# Patient Record
Sex: Male | Born: 1965 | Race: White | Hispanic: No | Marital: Married | Smoking: Current every day smoker
Health system: Southern US, Community
[De-identification: ages and names within clinical notes are randomized; demographics above are authoritative.]

## PROBLEM LIST (undated history)

## (undated) DIAGNOSIS — G4730 Sleep apnea, unspecified: Secondary | ICD-10-CM

## (undated) DIAGNOSIS — Z720 Tobacco use: Secondary | ICD-10-CM

## (undated) DIAGNOSIS — R0789 Other chest pain: Secondary | ICD-10-CM

## (undated) DIAGNOSIS — T7840XA Allergy, unspecified, initial encounter: Secondary | ICD-10-CM

## (undated) DIAGNOSIS — I10 Essential (primary) hypertension: Secondary | ICD-10-CM

## (undated) DIAGNOSIS — K219 Gastro-esophageal reflux disease without esophagitis: Secondary | ICD-10-CM

## (undated) DIAGNOSIS — M1990 Unspecified osteoarthritis, unspecified site: Secondary | ICD-10-CM

## (undated) DIAGNOSIS — E669 Obesity, unspecified: Secondary | ICD-10-CM

## (undated) DIAGNOSIS — A4902 Methicillin resistant Staphylococcus aureus infection, unspecified site: Secondary | ICD-10-CM

## (undated) DIAGNOSIS — F32A Depression, unspecified: Secondary | ICD-10-CM

## (undated) DIAGNOSIS — F329 Major depressive disorder, single episode, unspecified: Secondary | ICD-10-CM

## (undated) DIAGNOSIS — Z86010 Personal history of colonic polyps: Secondary | ICD-10-CM

## (undated) HISTORY — DX: Depression, unspecified: F32.A

## (undated) HISTORY — DX: Allergy, unspecified, initial encounter: T78.40XA

## (undated) HISTORY — DX: Major depressive disorder, single episode, unspecified: F32.9

## (undated) HISTORY — DX: Obesity, unspecified: E66.9

## (undated) HISTORY — DX: Other chest pain: R07.89

## (undated) HISTORY — DX: Essential (primary) hypertension: I10

## (undated) HISTORY — PX: TRIGGER FINGER RELEASE: SHX641

## (undated) HISTORY — DX: Tobacco use: Z72.0

## (undated) HISTORY — DX: Personal history of colonic polyps: Z86.010

---

## 2011-05-13 ENCOUNTER — Encounter (HOSPITAL_BASED_OUTPATIENT_CLINIC_OR_DEPARTMENT_OTHER)
Admission: RE | Admit: 2011-05-13 | Discharge: 2011-05-13 | Disposition: A | Discharge status: Home / Self Care | Payer: BC Managed Care – PPO | Source: Ambulatory Visit | Attending: Orthopedic Surgery | Admitting: Orthopedic Surgery

## 2011-05-13 LAB — BASIC METABOLIC PANEL
Calcium: 9.3 mg/dL (ref 8.4–10.5)
Creatinine, Ser: 0.93 mg/dL (ref 0.4–1.5)
GFR calc Af Amer: >60 mL/min (ref >60)
GFR calc non Af Amer: >60 mL/min (ref >60)
Sodium: 136 mEq/L (ref 135–145)

## 2011-05-14 ENCOUNTER — Ambulatory Visit (HOSPITAL_BASED_OUTPATIENT_CLINIC_OR_DEPARTMENT_OTHER)
Admission: RE | Admit: 2011-05-14 | Discharge: 2011-05-14 | Disposition: A | Discharge status: Home / Self Care | Payer: BC Managed Care – PPO | Source: Ambulatory Visit | Attending: Orthopedic Surgery | Admitting: Orthopedic Surgery

## 2011-05-14 DIAGNOSIS — Z01812 Encounter for preprocedural laboratory examination: Secondary | ICD-10-CM | POA: Insufficient documentation

## 2011-05-14 DIAGNOSIS — G4733 Obstructive sleep apnea (adult) (pediatric): Secondary | ICD-10-CM | POA: Insufficient documentation

## 2011-05-14 DIAGNOSIS — M6530 Trigger finger, unspecified finger: Secondary | ICD-10-CM | POA: Insufficient documentation

## 2011-05-14 DIAGNOSIS — M65839 Other synovitis and tenosynovitis, unspecified forearm: Secondary | ICD-10-CM | POA: Insufficient documentation

## 2011-05-14 DIAGNOSIS — K219 Gastro-esophageal reflux disease without esophagitis: Secondary | ICD-10-CM | POA: Insufficient documentation

## 2011-05-14 DIAGNOSIS — I10 Essential (primary) hypertension: Secondary | ICD-10-CM | POA: Insufficient documentation

## 2011-05-14 DIAGNOSIS — F17200 Nicotine dependence, unspecified, uncomplicated: Secondary | ICD-10-CM | POA: Insufficient documentation

## 2011-07-16 NOTE — Op Note (Signed)
  NAMEHIEN, PERREIRA             ACCOUNT NO.:  [id number]  MEDICAL RECORD NO.:  [id number]          PATIENT TYPE:  LOCATION:                                 FACILITY:  PHYSICIAN:  Cindee Salt, M.D.            DATE OF BIRTH:  DATE OF PROCEDURE:  05/14/2011 DATE OF DISCHARGE:                              OPERATIVE REPORT   PREOPERATIVE DIAGNOSIS:  Stenosing tenosynovitis, right middle finger.  POSTOPERATIVE DIAGNOSIS:  Stenosing tenosynovitis, right middle finger.  OPERATION:  Release A1 pulley, right middle finger.  SURGEON:  Cindee Salt, MD  ANESTHESIA:  Forearm based IV regional with local infiltration.  ANESTHESIOLOGIST:  Zenon Mayo, MD  HISTORY:  The patient is a 45 year old male with a history of triggering of his right middle finger.  This did not respond to conservative treatment.  Pre, peri, and postoperative courses have been discussed along with risks and complications.  He is aware there is no guarantee with the surgery, possibility of infection, recurrence, injury to arteries, nerves, tendons, incomplete relief of symptoms, and dystrophy. In the preoperative area, the patient is seen.  The extremity marked by both the patient and surgeon.  Antibiotics given.  PROCEDURE IN DETAIL:  The patient was brought to the operating room where a forearm based IV regional anesthetic was carried out without difficulty.  He was prepped using ChloraPrep, supine position, right arm free.  A 3-minute dry time was allowed.  Time-out taken confirming the patient and procedure.  An oblique incision was made over the A1 pulley of the right middle finger, carried down through the subcutaneous tissue.  Bleeders were electrocauterized.  Dissection was carried down protecting neurovascular bundles radially and ulnarly.  Retractors were placed.  The area of pressure in the tendon was immediately identified. The A1 pulley released.  Small incision made centrally in the A2  pulley. A very significant adherent tenosynovitis was present between superficialis profundus tendon.  This was partially excised.  The wound was copiously irrigated with saline.  The finger was placed through a full range of motion.  No further triggering was noted.  The wound was irrigated.  Skin was closed with interrupted 5-0 Vicryl Rapide sutures. A local infiltration was then given with 0.25% Marcaine without epinephrine, approximately 5 mL was used.  On deflation of the tourniquet, all fingers were immediately pinked.  After placement of a sterile compressive dressing with all the fingers free having been applied.  The patient tolerated the procedure well and was taken to the recovery room for observation in satisfactory condition.  He will be discharged home to return to the [facility] in 1 week on Vicodin.          ______________________________ Cindee Salt, M.D.     GK/MEDQ  D:  05/14/2011  T:  05/15/2011  Job:  [id number]  Electronically Signed by Cindee Salt M.D. on 07/16/2011 09:13:43 AM

## 2012-12-27 HISTORY — PX: MOUTH SURGERY: SHX715

## 2013-11-17 ENCOUNTER — Emergency Department (HOSPITAL_COMMUNITY): Payer: BC Managed Care – PPO

## 2013-11-17 ENCOUNTER — Encounter (HOSPITAL_COMMUNITY): Payer: Self-pay | Admitting: Emergency Medicine

## 2013-11-17 ENCOUNTER — Encounter (HOSPITAL_COMMUNITY): Payer: BC Managed Care – PPO | Admitting: Anesthesiology

## 2013-11-17 ENCOUNTER — Emergency Department (HOSPITAL_COMMUNITY): Payer: BC Managed Care – PPO | Admitting: Anesthesiology

## 2013-11-17 ENCOUNTER — Inpatient Hospital Stay (HOSPITAL_COMMUNITY)
Admission: EM | Admit: 2013-11-17 | Discharge: 2013-11-25 | DRG: 494 | Disposition: A | Discharge status: Home Health | Payer: BC Managed Care – PPO | Attending: Emergency Medicine | Admitting: Emergency Medicine

## 2013-11-17 ENCOUNTER — Encounter (HOSPITAL_COMMUNITY): Admission: EM | Disposition: A | Discharge status: Home Health | Payer: Self-pay | Source: Home / Self Care

## 2013-11-17 DIAGNOSIS — Z4789 Encounter for other orthopedic aftercare: Secondary | ICD-10-CM

## 2013-11-17 DIAGNOSIS — Z79899 Other long term (current) drug therapy: Secondary | ICD-10-CM

## 2013-11-17 DIAGNOSIS — F17200 Nicotine dependence, unspecified, uncomplicated: Secondary | ICD-10-CM | POA: Diagnosis present

## 2013-11-17 DIAGNOSIS — W138XXA Fall from, out of or through other building or structure, initial encounter: Secondary | ICD-10-CM | POA: Diagnosis present

## 2013-11-17 DIAGNOSIS — S82109A Unspecified fracture of upper end of unspecified tibia, initial encounter for closed fracture: Principal | ICD-10-CM | POA: Diagnosis present

## 2013-11-17 DIAGNOSIS — S82143A Displaced bicondylar fracture of unspecified tibia, initial encounter for closed fracture: Secondary | ICD-10-CM

## 2013-11-17 DIAGNOSIS — S82141A Displaced bicondylar fracture of right tibia, initial encounter for closed fracture: Secondary | ICD-10-CM

## 2013-11-17 DIAGNOSIS — I10 Essential (primary) hypertension: Secondary | ICD-10-CM | POA: Diagnosis present

## 2013-11-17 HISTORY — PX: EXTERNAL FIXATION LEG: SHX1549

## 2013-11-17 HISTORY — DX: Essential (primary) hypertension: I10

## 2013-11-17 LAB — CBC WITH DIFFERENTIAL/PLATELET
Basophils Absolute: 0 10*3/uL (ref 0.0–0.1)
Eosinophils Absolute: 0.1 10*3/uL (ref 0.0–0.7)
Lymphocytes Relative: 13 % (ref 12–46)
Lymphs Abs: 1.8 10*3/uL (ref 0.7–4.0)
Neutrophils Relative %: 81 % — ABNORMAL HIGH (ref 43–77)
Platelets: 216 10*3/uL (ref 150–400)
RBC: 4.8 MIL/uL (ref 4.22–5.81)
RDW: 12.6 % (ref 11.5–15.5)
WBC: 14 10*3/uL — ABNORMAL HIGH (ref 4.0–10.5)

## 2013-11-17 LAB — BASIC METABOLIC PANEL
CO2: 22 mEq/L (ref 19–32)
Calcium: 8.7 mg/dL (ref 8.4–10.5)
GFR calc non Af Amer: 78 mL/min — ABNORMAL LOW (ref >90)
Glucose, Bld: 97 mg/dL (ref 70–99)
Potassium: 3.9 mEq/L (ref 3.5–5.1)
Sodium: 135 mEq/L (ref 135–145)

## 2013-11-17 SURGERY — EXTERNAL FIXATION, LOWER EXTREMITY
Anesthesia: General | Site: Leg Upper | Laterality: Right | Wound class: Clean

## 2013-11-17 MED ORDER — ONDANSETRON HCL 4 MG/2ML IJ SOLN
INTRAMUSCULAR | Status: DC | PRN
  Administered 2013-11-17: 4 mg via INTRAVENOUS

## 2013-11-17 MED ORDER — ROCURONIUM BROMIDE 100 MG/10ML IV SOLN
INTRAVENOUS | Status: AC
  Filled 2013-11-17: qty 1

## 2013-11-17 MED ORDER — 0.9 % SODIUM CHLORIDE (POUR BTL) OPTIME
TOPICAL | Status: DC | PRN
  Administered 2013-11-17: 1000 mL

## 2013-11-17 MED ORDER — EPHEDRINE SULFATE 50 MG/ML IJ SOLN
INTRAMUSCULAR | Status: DC | PRN
  Administered 2013-11-17: 10 mg via INTRAVENOUS

## 2013-11-17 MED ORDER — LISINOPRIL 5 MG PO TABS
5.0000 mg | ORAL_TABLET | Freq: Every morning | ORAL | Status: DC
  Administered 2013-11-18 – 2013-11-25 (×7): 5 mg via ORAL
  Filled 2013-11-17 (×8): qty 1

## 2013-11-17 MED ORDER — MIDAZOLAM HCL 2 MG/2ML IJ SOLN
INTRAMUSCULAR | Status: AC
  Filled 2013-11-17: qty 2

## 2013-11-17 MED ORDER — DIPHENHYDRAMINE HCL 12.5 MG/5ML PO ELIX: 25.0000 mg | ORAL_SOLUTION | ORAL | Status: DC | PRN

## 2013-11-17 MED ORDER — CEFAZOLIN SODIUM-DEXTROSE 2-3 GM-% IV SOLR
2.0000 g | Freq: Three times a day (TID) | INTRAVENOUS | Status: DC
  Administered 2013-11-17: 2 g via INTRAVENOUS
  Filled 2013-11-17 (×3): qty 50

## 2013-11-17 MED ORDER — METOCLOPRAMIDE HCL 10 MG PO TABS: 5.0000 mg | ORAL_TABLET | Freq: Three times a day (TID) | ORAL | Status: DC | PRN

## 2013-11-17 MED ORDER — MIDAZOLAM HCL 5 MG/5ML IJ SOLN
INTRAMUSCULAR | Status: DC | PRN
  Administered 2013-11-17: 2 mg via INTRAVENOUS

## 2013-11-17 MED ORDER — HYDROMORPHONE HCL PF 1 MG/ML IJ SOLN
1.0000 mg | Freq: Once | INTRAMUSCULAR | Status: AC
  Administered 2013-11-17: 1 mg via INTRAVENOUS
  Filled 2013-11-17: qty 1

## 2013-11-17 MED ORDER — MORPHINE SULFATE 2 MG/ML IJ SOLN
1.0000 mg | INTRAMUSCULAR | Status: DC | PRN
  Administered 2013-11-17 – 2013-11-20 (×4): 1 mg via INTRAVENOUS
  Filled 2013-11-17 (×4): qty 1

## 2013-11-17 MED ORDER — LABETALOL HCL 5 MG/ML IV SOLN
10.0000 mg | INTRAVENOUS | Status: AC | PRN
  Administered 2013-11-17 (×2): 2.5 mg via INTRAVENOUS

## 2013-11-17 MED ORDER — FENTANYL CITRATE 0.05 MG/ML IJ SOLN
INTRAMUSCULAR | Status: DC | PRN
  Administered 2013-11-17: 150 ug via INTRAVENOUS
  Administered 2013-11-17: 100 ug via INTRAVENOUS

## 2013-11-17 MED ORDER — PROMETHAZINE HCL 25 MG/ML IJ SOLN: 6.2500 mg | INTRAMUSCULAR | Status: DC | PRN

## 2013-11-17 MED ORDER — SODIUM CHLORIDE 0.9 % IV SOLN
INTRAVENOUS | Status: DC
  Administered 2013-11-17 – 2013-11-19 (×4): via INTRAVENOUS

## 2013-11-17 MED ORDER — LIDOCAINE HCL (CARDIAC) 20 MG/ML IV SOLN
INTRAVENOUS | Status: AC
  Filled 2013-11-17: qty 5

## 2013-11-17 MED ORDER — SUCCINYLCHOLINE CHLORIDE 20 MG/ML IJ SOLN
INTRAMUSCULAR | Status: AC
  Filled 2013-11-17: qty 2

## 2013-11-17 MED ORDER — ASPIRIN EC 325 MG PO TBEC
325.0000 mg | DELAYED_RELEASE_TABLET | Freq: Two times a day (BID) | ORAL | Status: DC
  Administered 2013-11-18 – 2013-11-19 (×4): 325 mg via ORAL
  Filled 2013-11-17 (×7): qty 1

## 2013-11-17 MED ORDER — NALOXONE NEWBORN-WH INJECTION 0.4 MG/ML
INTRAMUSCULAR | Status: DC | PRN
  Administered 2013-11-17: .08 mg via INTRAMUSCULAR

## 2013-11-17 MED ORDER — CEFAZOLIN SODIUM-DEXTROSE 2-3 GM-% IV SOLR
INTRAVENOUS | Status: AC
  Filled 2013-11-17: qty 50

## 2013-11-17 MED ORDER — ONDANSETRON HCL 4 MG/2ML IJ SOLN
INTRAMUSCULAR | Status: AC
  Filled 2013-11-17: qty 2

## 2013-11-17 MED ORDER — PROPOFOL 10 MG/ML IV BOLUS
INTRAVENOUS | Status: DC | PRN
  Administered 2013-11-17: 200 mg via INTRAVENOUS

## 2013-11-17 MED ORDER — MAGNESIUM CITRATE PO SOLN: 1.0000 | Freq: Once | ORAL | Status: AC | PRN

## 2013-11-17 MED ORDER — OXYCODONE HCL 5 MG PO TABS
5.0000 mg | ORAL_TABLET | ORAL | Status: DC | PRN
  Administered 2013-11-18: 15:00:00 10 mg via ORAL
  Filled 2013-11-17: qty 2

## 2013-11-17 MED ORDER — SODIUM CHLORIDE 0.9 % IV BOLUS (SEPSIS)
500.0000 mL | Freq: Once | INTRAVENOUS | Status: AC
  Administered 2013-11-17: 500 mL via INTRAVENOUS

## 2013-11-17 MED ORDER — HYDROMORPHONE HCL PF 1 MG/ML IJ SOLN
0.2500 mg | INTRAMUSCULAR | Status: DC | PRN
  Administered 2013-11-17 (×4): 0.5 mg via INTRAVENOUS

## 2013-11-17 MED ORDER — CEFAZOLIN SODIUM-DEXTROSE 2-3 GM-% IV SOLR
2.0000 g | Freq: Four times a day (QID) | INTRAVENOUS | Status: AC
Start: 2013-11-17 — End: 2013-11-18
  Administered 2013-11-18 (×3): 2 g via INTRAVENOUS
  Filled 2013-11-17 (×3): qty 50

## 2013-11-17 MED ORDER — NALOXONE HCL 0.4 MG/ML IJ SOLN
INTRAMUSCULAR | Status: AC
  Filled 2013-11-17: qty 1

## 2013-11-17 MED ORDER — METHOCARBAMOL 100 MG/ML IJ SOLN
500.0000 mg | Freq: Four times a day (QID) | INTRAVENOUS | Status: DC | PRN
  Administered 2013-11-17 – 2013-11-18 (×2): 500 mg via INTRAVENOUS
  Filled 2013-11-17 (×2): qty 5

## 2013-11-17 MED ORDER — POLYETHYLENE GLYCOL 3350 17 G PO PACK
17.0000 g | PACK | Freq: Every day | ORAL | Status: DC | PRN
  Administered 2013-11-18 – 2013-11-20 (×2): 17 g via ORAL
  Filled 2013-11-17 (×2): qty 1

## 2013-11-17 MED ORDER — LACTATED RINGERS IV SOLN
INTRAVENOUS | Status: DC | PRN
  Administered 2013-11-17: 17:00:00 via INTRAVENOUS

## 2013-11-17 MED ORDER — LABETALOL HCL 5 MG/ML IV SOLN
INTRAVENOUS | Status: AC
  Administered 2013-11-17: 5 mg
  Filled 2013-11-17: qty 4

## 2013-11-17 MED ORDER — SENNA 8.6 MG PO TABS
1.0000 | ORAL_TABLET | Freq: Two times a day (BID) | ORAL | Status: DC
  Administered 2013-11-17 – 2013-11-25 (×12): 8.6 mg via ORAL
  Filled 2013-11-17 (×14): qty 1

## 2013-11-17 MED ORDER — ROCURONIUM BROMIDE 100 MG/10ML IV SOLN
INTRAVENOUS | Status: DC | PRN
  Administered 2013-11-17: 2 mg via INTRAVENOUS

## 2013-11-17 MED ORDER — SUCCINYLCHOLINE CHLORIDE 20 MG/ML IJ SOLN
INTRAMUSCULAR | Status: AC
  Filled 2013-11-17: qty 1

## 2013-11-17 MED ORDER — HYDROCODONE-ACETAMINOPHEN 5-325 MG PO TABS
1.0000 | ORAL_TABLET | ORAL | Status: DC | PRN
  Administered 2013-11-17 – 2013-11-19 (×8): 2 via ORAL
  Filled 2013-11-17 (×8): qty 2

## 2013-11-17 MED ORDER — LIDOCAINE HCL (PF) 2 % IJ SOLN
INTRAMUSCULAR | Status: DC | PRN
  Administered 2013-11-17: 75 mg via INTRADERMAL

## 2013-11-17 MED ORDER — METHOCARBAMOL 500 MG PO TABS
500.0000 mg | ORAL_TABLET | Freq: Four times a day (QID) | ORAL | Status: DC | PRN
  Administered 2013-11-18 – 2013-11-22 (×7): 500 mg via ORAL
  Filled 2013-11-17 (×7): qty 1

## 2013-11-17 MED ORDER — SUCCINYLCHOLINE CHLORIDE 20 MG/ML IJ SOLN
INTRAMUSCULAR | Status: DC | PRN
  Administered 2013-11-17: 160 mg via INTRAVENOUS

## 2013-11-17 MED ORDER — HYDROMORPHONE HCL PF 1 MG/ML IJ SOLN
INTRAMUSCULAR | Status: AC
  Filled 2013-11-17: qty 1

## 2013-11-17 MED ORDER — PROPOFOL 10 MG/ML IV BOLUS
INTRAVENOUS | Status: AC
  Filled 2013-11-17: qty 20

## 2013-11-17 MED ORDER — ONDANSETRON HCL 4 MG/2ML IJ SOLN: 4.0000 mg | Freq: Four times a day (QID) | INTRAMUSCULAR | Status: DC | PRN

## 2013-11-17 MED ORDER — SORBITOL 70 % SOLN
30.0000 mL | Freq: Every day | Status: DC | PRN
  Filled 2013-11-17: qty 30

## 2013-11-17 MED ORDER — FENTANYL CITRATE 0.05 MG/ML IJ SOLN
INTRAMUSCULAR | Status: AC
  Filled 2013-11-17: qty 5

## 2013-11-17 MED ORDER — FENTANYL CITRATE 0.05 MG/ML IJ SOLN: 25.0000 ug | INTRAMUSCULAR | Status: DC | PRN

## 2013-11-17 MED ORDER — PANTOPRAZOLE SODIUM 40 MG PO TBEC
40.0000 mg | DELAYED_RELEASE_TABLET | Freq: Every day | ORAL | Status: DC
  Administered 2013-11-18 – 2013-11-23 (×6): 40 mg via ORAL
  Filled 2013-11-17 (×7): qty 1

## 2013-11-17 MED ORDER — ONDANSETRON HCL 4 MG PO TABS: 4.0000 mg | ORAL_TABLET | Freq: Four times a day (QID) | ORAL | Status: DC | PRN

## 2013-11-17 MED ORDER — ONDANSETRON HCL 4 MG/2ML IJ SOLN
4.0000 mg | Freq: Once | INTRAMUSCULAR | Status: DC
  Filled 2013-11-17: qty 2

## 2013-11-17 MED ORDER — METOCLOPRAMIDE HCL 5 MG/ML IJ SOLN: 5.0000 mg | Freq: Three times a day (TID) | INTRAMUSCULAR | Status: DC | PRN

## 2013-11-17 SURGICAL SUPPLY — 24 items
3.5 DRILL ×2 IMPLANT
5x401/2 pin long IMPLANT
5x401/2 pin short IMPLANT
BANDAGE GAUZE ELAST BULKY 4 IN (GAUZE/BANDAGES/DRESSINGS) ×2 IMPLANT
BAR EXFIX SM BONE 10.5X400 (Trauma) ×4 IMPLANT
BIT DRILL 3.5 SHOFT HALF PIN (BIT) ×2 IMPLANT
CLAMP BAR TO BAR (Clamp) ×4 IMPLANT
CLAMP PIN TO BAR (Clamp) ×8 IMPLANT
DRAPE C-ARMOR (DRAPES) ×2 IMPLANT
DRAPE ORTHO SPLIT 77X108 STRL (DRAPES) ×2
DRAPE SURG ORHT 6 SPLT 77X108 (DRAPES) ×2 IMPLANT
DRAPE U-SHAPE 47X51 STRL (DRAPES) ×2 IMPLANT
GAUZE XEROFORM 5X9 LF (GAUZE/BANDAGES/DRESSINGS) ×2 IMPLANT
GOWN PREVENTION PLUS XXLARGE (GOWN DISPOSABLE) ×2 IMPLANT
GOWN SRG XL XLNG 56XLVL 4 (GOWN DISPOSABLE) ×1 IMPLANT
GOWN STRL NON-REIN LRG LVL3 (GOWN DISPOSABLE) ×2 IMPLANT
GOWN STRL NON-REIN XL XLG LVL4 (GOWN DISPOSABLE) ×1
NDL SAFETY ECLIPSE 18X1.5 (NEEDLE) ×1 IMPLANT
NEEDLE HYPO 18GX1.5 SHARP (NEEDLE) ×1
PACK TOTAL JOINT (CUSTOM PROCEDURE TRAY) ×2 IMPLANT
PIN HALF 5X40 (EXFIX) ×8 IMPLANT
SYR 50ML LL SCALE MARK (SYRINGE) ×2 IMPLANT
TOWEL NATURAL 10PK STERILE (DISPOSABLE) ×4 IMPLANT
TOWEL OR NON WOVEN STRL DISP B (DISPOSABLE) ×2 IMPLANT

## 2013-11-17 NOTE — ED Notes (Addendum)
Pt was on stepping from ladder onto roof and ladder moved. Pt fell 10 feet to ground. Bumped air conditioner on during fall. Pt has deformed R knee. Denies hitting head or LOC. States he landed on feet. EMS gave pt fentanyl and 4mg  zofran. Pt

## 2013-11-17 NOTE — Op Note (Signed)
   Date of Surgery: 11/17/2013  INDICATIONS: Roger Ayers is a 47 y.o.-year-old male who sustained a right tibial plateau fracture; he was indicated for external fixation due to the displaced and unstable nature of the fracture and came to the operating room today for this procedure. The patient did consent to the procedure after discussion of the risks and benefits.   PREOPERATIVE DIAGNOSIS: right bicondylar tibial plateau fracture   POSTOPERATIVE DIAGNOSIS: Same.  PROCEDURE: External fixation right tibial plateau fracture multiplane;  SURGEON: Roger Ayers, M.D.  ASSIST: none,   ANESTHESIA: general  IV FLUIDS AND URINE: See anesthesia.  ESTIMATED BLOOD LOSS: minimal mL.  IMPLANTS: S&N 5.0 mm Schanz pins and external fixation bars and clamps  DRAINS: None.  COMPLICATIONS: None.  DESCRIPTION OF PROCEDURE: The patient was brought to the operating room and placed supine on the operating table.  The patient had been signed prior to the procedure.  The patient had the anesthesia placed by the anesthesiologist.  The prep verification and incision time-outs were performed to confirm that this was the correct patient, site, side and location. The patient had SCDs in place on the opposite lower extremity. The patient did receive antibiotics prior to the incision and was re-dosed during the procedure as needed at indicated intervals.  The lower extremity was prepped and draped in the standard fashion.  The bony landmarks were palpated and the pin sites were marked on the skin. Each Schanz pin was placed in the same fashion -- first drilling with the 3.5 mm drill while copiously irrigating, then hand placing the pin.  This was confirmed on x-ray on both views.  The ex-fix clamps were placed onto pins and the fracture was pulled into the proper alignment.  The clamps were completely tightened.   Final x-rays were taken in AP and lateral views to confirm the reduction and pin lengths. The wounds  were cleaned and dried a final time and a sterile dressing consisting of Xeroform and ABDs was placed. The patient was then transferred back to the bed and left the operating room in stable condition.  All sponge and instrument counts were correct.  POSTOPERATIVE PLAN: Roger Ayers will remain non weight bearing with the leg elevated.  he will return to the operating room for definitive fixation when the swelling has gone down.  Roger Ayers will receive DVT prophylaxis based on other medications, activity level, and risk ratio of bleeding to thrombosis.  Pin site care will be initiated on postoperative day one.

## 2013-11-17 NOTE — Anesthesia Postprocedure Evaluation (Signed)
  Anesthesia Post-op Note  Patient: Roger Ayers  Procedure(s) Performed: Procedure(s) (LRB): EXTERNAL FIXATION LEG (Right)  Patient Location: PACU  Anesthesia Type: General  Level of Consciousness: awake and alert   Airway and Oxygen Therapy: Patient Spontanous Breathing  Post-op Pain: mild  Post-op Assessment: Post-op Vital signs reviewed, Patient's Cardiovascular Status Stable, Respiratory Function Stable, Patent Airway and No signs of Nausea or vomiting  Last Vitals:  Filed Vitals:   11/17/13 1945  BP: 158/91  Pulse: 95  Temp:   Resp: 10    Post-op Vital Signs: stable   Complications: No apparent anesthesia complications. To floor with sleep apnea precautions and CPAP.

## 2013-11-17 NOTE — Transfer of Care (Signed)
Immediate Anesthesia Transfer of Care Note  Patient: Roger Ayers  Procedure(s) Performed: Procedure(s): EXTERNAL FIXATION LEG (Right)  Patient Location: PACU  Anesthesia Type:General  Level of Consciousness: sedated  Airway & Oxygen Therapy: Patient Spontanous Breathing and Patient connected to face mask oxygen  Post-op Assessment: Report given to PACU RN and Post -op Vital signs reviewed and stable  Post vital signs: Reviewed and stable  Complications: No apparent anesthesia complications

## 2013-11-17 NOTE — Anesthesia Preprocedure Evaluation (Addendum)
Anesthesia Evaluation  Patient identified by MRN, date of birth, ID band Patient awake    Reviewed: Allergy & Precautions, H&P , NPO status , Patient's Chart, lab work & pertinent test results  Airway Mallampati: II TM Distance: >3 FB Neck ROM: Full    Dental no notable dental hx.    Pulmonary sleep apnea and Continuous Positive Airway Pressure Ventilation , Current Smoker,  CXR: No active disease. breath sounds clear to auscultation  Pulmonary exam normal       Cardiovascular hypertension, Pt. on medications negative cardio ROS  Rhythm:Regular Rate:Normal     Neuro/Psych negative neurological ROS  negative psych ROS   GI/Hepatic negative GI ROS, Neg liver ROS,   Endo/Other  negative endocrine ROS  Renal/GU negative Renal ROS  negative genitourinary   Musculoskeletal negative musculoskeletal ROS (+)   Abdominal   Peds negative pediatric ROS (+)  Hematology negative hematology ROS (+)   Anesthesia Other Findings   Reproductive/Obstetrics negative OB ROS                          Anesthesia Physical Anesthesia Plan  ASA: II and emergent  Anesthesia Plan: General   Post-op Pain Management:    Induction: Intravenous  Airway Management Planned: Oral ETT  Additional Equipment:   Intra-op Plan:   Post-operative Plan: Extubation in OR  Informed Consent: I have reviewed the patients History and Physical, chart, labs and discussed the procedure including the risks, benefits and alternatives for the proposed anesthesia with the patient or authorized representative who has indicated his/her understanding and acceptance.   Dental advisory given  Plan Discussed with: CRNA  Anesthesia Plan Comments:         Anesthesia Quick Evaluation

## 2013-11-17 NOTE — ED Notes (Signed)
Bed: WA20 Expected date:  Expected time:  Means of arrival:  Comments: EMS-fall from ladder-knee deformity

## 2013-11-17 NOTE — Progress Notes (Signed)
Spoke with pt regarding cpap.  Pt brought in his cpap unit from home with tubing and nasal pillows.   Machine turned on and appears to be in working order.  No frays on cord or obvious defects noted at this time.  Pt stated he feels comfortable placing it on himself later when ready.  Pt was advised that RT is available all night should he need further assistance to let his nurse know.  RN aware.  Call will be placed through service response requesting [employer] inspect home cpap machine.

## 2013-11-17 NOTE — ED Notes (Signed)
Patient transported to X-ray 

## 2013-11-17 NOTE — Consult Note (Signed)
ORTHOPAEDIC CONSULTATION  REQUESTING PHYSICIAN: Roney Marion, MD  Chief Complaint: Right knee injury  HPI: Roger Ayers is a 47 y.o. male who complains of right knee pain s/p fall from 10 ft from ladder and landing on his feet.  Had immediate onset of pain and inability to bear weight.  Denies any other complaints.  Denies LOC, neck pain, abd pain.  Past Medical History  Diagnosis Date  . Hypertension    Past Surgical History  Procedure Laterality Date  . Mouth surgery    . Trigger finger release     History   Social History  . Marital Status: Unknown    Spouse Name: N/A    Number of Children: N/A  . Years of Education: N/A   Social History Main Topics  . Smoking status: Current Every Day Smoker  . Smokeless tobacco: None  . Alcohol Use: Yes  . Drug Use: No  . Sexual Activity: None   Other Topics Concern  . None   Social History Narrative  . None   History reviewed. No pertinent family history. No Known Allergies Prior to Admission medications   Medication Sig Start Date End Date Taking? Authorizing Provider  lisinopril (PRINIVIL,ZESTRIL) 5 MG tablet Take 5 mg by mouth every morning.   Yes Historical Provider, MD  omeprazole (PRILOSEC) 20 MG capsule Take 20 mg by mouth every morning.   Yes Historical Provider, MD   Ct Knee Right Wo Contrast  11/17/2013   CLINICAL DATA:  Right knee tibial plateau fracture  EXAM: CT OF THE RIGHT KNEE WITHOUT CONTRAST  TECHNIQUE: Multidetector CT imaging was performed according to the standard protocol. Multiplanar CT image reconstructions were also generated.  COMPARISON:  Radiographic series dated 11/17/2013  FINDINGS: A comminuted fracture is identified involving the lateral tibial plateau. Areas of distraction appreciated maximal diameter of 3.5 cm. There are areas of depression in the posterior aspect of fracture remaining tooth of approximately 1 cm. The fracture extends into the tibial spine region as well as into the medial  tibial plateau region. The medial tibial plateau fracture is also comminuted and demonstrates minimal depression of 2-3 mm. There is a vertical component extending into the proximal tibial shaft. An impacted comminuted fibular head fracture is appreciated. There does not appear to be significant displacement.  IMPRESSION: 1. Lateral and medial tibial plateau fractures with distraction and areas of depression. 2. Comminuted fibular head fracture.   Electronically Signed   By: Salome Holmes M.D.   On: 11/17/2013 16:16   Dg Chest Port 1 View  11/17/2013   CLINICAL DATA:  Hypertension.  EXAM: PORTABLE CHEST - 1 VIEW  COMPARISON:  None.  FINDINGS: The heart size and mediastinal contours are within normal limits. Both lungs are clear. The visualized skeletal structures are unremarkable.  IMPRESSION: No active disease.   Electronically Signed   By: Roque Lias M.D.   On: 11/17/2013 15:35   Dg Knee Complete 4 Views Right  11/17/2013   CLINICAL DATA:  History of trauma.  EXAM: RIGHT KNEE - COMPLETE 4+ VIEW  COMPARISON:  None.  FINDINGS: A comminuted lateral tibial plateau fracture identified demonstrating a next anterior/ is lateral displacement, 3 cm of distraction, mild depression, intra-articular extension is. The fracture line appears extending from the lateral tibial spine to the peripheral cortical base. An impacted comminuted fracture identified involving the proximal fibula.  IMPRESSION: 1. Lateral tibial plateau fracture 2. Comminuted impacted proximal fibular head fracture.   Electronically Signed  By: Salome Holmes M.D.   On: 11/17/2013 14:39   Dg Hand Complete Left  11/17/2013   CLINICAL DATA:  Fall.  EXAM: LEFT HAND - COMPLETE 3+ VIEW  COMPARISON:  None.  FINDINGS: There is no evidence of fracture or dislocation. There is no evidence of arthropathy or other focal bone abnormality. Soft tissues are unremarkable.  IMPRESSION: Negative.   Electronically Signed   By: Charlett Nose M.D.   On:  11/17/2013 16:31    Positive ROS: All other systems have been reviewed and were otherwise negative with the exception of those mentioned in the HPI and as above.  Physical Exam: General: Alert, no acute distress Cardiovascular: No pedal edema Respiratory: No cyanosis, no use of accessory musculature GI: No organomegaly, abdomen is soft and non-tender Skin: No lesions in the area of chief complaint Neurologic: Sensation intact distally Psychiatric: Patient is competent for consent with normal mood and affect Lymphatic: No axillary or cervical lymphadenopathy  MUSCULOSKELETAL:   RLE: Compartment soft Deformity of knee NVI distally  Assessment: Right bicondylar tibial plateau fracture  Plan: - NPO - plan for ex fix today - will admit after surgery - will plan for definitive fixation next week - discussed r/b/a to surgery and patient elects to proceed with surgery  Thank you for the consult and the opportunity to see Roger Ayers. Glee Arvin, MD [facility] [phone] 4:47 PM

## 2013-11-17 NOTE — ED Notes (Signed)
MD at bedside. 

## 2013-11-17 NOTE — ED Provider Notes (Signed)
CSN: [id number]     Arrival date & time 11/17/13  1404 History   First MD Initiated Contact with Patient 11/17/13 1417     Chief Complaint  Patient presents with  . Knee Injury  . Fall   (Consider location/radiation/quality/duration/timing/severity/associated sxs/prior Treatment) HPI   Naseer Hearn is a 47 y.o. male brought in by EMS with severe pain to the right knee after patient fell 10 feet off a roof when his ladder slipped. Initially the patient impacted a wooden fence, he then came into contact contact with landscape ground which consisted of rocks and dirt. Patient rates his pain as 7/10, EMS gave 200 mcg of fentanyl in route. Said that this helped significantly. Patient denies any head trauma, LOC, nausea vomiting, change in vision, cervicalgia, chest pain, shortness of breath, abdominal pain.   Past Medical History  Diagnosis Date  . Hypertension    Past Surgical History  Procedure Laterality Date  . Mouth surgery    . Trigger finger release     History reviewed. No pertinent family history. History  Substance Use Topics  . Smoking status: Current Every Day Smoker  . Smokeless tobacco: Not on file  . Alcohol Use: Yes    Review of Systems  10 systems reviewed and found to be negative, except as noted in the HPI    Allergies  Review of patient's allergies indicates no known allergies.  Home Medications   Current Outpatient Rx  Name  Route  Sig  Dispense  Refill  . lisinopril (PRINIVIL,ZESTRIL) 5 MG tablet   Oral   Take 5 mg by mouth every morning.         Marland Kitchen omeprazole (PRILOSEC) 20 MG capsule   Oral   Take 20 mg by mouth every morning.          BP 138/76  Pulse 80  Temp(Src) 98.3 F (36.8 C) (Oral)  Resp 16  SpO2 95% Physical Exam  Nursing note and vitals reviewed. Constitutional: He is oriented to person, place, and time. He appears well-developed and well-nourished. No distress.  HENT:  Head: Normocephalic and atraumatic.  Mouth/Throat:  Oropharynx is clear and moist.  Eyes: Conjunctivae and EOM are normal. Pupils are equal, round, and reactive to light.  Neck: Normal range of motion.  No midline tenderness to palpation or step-offs appreciated. Patient has full range of motion without pain.   Cardiovascular: Normal rate, regular rhythm and normal heart sounds.   Pulmonary/Chest: Effort normal and breath sounds normal. No stridor. No respiratory distress. He has no wheezes. He has no rales. He exhibits no tenderness.  No ecchymoses or abrasions, no tenderness to palpation or crepitance.  Abdominal: Soft. Bowel sounds are normal. He exhibits no distension and no mass. There is no tenderness. There is no rebound and no guarding.  No ecchymoses or abrasion, nontender to palpation. No pain with pelvic rocking  Musculoskeletal: Normal range of motion.  Swelling and tenderness to palpation at the area just inferior to the right knee on the lateral side. Patient has excellent range of motion to toes, cap refill is less than 2 seconds and dorsalis pedis is 2+ bilaterally.  Compartment is nontender.  Neurological: He is alert and oriented to person, place, and time.  Psychiatric: He has a normal mood and affect.    ED Course  Procedures (including critical care time) Labs Review Labs Reviewed  CBC WITH DIFFERENTIAL - Abnormal; Notable for the following:    WBC 14.0 (*)    Neutrophils  Relative % 81 (*)    Neutro Abs 11.4 (*)    All other components within normal limits  BASIC METABOLIC PANEL - Abnormal; Notable for the following:    GFR calc non Af Amer 78 (*)    All other components within normal limits  BASIC METABOLIC PANEL - Abnormal; Notable for the following:    Glucose, Bld 156 (*)    GFR calc non Af Amer 83 (*)    All other components within normal limits   Imaging Review Dg Knee Complete 4 Views Right  11/17/2013   CLINICAL DATA:  History of trauma.  EXAM: RIGHT KNEE - COMPLETE 4+ VIEW  COMPARISON:  None.   FINDINGS: A comminuted lateral tibial plateau fracture identified demonstrating a next anterior/ is lateral displacement, 3 cm of distraction, mild depression, intra-articular extension is. The fracture line appears extending from the lateral tibial spine to the peripheral cortical base. An impacted comminuted fracture identified involving the proximal fibula.  IMPRESSION: 1. Lateral tibial plateau fracture 2. Comminuted impacted proximal fibular head fracture.   Electronically Signed   By: Salome Holmes M.D.   On: 11/17/2013 14:39    EKG Interpretation   None       MDM   1. Tibial plateau fracture, right, closed, initial encounter      Filed Vitals:   11/17/13 1410  BP: 138/76  Pulse: 80  Temp: 98.3 F (36.8 C)  TempSrc: Oral  Resp: 16  SpO2: 95%     Stan Cantave is a 47 y.o. male  with right-sided tibial plateau fracture status post fall off a roof. And orthopedic consult from Dr. Roda Shutters appreciated: He recommends obtaining CT scan he will come in to evaluate the patient. Case signed out to PA Parker at shift change.   Medications  ondansetron (ZOFRAN) injection 4 mg (not administered)  HYDROmorphone (DILAUDID) injection 1 mg (1 mg Intravenous Given 11/17/13 1503)  sodium chloride 0.9 % bolus 500 mL (500 mLs Intravenous New Bag/Given 11/17/13 1503)    Note: Portions of this report may have been transcribed using voice recognition software. Every effort was made to ensure accuracy; however, inadvertent computerized transcription errors may be present     Wynetta Emery, PA-C 11/18/13 [phone]

## 2013-11-18 LAB — BASIC METABOLIC PANEL
BUN: 10 mg/dL (ref 6–23)
Creatinine, Ser: 1.05 mg/dL (ref 0.50–1.35)
GFR calc Af Amer: >90 mL/min (ref >90)
GFR calc non Af Amer: 83 mL/min — ABNORMAL LOW (ref >90)
Glucose, Bld: 156 mg/dL — ABNORMAL HIGH (ref 70–99)
Potassium: 3.8 mEq/L (ref 3.5–5.1)
Sodium: 136 mEq/L (ref 135–145)

## 2013-11-18 MED ORDER — HYDROMORPHONE HCL 2 MG PO TABS
2.0000 mg | ORAL_TABLET | ORAL | Status: DC | PRN
  Administered 2013-11-18 – 2013-11-21 (×18): 4 mg via ORAL
  Administered 2013-11-21: 16:00:00 2 mg via ORAL
  Administered 2013-11-22 – 2013-11-24 (×15): 4 mg via ORAL
  Filled 2013-11-18 (×35): qty 2

## 2013-11-18 MED ORDER — HYDRALAZINE HCL 20 MG/ML IJ SOLN
10.0000 mg | INTRAMUSCULAR | Status: DC | PRN
  Administered 2013-11-18 – 2013-11-20 (×3): 10 mg via INTRAVENOUS
  Filled 2013-11-18 (×3): qty 1

## 2013-11-18 MED ORDER — KETOROLAC TROMETHAMINE 15 MG/ML IJ SOLN
15.0000 mg | Freq: Four times a day (QID) | INTRAMUSCULAR | Status: AC
  Administered 2013-11-18 (×4): 15 mg via INTRAVENOUS
  Filled 2013-11-18 (×4): qty 1

## 2013-11-18 NOTE — Progress Notes (Signed)
Pt seen, offered him assistance with his home cpap machine.  Pt stated he was fine and can place it on himself later when ready.  RN notified.

## 2013-11-18 NOTE — Progress Notes (Signed)
   Subjective:  Patient reports pain as moderate.  Had hypertension overnight.  Objective:   VITALS:   Filed Vitals:   11/18/13 0037 11/18/13 0228 11/18/13 0500 11/18/13 1007  BP: 185/111 178/106 153/88 163/69  Pulse: 72  64   Temp: 98.7 F (37.1 C)  98.2 F (36.8 C)   TempSrc: Oral  Oral   Resp: 16  16   Height:      Weight:      SpO2:   96%     Neurologically intact Neurovascular intact Sensation intact distally Intact pulses distally Dorsiflexion/Plantar flexion intact Incision: dressing C/D/I and no drainage No cellulitis present Compartment soft   Lab Results  Component Value Date   WBC 14.0* 11/17/2013   HGB 15.6 11/17/2013   HCT 43.6 11/17/2013   MCV 90.8 11/17/2013   PLT 216 11/17/2013     Assessment/Plan: 1 Day Post-Op   Problem List Items Addressed This Visit     Musculoskeletal and Integument   *Tibial plateau fracture - Primary      Advance diet Up with therapy DVT ppx - SCDs, ambulation, asa Pin site care BID NWB RLE Pain control Plan for ORIF Friday with Dr. Alvester Morin, Etter Sjogren 11/18/2013, 10:29 AM [phone]

## 2013-11-18 NOTE — Progress Notes (Signed)
Order received for foot plate received.  Do not have these materials at [facility] long.  OT will obtain materials from [facility] and make this in the morning. [place], [place] [phone]

## 2013-11-18 NOTE — Progress Notes (Signed)
11/18/2013 1500 Provided pt with [facility] list and offered choice. Pt will look list over. NCM will continue to follow for final PT/OT recommendations for home for [facility] and DME. Will need HH orders at dc.  Isidoro Donning RN CCM Case Mgmt phone [phone]

## 2013-11-18 NOTE — Evaluation (Signed)
Physical Therapy Evaluation Patient Details Name: Roger Ayers MRN: [id number] DOB: 02-07-1966 Today's Date: 11/18/2013 Time: 4098-1191 PT Time Calculation (min): 25 min  PT Assessment / Plan / Recommendation History of Present Illness  R bicondylar tibial plateau fx 2* fall from 10' ladder, s/p external fixation  Clinical Impression  *Pt admitted with R tibial plateau fx 2* fall*. Pt currently with functional limitations due to the deficits listed below (see PT Problem List).  Pt will benefit from skilled PT to increase their independence and safety with mobility to allow discharge to the venue listed below.   **    PT Assessment  Patient needs continued PT services    Follow Up Recommendations  Home health PT    Does the patient have the potential to tolerate intense rehabilitation      Barriers to Discharge        Equipment Recommendations  Rolling walker with 5" wheels    Recommendations for Other Services     Frequency Min 6X/week    Precautions / Restrictions Precautions Precautions: Fall Restrictions Weight Bearing Restrictions: Yes RUE Weight Bearing: Non weight bearing   Pertinent Vitals/Pain *7/10 RLE premedicated**      Mobility  Bed Mobility Bed Mobility: Supine to Sit Supine to Sit: 3: Mod assist;HOB elevated Details for Bed Mobility Assistance: assist to support RLE; VCs for technique Transfers Transfers: Sit to Stand;Stand to Sit Sit to Stand: 1: +2 Total assist;From bed;With upper extremity assist Sit to Stand: Patient Percentage: 70% Stand to Sit: 1: +2 Total assist;To chair/3-in-1;With upper extremity assist;With armrests Stand to Sit: Patient Percentage: 90% Details for Transfer Assistance: +2 for safety 2* NWB status, VCs hand placement Ambulation/Gait Ambulation/Gait Assistance: 1: +2 Total assist Ambulation/Gait: Patient Percentage: 90% Ambulation Distance (Feet): 35 Feet Assistive device: Rolling walker Gait Pattern: Step-to  pattern Gait velocity: NWB RLE, +2 for safety due to NWB status, min A for balance, VCs for technique    Exercises General Exercises - Lower Extremity Ankle Circles/Pumps: AROM;Right;5 reps   PT Diagnosis: Difficulty walking;Acute pain  PT Problem List: Decreased activity tolerance;Decreased strength;Decreased mobility;Pain PT Treatment Interventions: DME instruction;Gait training;Stair training;Functional mobility training;Therapeutic exercise;Therapeutic activities     PT Goals(Current goals can be found in the care plan section) Acute Rehab PT Goals Patient Stated Goal: return to work PT Goal Formulation: With patient Time For Goal Achievement: 12/02/13  Visit Information  Last PT Received On: 11/18/13 Assistance Needed: +2 History of Present Illness: R bicondylar tibial plateau fx 2* fall from 10' ladder, s/p external fixation       Prior Functioning  Home Living Family/patient expects to be discharged to:: Private residence Living Arrangements: Spouse/significant other Available Help at Discharge: Family Type of Home: House Home Access: Stairs to enter Secretary/administrator of Steps: 3 Entrance Stairs-Rails: None Home Layout: One level Home Equipment: Crutches Prior Function Level of Independence: Independent Communication Communication: No difficulties    Cognition  Cognition Arousal/Alertness: Awake/alert Behavior During Therapy: WFL for tasks assessed/performed Overall Cognitive Status: Within Functional Limits for tasks assessed    Extremity/Trunk Assessment Upper Extremity Assessment Upper Extremity Assessment: Overall WFL for tasks assessed Lower Extremity Assessment Lower Extremity Assessment: RLE deficits/detail RLE Deficits / Details: able to actively PF/DF R ankle, knee NT 2* ext fixation, noted active recruitment of hip muscles with bed mobility RLE: Unable to fully assess due to pain Cervical / Trunk Assessment Cervical / Trunk Assessment: Normal    Balance Balance Balance Assessed: Yes Static Sitting Balance Static Sitting -  Balance Support: Feet supported;No upper extremity supported Static Sitting - Level of Assistance: 7: Independent Static Sitting - Comment/# of Minutes: 2  End of Session PT - End of Session Equipment Utilized During Treatment: Gait belt Activity Tolerance: Patient limited by fatigue;Patient limited by pain Patient left: in chair;with family/visitor present;with call bell/phone within reach Nurse Communication: Mobility status  GP     Ralene Bathe Kistler 11/18/2013, 11:48 AM  [phone]

## 2013-11-19 ENCOUNTER — Encounter (HOSPITAL_COMMUNITY): Payer: Self-pay | Admitting: Orthopaedic Surgery

## 2013-11-19 MED ORDER — ZOLPIDEM TARTRATE 10 MG PO TABS
10.0000 mg | ORAL_TABLET | Freq: Every evening | ORAL | Status: DC | PRN
  Administered 2013-11-20 – 2013-11-22 (×3): 10 mg via ORAL
  Filled 2013-11-19 (×3): qty 1

## 2013-11-19 NOTE — Progress Notes (Signed)
Clinical Social Work  CSW received inappropriate referral for SNF placement. Per chart review, PT recommending HH. CSW is signing off but available if further needs arise.  Unk Lightning, LCSW (Coverage for [employer])

## 2013-11-19 NOTE — Evaluation (Signed)
Occupational Therapy Evaluation Patient Details Name: Roger Ayers MRN: [id number] DOB: 08-08-1966 Today's Date: 11/19/2013 Time: 4098-1191 OT Time Calculation (min): 17 min  OT Assessment / Plan / Recommendation History of present illness R bicondylar tibial plateau fx 2* fall from 10' ladder, s/p external fixation   Clinical Impression   Pt presents to OT with decreased I with ADL activity due to problems listed below. Pt will benefit from OT to increase I with ADL activity in order to return to PLOF    OT Assessment  Patient needs continued OT Services    Follow Up Recommendations  Home health OT       Equipment Recommendations  3 in 1 bedside comode       Frequency  Min 2X/week    Precautions / Restrictions Precautions Precautions: Fall Restrictions Weight Bearing Restrictions: Yes RUE Weight Bearing: Non weight bearing (RLE)       ADL  Transfers/Ambulation Related to ADLs: OT fabricated a soft foot plate for pt to keep R foot at 90 degree dorsiflexion.  Pt educated on adjusting strap and where to tie strap on fixator to prevent foot drop ADL Comments: Pt will need TFI cut out bedside commode for home.  Pt reported BSC in his room is not comfortable as he did get on it earlier in the day.    OT Diagnosis: Generalized weakness;Acute pain  OT Problem List: Decreased strength;Decreased activity tolerance;Pain;Increased edema;Decreased knowledge of use of DME or AE OT Treatment Interventions: Self-care/ADL training;Patient/family education;DME and/or AE instruction;Splinting   OT Goals(Current goals can be found in the care plan section) Acute Rehab OT Goals Patient Stated Goal: return to work OT Goal Formulation: With patient Time For Goal Achievement: 11/26/13 Potential to Achieve Goals: Good ADL Goals Pt Will Perform Lower Body Dressing: with min assist;sit to/from stand Pt Will Transfer to Toilet: with min assist Pt Will Perform Toileting - Clothing  Manipulation and hygiene: with min assist;sit to/from stand  Visit Information  Assistance Needed: +2 History of Present Illness: R bicondylar tibial plateau fx 2* fall from 10' ladder, s/p external fixation       Prior Functioning     Home Living Family/patient expects to be discharged to:: Private residence Living Arrangements: Spouse/significant other Available Help at Discharge: Family Type of Home: House Home Access: Stairs to enter Secretary/administrator of Steps: 3 Entrance Stairs-Rails: None Home Layout: One level Home Equipment: Crutches Prior Function Level of Independence: Independent Communication Communication: No difficulties         Vision/Perception Vision - History Patient Visual Report: No change from baseline   Cognition  Cognition Arousal/Alertness: Awake/alert Behavior During Therapy: WFL for tasks assessed/performed Overall Cognitive Status: Within Functional Limits for tasks assessed    Extremity/Trunk Assessment Upper Extremity Assessment Upper Extremity Assessment: Overall WFL for tasks assessed Lower Extremity Assessment RLE Deficits / Details: able to actively PF/DF R ankle, knee NT 2* ext fixation, noted active recruitment of hip muscles with bed mobility RLE: Unable to fully assess due to pain Cervical / Trunk Assessment Cervical / Trunk Assessment: Normal     Mobility Bed Mobility Bed Mobility: Supine to Sit Supine to Sit: HOB elevated;4: Min assist Details for Bed Mobility Assistance: assist to support RLE; VCs for technique Transfers Sit to Stand: 1: +2 Total assist;From bed;With upper extremity assist Sit to Stand: Patient Percentage: 80% Stand to Sit: 1: +2 Total assist;To chair/3-in-1;With upper extremity assist;With armrests Stand to Sit: Patient Percentage: 90% Details for Transfer Assistance: +2 for safety  2* NWB status, VCs hand placement           End of Session OT - End of Session Activity Tolerance: Patient  limited by pain Patient left: in chair;with call bell/phone within reach  GO     Roger Ayers, [place] Kung 11/19/2013, 1:24 PM

## 2013-11-19 NOTE — Progress Notes (Signed)
Patient ID: Roger Ayers, male   DOB: 1966-04-07, 47 y.o.   MRN: [id number] I spoke with [name] in length about his situation.  Will plan on surgery this coming Friday.  May let him go home tomorrow on Lovenox.

## 2013-11-19 NOTE — Progress Notes (Signed)
Pt. States he is ok with his home cpap. RT informed pt. To notify if he needs any assistance.

## 2013-11-19 NOTE — Progress Notes (Signed)
Physical Therapy Treatment Patient Details Name: Roger Ayers MRN: [id number] DOB: 08/07/1966 Today's Date: 11/19/2013 Time: 4098-1191 PT Time Calculation (min): 24 min  PT Assessment / Plan / Recommendation  History of Present Illness R bicondylar tibial plateau fx 2* fall from 10' ladder, s/p external fixation   PT Comments   *Progressing well with mobility. Pain limites gait distance and tolerance to RLE exercises. **  Follow Up Recommendations  Home health PT     Does the patient have the potential to tolerate intense rehabilitation     Barriers to Discharge        Equipment Recommendations  Rolling walker with 5" wheels    Recommendations for Other Services    Frequency Min 6X/week   Progress towards PT Goals Progress towards PT goals: Progressing toward goals  Plan Current plan remains appropriate    Precautions / Restrictions Precautions Precautions: Fall Restrictions Weight Bearing Restrictions: Yes RUE Weight Bearing: Non weight bearing (RLE)   Pertinent Vitals/Pain *6/10 RLE Premedicated, ice applied**    Mobility  Bed Mobility Bed Mobility: Supine to Sit Supine to Sit: HOB elevated;4: Min assist Details for Bed Mobility Assistance: assist to support RLE; VCs for technique Transfers Transfers: Sit to Stand;Stand to Sit Sit to Stand: 1: +2 Total assist;From bed;With upper extremity assist Sit to Stand: Patient Percentage: 80% Stand to Sit: 1: +2 Total assist;To chair/3-in-1;With upper extremity assist;With armrests Stand to Sit: Patient Percentage: 90% Details for Transfer Assistance: +2 for safety 2* NWB status, VCs hand placement Ambulation/Gait Ambulation/Gait Assistance: 1: +2 Total assist Ambulation/Gait: Patient Percentage: 90% Ambulation Distance (Feet): 45 Feet Assistive device: Rolling walker Gait Pattern: Step-to pattern Gait velocity: NWB RLE, +2 for safety due to NWB status, min guard A for balance, VCs for technique    Exercises  General Exercises - Lower Extremity Ankle Circles/Pumps: AROM;Right;5 reps Quad Sets:  (Attempted Quad Set RLE but too painful) Hip ABduction/ADduction: AAROM;Right;10 reps   PT Diagnosis:    PT Problem List:   PT Treatment Interventions:     PT Goals (current goals can now be found in the care plan section) Acute Rehab PT Goals Patient Stated Goal: return to work PT Goal Formulation: With patient Time For Goal Achievement: 12/02/13  Visit Information  Last PT Received On: 11/19/13 Assistance Needed: +2 History of Present Illness: R bicondylar tibial plateau fx 2* fall from 10' ladder, s/p external fixation    Subjective Data  Patient Stated Goal: return to work   Cognition  Cognition Arousal/Alertness: Awake/alert Behavior During Therapy: WFL for tasks assessed/performed Overall Cognitive Status: Within Functional Limits for tasks assessed    Balance     End of Session PT - End of Session Equipment Utilized During Treatment: Gait belt Activity Tolerance: Patient limited by fatigue;Patient limited by pain Patient left: in chair;with call bell/phone within reach Nurse Communication: Mobility status   GP     Ralene Bathe Kistler 11/19/2013, 11:20 AM [phone]

## 2013-11-20 MED ORDER — ENOXAPARIN SODIUM 30 MG/0.3ML ~~LOC~~ SOLN
30.0000 mg | Freq: Two times a day (BID) | SUBCUTANEOUS | Status: DC
  Administered 2013-11-20 – 2013-11-22 (×5): 30 mg via SUBCUTANEOUS
  Filled 2013-11-20 (×7): qty 0.3

## 2013-11-20 NOTE — Progress Notes (Signed)
Subjective: 3 Days Post-Op Procedure(s) (LRB): EXTERNAL FIXATION LEG (Right) Patient reports pain as moderate.  Very diffiicult mobility with ex-fix.  Objective: Vital signs in last 24 hours: Temp:  [98.2 F (36.8 C)-99.2 F (37.3 C)] 98.9 F (37.2 C) (11/25 0400) Pulse Rate:  [80-87] 82 (11/25 0400) Resp:  [16] 16 (11/25 0400) BP: (165-180)/(88-107) 165/88 mmHg (11/25 0400) SpO2:  [97 %-98 %] 97 % (11/25 0400)  Intake/Output from previous day: 11/24 0701 - 11/25 0700 In: 1180 [P.O.:720; I.V.:460] Out: 550 [Urine:550] Intake/Output this shift:     Recent Labs  11/17/13 1555  HGB 15.6    Recent Labs  11/17/13 1555  WBC 14.0*  RBC 4.80  HCT 43.6  PLT 216    Recent Labs  11/17/13 1555 11/18/13 0437  NA 135 136  K 3.9 3.8  CL 104 103  CO2 22 25  BUN 13 10  CREATININE 1.10 1.05  GLUCOSE 97 156*  CALCIUM 8.7 8.5   No results found for this basename: LABPT, INR,  in the last 72 hours  Sensation intact distally Intact pulses distally Dorsiflexion/Plantar flexion intact Incision: moderate drainage No cellulitis present Compartment soft  Assessment/Plan: 3 Days Post-Op Procedure(s) (LRB): EXTERNAL FIXATION LEG (Right) Up with therapy Will need to keep here until surgery this Friday Start Lovenox  Kathryne Hitch 11/20/2013, 7:17 AM

## 2013-11-20 NOTE — ED Provider Notes (Signed)
Medical screening examination/treatment/procedure(s) were performed by non-physician practitioner and as supervising physician I was immediately available for consultation/collaboration.  Gilda Crease, MD 11/20/13 [phone]

## 2013-11-20 NOTE — Progress Notes (Signed)
Occupational Therapy Treatment Patient Details Name: Roger Ayers MRN: [id number] DOB: 08-01-1966 Today's Date: 11/20/2013 Time: 1015-1040 OT Time Calculation (min): 25 min  OT Assessment / Plan / Recommendation  History of present illness R bicondylar tibial plateau fx 2* fall from 10' ladder, s/p external fixation   OT comments  Began education on AE options for LB self care. Reviewed how to don/doff foot plate and practiced this with pt. Reviewed wear schedule with pt also.   Follow Up Recommendations  Home health OT;Supervision/Assistance - 24 hour    Barriers to Discharge       Equipment Recommendations  3 in 1 bedside comode    Recommendations for Other Services    Frequency Min 2X/week   Progress towards OT Goals Progress towards OT goals: Progressing toward goals  Plan Discharge plan remains appropriate    Precautions / Restrictions Precautions Precautions: Fall Restrictions Weight Bearing Restrictions: Yes RLE Weight Bearing: Non weight bearing   Pertinent Vitals/Pain 6/10 R Leg. Informed nursing, reposition.    ADL  Equipment Used: Long-handled shoe horn;Long-handled sponge;Reacher;Rolling walker ADL Comments: Introduced all AE and demonstrated for pt. Explained coverage and benefits of AE for LB self care. Helped doff footplate and encouraged pt to allow for a few rest breaks with foot plate off during the day for 20-30 minutes at a time. Pt states R foot felt stiff and explained having footplate off with tension released on straps may help. Showed pt how to reposition straps for proper tension/placement and he verbalizes understanding and to call for assist as needed. Pt verbalizes understanding of foot plate wear schedule also. Pt states he got up to 3in1 with nursing earlier and declined need to practice right now. Returned after nursing changed dressing and helped pt place footplate back on.     OT Diagnosis:    OT Problem List:   OT Treatment  Interventions:     OT Goals(current goals can now be found in the care plan section)    Visit Information  Last OT Received On: 11/20/13 History of Present Illness: R bicondylar tibial plateau fx 2* fall from 10' ladder, s/p external fixation    Subjective Data      Prior Functioning       Cognition  Cognition Arousal/Alertness: Awake/alert Behavior During Therapy: WFL for tasks assessed/performed Overall Cognitive Status: Within Functional Limits for tasks assessed    Mobility       Exercises      Balance     End of Session OT - End of Session Equipment Utilized During Treatment: Other (comment) (AE) Patient left: in chair;with call bell/phone within reach  GO     Lennox Laity [phone] 11/20/2013, 11:55 AM

## 2013-11-21 MED ORDER — BISACODYL 10 MG RE SUPP
10.0000 mg | Freq: Every day | RECTAL | Status: DC | PRN
  Administered 2013-11-21: 09:00:00 10 mg via RECTAL
  Filled 2013-11-21: qty 1

## 2013-11-21 NOTE — Progress Notes (Signed)
Pt. States he is ok with his cpap. RT informed pt. To notify if he needs any assistance.

## 2013-11-21 NOTE — Progress Notes (Signed)
Patient ID: Roger Ayers, male   DOB: 22-Jan-1966, 47 y.o.   MRN: [id number] No acute changes.  I showed him his CT scan of the right knee.  He understands that definitive surgery will be this coming Friday.

## 2013-11-21 NOTE — Progress Notes (Signed)
Physical Therapy Treatment Patient Details Name: Roger Ayers MRN: [id number] DOB: May 30, 1966 Today's Date: 11/21/2013 Time: 7829-5621 PT Time Calculation (min): 26 min  PT Assessment / Plan / Recommendation  History of Present Illness R bicondylar tibial plateau fx 2* fall from 10' ladder, s/p external fixation   PT Comments   Assisted pt OOB to amb limited distance in hallway.  Positioned in recliner with pillows and ICE packs.   Follow Up Recommendations  Home health PT     Does the patient have the potential to tolerate intense rehabilitation     Barriers to Discharge        Equipment Recommendations  Rolling walker with 5" wheels    Recommendations for Other Services    Frequency Min 6X/week   Progress towards PT Goals Progress towards PT goals: Progressing toward goals  Plan      Precautions / Restrictions Precautions Precautions: Fall Restrictions Weight Bearing Restrictions: Yes RLE Weight Bearing: Non weight bearing    Pertinent Vitals/Pain C/o 8/10 after amb Pre med ICE applied    Mobility  Bed Mobility Bed Mobility: Supine to Sit Supine to Sit: 5: Supervision Details for Bed Mobility Assistance: Pt able to support own R LE off bed Transfers Transfers: Sit to Stand;Stand to Sit Sit to Stand: 5: Supervision;4: Min guard;From bed Stand to Sit: 5: Supervision;4: Min guard;To chair/3-in-1 Details for Transfer Assistance: increased time Ambulation/Gait Ambulation/Gait Assistance: 5: Supervision;4: Min guard Ambulation Distance (Feet): 50 Feet Assistive device: Rolling walker Ambulation/Gait Assistance Details: <25% VC's on safety with turns and increased time Gait Pattern: Step-to pattern Gait velocity: decreased     PT Goals (current goals can now be found in the care plan section)    Visit Information  Last PT Received On: 11/21/13 History of Present Illness: R bicondylar tibial plateau fx 2* fall from 10' ladder, s/p external fixation     Subjective Data      Cognition       Balance     End of Session PT - End of Session Equipment Utilized During Treatment: Gait belt Activity Tolerance: Patient limited by fatigue;Patient limited by pain Patient left: in chair;with call bell/phone within reach   Felecia Shelling  PTA [facility]  Acute  Rehab Pager      [phone]

## 2013-11-21 NOTE — Care Management Note (Signed)
  Page 1 of 1   11/21/2013     4:59:13 PM   CARE MANAGEMENT NOTE 11/21/2013  Patient:  Roger Ayers, Roger Ayers   Account Number:  [id number]  Date Initiated:  11/18/2013  Documentation initiated by:  [facility]  Subjective/Objective Assessment:   External fixation right tibial plateau fracture multiplane;     Action/Plan:   HH waiting PT/OT final recommendations   Anticipated DC Date:     Anticipated DC Plan:  HOME W HOME HEALTH SERVICES      DC Planning Services  CM consult      Choice offered to / List presented to:             Status of service:  In process, will continue to follow Medicare Important Message given?   (If response is "NO", the following Medicare IM given date fields will be blank) Date Medicare IM given:   Date Additional Medicare IM given:    Discharge Disposition:    Per UR Regulation:    If discussed at Long Length of Stay Meetings, dates discussed:    Comments:  11/26//2014 Colleen Can BSN RN CCM [phone] CM spoke with patient. Current plans are for return to his home where spouse and mother will be caregivers. External fixatoe in place. [name] will need rw and 3n1. Will follow for other orders.  11/18/2013 1500 Provided pt with [facility] list and offered choice. Pt will look list over. NCM will continue to follow for final PT/OT recommendations for home for [facility] and DME. Will need HH orders at dc.  Isidoro Donning RN CCM Case Mgmt phone [phone].

## 2013-11-22 NOTE — Progress Notes (Signed)
Patient ID: Roger Ayers, male   DOB: 1966-04-26, 47 y.o.   MRN: [id number] Doing well.  Stable vitals.  Right leg spanning ex-fix intact.  Foot well perfused.  Swelling down and compartments soft.  He understands fully that we will proceed to the OR tomorrow am for removal of his ex-fix and ORIF of his right tibial plateau fracture.

## 2013-11-22 NOTE — Progress Notes (Signed)
Spoke with patient regarding cpap.  Pt states he doesn't need help with his home cpap machine.  Pt was advised that RT is available all night should he need further assistance.  RN aware.

## 2013-11-23 ENCOUNTER — Encounter (HOSPITAL_COMMUNITY): Admission: EM | Disposition: A | Discharge status: Home Health | Payer: Self-pay | Source: Home / Self Care

## 2013-11-23 ENCOUNTER — Encounter (HOSPITAL_COMMUNITY): Payer: Self-pay

## 2013-11-23 ENCOUNTER — Inpatient Hospital Stay (HOSPITAL_COMMUNITY): Payer: BC Managed Care – PPO

## 2013-11-23 ENCOUNTER — Encounter (HOSPITAL_COMMUNITY): Payer: BC Managed Care – PPO | Admitting: Anesthesiology

## 2013-11-23 ENCOUNTER — Inpatient Hospital Stay (HOSPITAL_COMMUNITY): Payer: BC Managed Care – PPO | Admitting: Anesthesiology

## 2013-11-23 HISTORY — PX: ORIF TIBIA PLATEAU: SHX2132

## 2013-11-23 HISTORY — PX: EXTERNAL FIXATION REMOVAL: SHX5040

## 2013-11-23 SURGERY — OPEN REDUCTION INTERNAL FIXATION (ORIF) TIBIAL PLATEAU
Anesthesia: General | Site: Leg Lower | Laterality: Right | Wound class: Clean

## 2013-11-23 MED ORDER — DIPHENHYDRAMINE HCL 12.5 MG/5ML PO ELIX: 12.5000 mg | ORAL_SOLUTION | ORAL | Status: DC | PRN

## 2013-11-23 MED ORDER — PROPOFOL 10 MG/ML IV BOLUS
INTRAVENOUS | Status: DC | PRN
  Administered 2013-11-23: 200 mg via INTRAVENOUS

## 2013-11-23 MED ORDER — ONDANSETRON HCL 4 MG PO TABS: 4.0000 mg | ORAL_TABLET | Freq: Four times a day (QID) | ORAL | Status: DC | PRN

## 2013-11-23 MED ORDER — BISACODYL 10 MG RE SUPP: 10.0000 mg | Freq: Every day | RECTAL | Status: DC | PRN

## 2013-11-23 MED ORDER — KETAMINE HCL 10 MG/ML IJ SOLN
INTRAMUSCULAR | Status: DC | PRN
  Administered 2013-11-23: 50 mg via INTRAVENOUS
  Administered 2013-11-23 (×2): 25 mg via INTRAVENOUS

## 2013-11-23 MED ORDER — 0.9 % SODIUM CHLORIDE (POUR BTL) OPTIME
TOPICAL | Status: DC | PRN
  Administered 2013-11-23: 500 mL

## 2013-11-23 MED ORDER — MUPIROCIN 2 % EX OINT
1.0000 "application " | TOPICAL_OINTMENT | Freq: Two times a day (BID) | CUTANEOUS | Status: DC
  Administered 2013-11-23 – 2013-11-25 (×5): 1 via NASAL
  Filled 2013-11-23 (×2): qty 22

## 2013-11-23 MED ORDER — MORPHINE SULFATE 2 MG/ML IJ SOLN
1.0000 mg | INTRAMUSCULAR | Status: DC | PRN
  Administered 2013-11-23 – 2013-11-24 (×4): 1 mg via INTRAVENOUS
  Filled 2013-11-23 (×4): qty 1

## 2013-11-23 MED ORDER — DOCUSATE SODIUM 100 MG PO CAPS
100.0000 mg | ORAL_CAPSULE | Freq: Two times a day (BID) | ORAL | Status: DC
  Administered 2013-11-23 – 2013-11-25 (×4): 100 mg via ORAL
  Filled 2013-11-23 (×6): qty 1

## 2013-11-23 MED ORDER — ONDANSETRON HCL 4 MG/2ML IJ SOLN: 4.0000 mg | Freq: Four times a day (QID) | INTRAMUSCULAR | Status: DC | PRN

## 2013-11-23 MED ORDER — NEOSTIGMINE METHYLSULFATE 1 MG/ML IJ SOLN
INTRAMUSCULAR | Status: DC | PRN
  Administered 2013-11-23: 3 mg via INTRAVENOUS

## 2013-11-23 MED ORDER — SODIUM CHLORIDE 0.9 % IV SOLN
INTRAVENOUS | Status: DC
  Administered 2013-11-23 – 2013-11-24 (×2): via INTRAVENOUS

## 2013-11-23 MED ORDER — HYDROCODONE-ACETAMINOPHEN 5-325 MG PO TABS: 1.0000 | ORAL_TABLET | ORAL | Status: DC | PRN

## 2013-11-23 MED ORDER — METHOCARBAMOL 500 MG PO TABS
500.0000 mg | ORAL_TABLET | Freq: Four times a day (QID) | ORAL | Status: DC | PRN
  Administered 2013-11-24 – 2013-11-25 (×4): 500 mg via ORAL
  Filled 2013-11-23 (×5): qty 1

## 2013-11-23 MED ORDER — ASPIRIN EC 325 MG PO TBEC
325.0000 mg | DELAYED_RELEASE_TABLET | Freq: Two times a day (BID) | ORAL | Status: DC
  Administered 2013-11-23 – 2013-11-25 (×4): 325 mg via ORAL
  Filled 2013-11-23 (×6): qty 1

## 2013-11-23 MED ORDER — FENTANYL CITRATE 0.05 MG/ML IJ SOLN
INTRAMUSCULAR | Status: DC | PRN
  Administered 2013-11-23: 75 ug via INTRAVENOUS
  Administered 2013-11-23: 50 ug via INTRAVENOUS
  Administered 2013-11-23: 25 ug via INTRAVENOUS
  Administered 2013-11-23 (×4): 50 ug via INTRAVENOUS

## 2013-11-23 MED ORDER — GLYCOPYRROLATE 0.2 MG/ML IJ SOLN
INTRAMUSCULAR | Status: DC | PRN
  Administered 2013-11-23: 0.2 mg via INTRAVENOUS

## 2013-11-23 MED ORDER — POLYETHYLENE GLYCOL 3350 17 G PO PACK
17.0000 g | PACK | Freq: Every day | ORAL | Status: DC | PRN
  Filled 2013-11-23: qty 1

## 2013-11-23 MED ORDER — METHOCARBAMOL 100 MG/ML IJ SOLN
500.0000 mg | Freq: Four times a day (QID) | INTRAVENOUS | Status: DC | PRN
  Administered 2013-11-23 (×2): 500 mg via INTRAVENOUS
  Filled 2013-11-23 (×2): qty 5

## 2013-11-23 MED ORDER — ACETAMINOPHEN 10 MG/ML IV SOLN
1000.0000 mg | Freq: Once | INTRAVENOUS | Status: AC
  Administered 2013-11-23: 12:00:00 1000 mg via INTRAVENOUS
  Filled 2013-11-23: qty 100

## 2013-11-23 MED ORDER — GLYCOPYRROLATE 0.2 MG/ML IJ SOLN
INTRAMUSCULAR | Status: AC
  Filled 2013-11-23: qty 3

## 2013-11-23 MED ORDER — CHLORHEXIDINE GLUCONATE CLOTH 2 % EX PADS
6.0000 | MEDICATED_PAD | Freq: Every day | CUTANEOUS | Status: DC
  Administered 2013-11-23 – 2013-11-25 (×3): 6 via TOPICAL

## 2013-11-23 MED ORDER — MIDAZOLAM HCL 2 MG/2ML IJ SOLN
INTRAMUSCULAR | Status: AC
  Filled 2013-11-23: qty 2

## 2013-11-23 MED ORDER — ONDANSETRON HCL 4 MG/2ML IJ SOLN
INTRAMUSCULAR | Status: DC | PRN
  Administered 2013-11-23: 4 mg via INTRAVENOUS

## 2013-11-23 MED ORDER — ONDANSETRON HCL 4 MG/2ML IJ SOLN
INTRAMUSCULAR | Status: AC
  Filled 2013-11-23: qty 2

## 2013-11-23 MED ORDER — MIDAZOLAM HCL 5 MG/5ML IJ SOLN
INTRAMUSCULAR | Status: DC | PRN
  Administered 2013-11-23: 2 mg via INTRAVENOUS

## 2013-11-23 MED ORDER — PROMETHAZINE HCL 25 MG/ML IJ SOLN: 6.2500 mg | INTRAMUSCULAR | Status: DC | PRN

## 2013-11-23 MED ORDER — HYDROMORPHONE HCL PF 1 MG/ML IJ SOLN
INTRAMUSCULAR | Status: AC
  Filled 2013-11-23: qty 1

## 2013-11-23 MED ORDER — OXYCODONE HCL 5 MG PO TABS
5.0000 mg | ORAL_TABLET | ORAL | Status: DC | PRN
  Administered 2013-11-24 – 2013-11-25 (×9): 10 mg via ORAL
  Filled 2013-11-23 (×9): qty 2

## 2013-11-23 MED ORDER — ACETAMINOPHEN NICU IV SYRINGE 10 MG/ML: 1000.0000 mg | Freq: Once | INTRAVENOUS | Status: DC

## 2013-11-23 MED ORDER — HYDROGEN PEROXIDE 3 % EX SOLN
CUTANEOUS | Status: AC
  Filled 2013-11-23: qty 473

## 2013-11-23 MED ORDER — KETOROLAC TROMETHAMINE 30 MG/ML IJ SOLN
INTRAMUSCULAR | Status: AC
  Filled 2013-11-23: qty 1

## 2013-11-23 MED ORDER — ROCURONIUM BROMIDE 100 MG/10ML IV SOLN
INTRAVENOUS | Status: AC
  Filled 2013-11-23: qty 1

## 2013-11-23 MED ORDER — LIDOCAINE HCL (CARDIAC) 20 MG/ML IV SOLN
INTRAVENOUS | Status: AC
  Filled 2013-11-23: qty 5

## 2013-11-23 MED ORDER — FENTANYL CITRATE 0.05 MG/ML IJ SOLN
INTRAMUSCULAR | Status: AC
  Filled 2013-11-23: qty 2

## 2013-11-23 MED ORDER — LABETALOL HCL 5 MG/ML IV SOLN
INTRAVENOUS | Status: DC | PRN
  Administered 2013-11-23: 5 mg via INTRAVENOUS

## 2013-11-23 MED ORDER — LIDOCAINE HCL (CARDIAC) 20 MG/ML IV SOLN
INTRAVENOUS | Status: DC | PRN
  Administered 2013-11-23: 100 mg via INTRAVENOUS

## 2013-11-23 MED ORDER — HYDROMORPHONE HCL PF 1 MG/ML IJ SOLN: 1.0000 mg | INTRAMUSCULAR | Status: DC | PRN

## 2013-11-23 MED ORDER — FENTANYL CITRATE 0.05 MG/ML IJ SOLN
25.0000 ug | INTRAMUSCULAR | Status: DC | PRN
  Administered 2013-11-23 (×2): 25 ug via INTRAVENOUS

## 2013-11-23 MED ORDER — DEXAMETHASONE SODIUM PHOSPHATE 10 MG/ML IJ SOLN
INTRAMUSCULAR | Status: DC | PRN
  Administered 2013-11-23: 10 mg via INTRAVENOUS

## 2013-11-23 MED ORDER — LABETALOL HCL 5 MG/ML IV SOLN
INTRAVENOUS | Status: AC
  Filled 2013-11-23: qty 4

## 2013-11-23 MED ORDER — CEFAZOLIN SODIUM-DEXTROSE 2-3 GM-% IV SOLR
INTRAVENOUS | Status: AC
  Filled 2013-11-23: qty 50

## 2013-11-23 MED ORDER — METOCLOPRAMIDE HCL 5 MG/ML IJ SOLN
5.0000 mg | Freq: Three times a day (TID) | INTRAMUSCULAR | Status: DC | PRN
  Administered 2013-11-23: 10 mg via INTRAVENOUS
  Filled 2013-11-23: qty 2

## 2013-11-23 MED ORDER — METOPROLOL TARTRATE 1 MG/ML IV SOLN
INTRAVENOUS | Status: AC
  Filled 2013-11-23: qty 5

## 2013-11-23 MED ORDER — ROCURONIUM BROMIDE 100 MG/10ML IV SOLN
INTRAVENOUS | Status: DC | PRN
  Administered 2013-11-23: 30 mg via INTRAVENOUS

## 2013-11-23 MED ORDER — METOCLOPRAMIDE HCL 10 MG PO TABS: 5.0000 mg | ORAL_TABLET | Freq: Three times a day (TID) | ORAL | Status: DC | PRN

## 2013-11-23 MED ORDER — KETAMINE HCL 50 MG/ML IJ SOLN
INTRAMUSCULAR | Status: AC
  Filled 2013-11-23: qty 10

## 2013-11-23 MED ORDER — ZOLPIDEM TARTRATE 5 MG PO TABS
5.0000 mg | ORAL_TABLET | Freq: Every evening | ORAL | Status: DC | PRN
  Administered 2013-11-24 (×2): 5 mg via ORAL
  Filled 2013-11-23 (×2): qty 1

## 2013-11-23 MED ORDER — HYDROMORPHONE HCL PF 1 MG/ML IJ SOLN
0.2500 mg | INTRAMUSCULAR | Status: DC | PRN
  Administered 2013-11-23 (×4): 0.5 mg via INTRAVENOUS

## 2013-11-23 MED ORDER — NEOSTIGMINE METHYLSULFATE 1 MG/ML IJ SOLN
INTRAMUSCULAR | Status: AC
  Filled 2013-11-23: qty 10

## 2013-11-23 MED ORDER — LACTATED RINGERS IV SOLN
INTRAVENOUS | Status: DC
  Administered 2013-11-23: 13:00:00 via INTRAVENOUS
  Administered 2013-11-23: 1000 mL via INTRAVENOUS
  Administered 2013-11-23: 11:00:00 via INTRAVENOUS

## 2013-11-23 MED ORDER — CEFAZOLIN SODIUM 1-5 GM-% IV SOLN
1.0000 g | Freq: Four times a day (QID) | INTRAVENOUS | Status: AC
  Administered 2013-11-23 – 2013-11-24 (×3): 1 g via INTRAVENOUS
  Filled 2013-11-23 (×3): qty 50

## 2013-11-23 MED ORDER — SUCCINYLCHOLINE CHLORIDE 20 MG/ML IJ SOLN
INTRAMUSCULAR | Status: DC | PRN
  Administered 2013-11-23: 100 mg via INTRAVENOUS

## 2013-11-23 MED ORDER — PROPOFOL 10 MG/ML IV BOLUS
INTRAVENOUS | Status: AC
  Filled 2013-11-23: qty 20

## 2013-11-23 MED ORDER — CEFAZOLIN SODIUM-DEXTROSE 2-3 GM-% IV SOLR
2.0000 g | Freq: Once | INTRAVENOUS | Status: AC
  Administered 2013-11-23: 10:00:00 2 g via INTRAVENOUS

## 2013-11-23 MED ORDER — SUCCINYLCHOLINE CHLORIDE 20 MG/ML IJ SOLN
INTRAMUSCULAR | Status: AC
  Filled 2013-11-23: qty 1

## 2013-11-23 MED ORDER — FENTANYL CITRATE 0.05 MG/ML IJ SOLN
INTRAMUSCULAR | Status: AC
  Filled 2013-11-23: qty 5

## 2013-11-23 MED ORDER — KETOROLAC TROMETHAMINE 30 MG/ML IJ SOLN
INTRAMUSCULAR | Status: DC | PRN
  Administered 2013-11-23: 30 mg via INTRAVENOUS

## 2013-11-23 SURGICAL SUPPLY — 85 items
BAG ZIPLOCK 12X15 (MISCELLANEOUS) ×2 IMPLANT
BANDAGE ELASTIC 4 VELCRO ST LF (GAUZE/BANDAGES/DRESSINGS) IMPLANT
BANDAGE ELASTIC 6 VELCRO ST LF (GAUZE/BANDAGES/DRESSINGS) IMPLANT
BANDAGE ESMARK 6X9 LF (GAUZE/BANDAGES/DRESSINGS) ×1 IMPLANT
BANDAGE GAUZE ELAST BULKY 4 IN (GAUZE/BANDAGES/DRESSINGS) ×4 IMPLANT
BIT DRILL 100X2.5XANTM LCK (BIT) ×1 IMPLANT
BIT DRILL CAL (BIT) ×1 IMPLANT
BIT DRILL SLEEVE MEASURING (BIT) ×1 IMPLANT
BIT DRL 100X2.5XANTM LCK (BIT) ×1
BNDG COHESIVE 4X5 TAN STRL (GAUZE/BANDAGES/DRESSINGS) IMPLANT
BNDG COHESIVE 6X5 TAN STRL LF (GAUZE/BANDAGES/DRESSINGS) IMPLANT
BNDG ELASTIC 6X15 VLCR STRL LF (GAUZE/BANDAGES/DRESSINGS) ×2 IMPLANT
BNDG ESMARK 6X9 LF (GAUZE/BANDAGES/DRESSINGS) ×2
BONE CHIP PRESERV 30CC PCAN30 (Bone Implant) ×2 IMPLANT
CATH ROBINSON RED A/P 16FR (CATHETERS) ×2 IMPLANT
CHIPS CORTICOCANC 30CC CC30 (Bone Implant) ×2 IMPLANT
CUFF TOURN SGL QUICK 34 (TOURNIQUET CUFF) ×1
CUFF TRNQT CYL 34X4X40X1 (TOURNIQUET CUFF) ×1 IMPLANT
DECANTER SPIKE VIAL GLASS SM (MISCELLANEOUS) IMPLANT
DRAPE C-ARM 42X120 X-RAY (DRAPES) ×2 IMPLANT
DRAPE ORTHO SPLIT 77X108 STRL (DRAPES) ×2
DRAPE SURG ORHT 6 SPLT 77X108 (DRAPES) ×2 IMPLANT
DRAPE U-SHAPE 47X51 STRL (DRAPES) ×2 IMPLANT
DRILL BIT 2.5MM (BIT) ×1
DRILL BIT CAL (BIT) ×2
DRILL SLEEVE MEASURING (BIT) ×2
DRSG EMULSION OIL 3X16 NADH (GAUZE/BANDAGES/DRESSINGS) IMPLANT
DRSG PAD ABDOMINAL 8X10 ST (GAUZE/BANDAGES/DRESSINGS) ×4 IMPLANT
DURAPREP 26ML APPLICATOR (WOUND CARE) ×2 IMPLANT
ELECT REM PT RETURN 9FT ADLT (ELECTROSURGICAL) ×2
ELECTRODE REM PT RTRN 9FT ADLT (ELECTROSURGICAL) ×1 IMPLANT
EVACUATOR 1/8 PVC DRAIN (DRAIN) IMPLANT
GAUZE XEROFORM 5X9 LF (GAUZE/BANDAGES/DRESSINGS) ×2 IMPLANT
GLOVE BIO SURGEON STRL SZ7.5 (GLOVE) ×2 IMPLANT
GLOVE BIOGEL PI IND STRL 8 (GLOVE) ×2 IMPLANT
GLOVE BIOGEL PI INDICATOR 8 (GLOVE) ×2
GLOVE ECLIPSE 8.0 STRL XLNG CF (GLOVE) ×2 IMPLANT
GLOVE ORTHO TXT STRL SZ7.5 (GLOVE) ×2 IMPLANT
GOWN STRL REIN XL XLG (GOWN DISPOSABLE) ×2 IMPLANT
IMMOBILIZER KNEE 16 UNIV (MISCELLANEOUS) IMPLANT
IMMOBILIZER KNEE 20 (SOFTGOODS)
IMMOBILIZER KNEE 20 THIGH 36 (SOFTGOODS) IMPLANT
K-WIRE ACE 1.6X6 (WIRE) ×6
KIT BASIN OR (CUSTOM PROCEDURE TRAY) ×2 IMPLANT
KWIRE ACE 1.6X6 (WIRE) ×3 IMPLANT
MANIFOLD NEPTUNE II (INSTRUMENTS) ×2 IMPLANT
NEEDLE ANCHOR KEITH 2 7/8 STR (NEEDLE) ×2 IMPLANT
NS IRRIG 1000ML POUR BTL (IV SOLUTION) ×2 IMPLANT
PACK LOWER EXTREMITY WL (CUSTOM PROCEDURE TRAY) ×2 IMPLANT
PAD CAST 4YDX4 CTTN HI CHSV (CAST SUPPLIES) IMPLANT
PADDING CAST ABS 4INX4YD NS (CAST SUPPLIES) ×1
PADDING CAST ABS COTTON 4X4 ST (CAST SUPPLIES) ×1 IMPLANT
PADDING CAST COTTON 4X4 STRL (CAST SUPPLIES)
PADDING CAST COTTON 6X4 STRL (CAST SUPPLIES) IMPLANT
PASSER SUT SWANSON 36MM LOOP (INSTRUMENTS) ×2 IMPLANT
PLATE LOCK 9H STD RT PROX TIB (Plate) ×2 IMPLANT
POSITIONER SURGICAL ARM (MISCELLANEOUS) IMPLANT
SCREW CORT FT 32X3.5XNONLOCK (Screw) ×1 IMPLANT
SCREW CORTICAL 3.5MM  32MM (Screw) ×1 IMPLANT
SCREW CORTICAL 3.5MM  34MM (Screw) ×2 IMPLANT
SCREW CORTICAL 3.5MM 34MM (Screw) ×2 IMPLANT
SCREW LOCK CORT STAR 3.5X30 (Screw) ×2 IMPLANT
SCREW LOCK CORT STAR 3.5X65 (Screw) ×2 IMPLANT
SCREW LOCK CORT STAR 3.5X70 (Screw) ×2 IMPLANT
SCREW LOCK CORT STAR 3.5X75 (Screw) ×2 IMPLANT
SCREW LOCK CORT STAR 3.5X80 (Screw) ×4 IMPLANT
SCREW LP 3.5X85MM (Screw) ×2 IMPLANT
SET PAD KNEE POSITIONER (MISCELLANEOUS) IMPLANT
SPONGE GAUZE 4X4 12PLY (GAUZE/BANDAGES/DRESSINGS) ×2 IMPLANT
SPONGE LAP 18X18 X RAY DECT (DISPOSABLE) IMPLANT
SPONGE LAP 4X18 X RAY DECT (DISPOSABLE) IMPLANT
STAPLER VISISTAT 35W (STAPLE) IMPLANT
STOCKINETTE 8 INCH (MISCELLANEOUS) IMPLANT
SUCTION FRAZIER TIP 10 FR DISP (SUCTIONS) ×2 IMPLANT
SUT ETHIBOND NAB CT1 #1 30IN (SUTURE) IMPLANT
SUT ETHILON 2 0 PS N (SUTURE) ×6 IMPLANT
SUT PDS AB 1 CT1 27 (SUTURE) ×4 IMPLANT
SUT VIC AB 0 CT1 27 (SUTURE) ×2
SUT VIC AB 0 CT1 27XBRD ANTBC (SUTURE) ×2 IMPLANT
SUT VIC AB 1 CT1 27 (SUTURE) ×1
SUT VIC AB 1 CT1 27XBRD ANTBC (SUTURE) ×1 IMPLANT
SUT VIC AB 2-0 CT1 27 (SUTURE) ×1
SUT VIC AB 2-0 CT1 TAPERPNT 27 (SUTURE) ×1 IMPLANT
TOWEL OR 17X26 10 PK STRL BLUE (TOWEL DISPOSABLE) ×4 IMPLANT
WATER STERILE IRR 1500ML POUR (IV SOLUTION) IMPLANT

## 2013-11-23 NOTE — Transfer of Care (Signed)
Immediate Anesthesia Transfer of Care Note  Patient: Roger Ayers  Procedure(s) Performed: Procedure(s) (LRB): OPEN REDUCTION INTERNAL FIXATION (ORIF) RIGHT TIBIAL PLATEAU (Right) REMOVAL EXTERNAL FIXATION RIGHT LEG (Right)  Patient Location: PACU  Anesthesia Type: General  Level of Consciousness: sedated, patient cooperative and responds to stimulation  Airway & Oxygen Therapy: Patient Spontanous Breathing and Patient connected to face mask oxgen  Post-op Assessment: Report given to PACU RN and Post -op Vital signs reviewed and stable  Post vital signs: Reviewed and stable  Complications: No apparent anesthesia complications

## 2013-11-23 NOTE — H&P (Signed)
  See admission H&P from this admission.  No change.  He understands the severity of his right knee tibial plateau fracture and that we are going to the OR today for removal of the external fixator and ORIF of the right tibial plateau fracture.  Informed consent is obtained.

## 2013-11-23 NOTE — OR Nursing (Signed)
PT. STRAIGHT CATH @1320  FOR 150 ML CLEAR AMBER URINE

## 2013-11-23 NOTE — Brief Op Note (Signed)
11/17/2013 - 11/23/2013  12:37 PM  PATIENT:  Roger Ayers  47 y.o. male  PRE-OPERATIVE DIAGNOSIS:  RIGHT TIBIAL FRACTURE  POST-OPERATIVE DIAGNOSIS:  RIGHT TIBIAL FRACTURE  PROCEDURE:  Procedure(s): OPEN REDUCTION INTERNAL FIXATION (ORIF) RIGHT TIBIAL PLATEAU (Right) REMOVAL EXTERNAL FIXATION RIGHT LEG (Right)  SURGEON:  Surgeon(s) and Role:    * Kathryne Hitch, MD - Primary       Glee Arvin, MD - assistant surgeron  ASSISTANTS: none   ANESTHESIA:   general  EBL:  Total I/O In: 1000 [I.V.:1000] Out: 500 [Urine:500]  BLOOD ADMINISTERED:none  DRAINS: (Medium) Jackson-Pratt drain(s) with closed bulb suction in the soft tissue right knee   LOCAL MEDICATIONS USED:  NONE  SPECIMEN:  No Specimen  DISPOSITION OF SPECIMEN:  N/A  COUNTS:  YES  TOURNIQUET:   Total Tourniquet Time Documented: Thigh (Right) - 121 minutes Total: Thigh (Right) - 121 minutes   DICTATION: .Other Dictation: Dictation Number [id number]  PLAN OF CARE: Admit to inpatient   PATIENT DISPOSITION:  PACU - hemodynamically stable.   Delay start of Pharmacological VTE agent (>24hrs) due to surgical blood loss or risk of bleeding: no

## 2013-11-23 NOTE — Preoperative (Signed)
Beta Blockers   Reason not to administer Beta Blockers:Not Applicable 

## 2013-11-23 NOTE — Progress Notes (Signed)
Home cpap was setup for him on bedside table per his request.  Pt stated he does not need any other help and will place it on himself later when ready for bed.  Pt was advised that RT is available all night should he need further assistance.

## 2013-11-23 NOTE — Anesthesia Postprocedure Evaluation (Signed)
  Anesthesia Post-op Note  Patient: Roger Ayers  Procedure(s) Performed: Procedure(s) (LRB): OPEN REDUCTION INTERNAL FIXATION (ORIF) RIGHT TIBIAL PLATEAU (Right) REMOVAL EXTERNAL FIXATION RIGHT LEG (Right)  Patient Location: PACU  Anesthesia Type: General  Level of Consciousness: awake and alert   Airway and Oxygen Therapy: Patient Spontanous Breathing  Post-op Pain: mild  Post-op Assessment: Post-op Vital signs reviewed, Patient's Cardiovascular Status Stable, Respiratory Function Stable, Patent Airway and No signs of Nausea or vomiting  Last Vitals:  Filed Vitals:   11/23/13 1445  BP: 144/96  Pulse: 71  Temp:   Resp: 11    Post-op Vital Signs: stable   Complications: No apparent anesthesia complications. Seemed to do better today pain wise in the PACU. He was given toradol, ketamine and IV tylenol intraoperatively. Narcotics were titrated to a respiratory rate of 10. Still with some pain.

## 2013-11-23 NOTE — Anesthesia Preprocedure Evaluation (Addendum)
Anesthesia Evaluation  Patient identified by MRN, date of birth, ID band Patient awake    Reviewed: Allergy & Precautions, H&P , NPO status , Patient's Chart, lab work & pertinent test results  Airway Mallampati: II TM Distance: >3 FB Neck ROM: Full    Dental no notable dental hx.    Pulmonary Current Smoker,  CXR: Normal. breath sounds clear to auscultation  Pulmonary exam normal       Cardiovascular Exercise Tolerance: Good hypertension, Pt. on medications negative cardio ROS  Rhythm:Regular Rate:Normal  ECG: Normal. 2012   Neuro/Psych negative neurological ROS  negative psych ROS   GI/Hepatic negative GI ROS, Neg liver ROS,   Endo/Other  negative endocrine ROS  Renal/GU negative Renal ROS  negative genitourinary   Musculoskeletal negative musculoskeletal ROS (+)   Abdominal   Peds negative pediatric ROS (+)  Hematology negative hematology ROS (+)   Anesthesia Other Findings   Reproductive/Obstetrics negative OB ROS                         Anesthesia Physical Anesthesia Plan  ASA: II  Anesthesia Plan: General   Post-op Pain Management:    Induction: Intravenous  Airway Management Planned: Oral ETT  Additional Equipment:   Intra-op Plan:   Post-operative Plan: Extubation in OR  Informed Consent: I have reviewed the patients History and Physical, chart, labs and discussed the procedure including the risks, benefits and alternatives for the proposed anesthesia with the patient or authorized representative who has indicated his/her understanding and acceptance.   Dental advisory given  Plan Discussed with: CRNA  Anesthesia Plan Comments: (Lovenox 11-27 AM)       Anesthesia Quick Evaluation

## 2013-11-24 MED ORDER — ASPIRIN 325 MG PO TBEC: 325.0000 mg | DELAYED_RELEASE_TABLET | Freq: Two times a day (BID) | ORAL | Status: DC

## 2013-11-24 MED ORDER — PANTOPRAZOLE SODIUM 40 MG PO TBEC
40.0000 mg | DELAYED_RELEASE_TABLET | Freq: Every day | ORAL | Status: DC
  Administered 2013-11-24 – 2013-11-25 (×2): 40 mg via ORAL
  Filled 2013-11-24 (×3): qty 1

## 2013-11-24 MED ORDER — OXYCODONE-ACETAMINOPHEN 5-325 MG PO TABS: 1.0000 | ORAL_TABLET | ORAL | Status: DC | PRN

## 2013-11-24 MED ORDER — METHOCARBAMOL 500 MG PO TABS: 500.0000 mg | ORAL_TABLET | Freq: Four times a day (QID) | ORAL | Status: DC | PRN

## 2013-11-24 NOTE — Progress Notes (Signed)
Occupational Therapy Re-evaluation Patient Details Name: Roger Ayers MRN: [id number] DOB: [birth year] Today's Date: 11/24/2013 Time: 4098-1191 OT Time Calculation (min): 25 min  OT Assessment / Plan / Recommendation  History of present illness R bicondylar tibial plateau fx 2* fall from 10' ladder, s/p external fixation followed by ORIF 11/28   OT comments  Pt admitted with above.  Underwent surgery 11/28 with re-evaluation completed.   All education completed with pt and wife.  He will need a 3in1 commode.  No futher OT needs identified, will sign off.   Follow Up Recommendations  No OT follow up;Supervision/Assistance - 24 hour    Barriers to Discharge       Equipment Recommendations  3 in 1 bedside comode    Recommendations for Other Services    Frequency     Progress towards OT Goals Progress towards OT goals: Goals met/education completed, patient discharged from OT  Plan All goals met and education completed, patient discharged from OT services    Precautions / Restrictions Precautions Precautions: Fall Required Braces or Orthoses: Other Brace/Splint Other Brace/Splint: Bledsoe RLE, 0-45* Restrictions Weight Bearing Restrictions: Yes RUE Weight Bearing: Non weight bearing RLE Weight Bearing: Non weight bearing   Pertinent Vitals/Pain     ADL  Eating/Feeding: Independent Where Assessed - Eating/Feeding: Chair Grooming: Wash/dry hands;Wash/dry face;Teeth care;Set up Where Assessed - Grooming: Supported sitting Upper Body Bathing: Set up Where Assessed - Upper Body Bathing: Supported sitting Lower Body Bathing: Minimal assistance Where Assessed - Lower Body Bathing: Supported sit to stand Upper Body Dressing: Set up Where Assessed - Upper Body Dressing: Unsupported sitting Lower Body Dressing: Moderate assistance Where Assessed - Lower Body Dressing: Supported sit to stand Toilet Transfer: Hydrographic surveyor Method: Sit to stand;Stand pivot Product/process development scientist: Raised toilet seat with arms (or 3-in-1 over toilet);Comfort height toilet;Grab bars Toileting - Clothing Manipulation and Hygiene: Min guard Where Assessed - Engineer, mining and Hygiene: Standing Tub/Shower Transfer: Min Psychologist, prison and probation services Method: Science writer: Walk in shower (using 3in1) Equipment Used: Long-handled shoe horn;Long-handled sponge;Reacher;Rolling walker Transfers/Ambulation Related to ADLs: min guard assist ADL Comments: Pt has access to shower seat.  Simulated shower transfer onto shower seat, but pt unable to safely perform without arm rests for support when moving sit<>Stand.  Discussed options and pt will use 3in1 in shower.  Also discussed safe method for LB dressing and showering - wear brace into and out shower - remove once seated    OT Diagnosis:    OT Problem List:   OT Treatment Interventions:     OT Goals(current goals can now be found in the care plan section)    Visit Information  Last OT Received On: 11/24/13 Assistance Needed: +1 History of Present Illness: R bicondylar tibial plateau fx 2* fall from 10' ladder, s/p external fixation followed by ORIF 11/28    Subjective Data      Prior Functioning       Cognition  Cognition Arousal/Alertness: Awake/alert Behavior During Therapy: WFL for tasks assessed/performed Overall Cognitive Status: Within Functional Limits for tasks assessed    Mobility  Bed Mobility Bed Mobility: Not assessed Transfers Transfers: Sit to Stand;Stand to Sit Sit to Stand: 5: Supervision;With upper extremity assist;From chair/3-in-1 Stand to Sit: 5: Supervision;With upper extremity assist;To chair/3-in-1 Details for Transfer Assistance: supervision 2* fall risk due to NWB status    Exercises      Balance     End of Session OT - End of  Session Equipment Utilized During Treatment: Rolling walker Activity Tolerance: Patient tolerated treatment  well Patient left: in chair;with call bell/phone within reach;with family/visitor present Nurse Communication: Mobility status  GO     Kaitelyn Jamison M 11/24/2013, 2:25 PM

## 2013-11-24 NOTE — Op Note (Signed)
[name], MINDER NO.:  [id number]  MEDICAL RECORD NO.:  [id number]  LOCATION:  1533                         FACILITY:  [facility]  PHYSICIAN:  Vanita Panda. Magnus Ivan, M.D.DATE OF BIRTH:  05/12/1966  DATE OF PROCEDURE:  11/23/2013 DATE OF DISCHARGE:                              OPERATIVE REPORT   PREOPERATIVE DIAGNOSES: 1. Right knee bicondylar tibial plateau fracture with severe     comminution. 2. Status post spanning external fixation of right knee joint.  POSTOPERATIVE DIAGNOSES: 1. Right knee bicondylar tibial plateau fracture with severe     comminution. 2. Status post spanning external fixation of right knee joint.  PROCEDURE: 1. Removal of spanning external fixator, right knee. 2. Open reduction and internal fixation of right bicondylar tibial     plateau fracture with plate and screws and cortical cancellous bone     chips as bone graft filler.  SURGEON:  Doneen Poisson, MD.  ASSISTANT SURGEON:  Glee Arvin, MD.  ANESTHESIA:  General.  TOURNIQUET TIME:  Just under 2 hours.  BLOOD LOSS:  100-150 mL.  COMPLICATIONS:  None.  INDICATIONS:  Roger Ayers is a 47 year old who then of last week was working on a ladder and fell off a ladder about 10 feet landing on his right knee.  He sustained a bicondylar tibial plateau fracture with significant swelling of the knee and depression of the lateral articular joint space and significant loss of articular cartilage on the lateral side.  At the time of his injury, he underwent spanning external fixation to allow soft tissues to calm down and is presenting today for definitive fixation of the tibial plateau fracture.  He understands the high risk of postoperative arthritis in that knee due to severe loss of cartilage on the lateral joint line and all the x-rays and CT scan have been shared with him.  PROCEDURE DESCRIPTION:  After informed consent was obtained, appropriate right leg was marked.   He was brought to the operating room, placed supine on the operating table.  General anesthesia was then obtained. We 1st removed the external fixator in its entirety from his leg.  We then prepped a right leg from the thigh down the toes with Betadine and sterile drapes were applied.  Time-out was called to identify correct patient, correct right leg.  We then curetted out all the external fixation pin sites and proximally closed with 2-0 nylon suture, the external fixation pin sites in the thigh.  We then placed a sterile tourniquet around his upper right leg and then wrapped the lower leg out with Esmarch and tourniquet was inflated to 300 mm of pressure.  We then made a curvilinear incision at the knee and carried this from the thigh down to the mid tibia.  We created a flap of soft tissue from the fibular head, down the leg to expose the fracture, we then found the lateral side to be significantly comminuted.  Using fluoroscopic guidance, we tried to reduce the fractures as much as possible and then felt we could not get the fracture reduced, it is likely that the lateral meniscus within fracture pieces in that significant comminution. We then performed a submeniscal approach to the  knee and put some temporary sutures around this, so we could at least tried to get some joint pieces together.  We then placed temporary K-wires to hold the joint piece together as much as we could and then backfilled the deficit in the knee with a cortical cancellous bone chips.  Once this was accomplished, we placed a Biomet periarticular tibial plateau plate for the right proximal tibia and secured this using reduction clamps proximally to pull the joint together.  We then sealed with locking screws proximally and bicortical screws distally with 1 locking screw distally as well to hold the fracture as reduced as possible.  We then irrigated the joint and repaired the submeniscal tissue.  We  then irrigated the tissues significantly with low normal saline solution and closed the deep tissue with 0 Vicryl over the plate followed by 2-0 Vicryl in subcutaneous tissue, and staples on the skin.  Xeroform, well- padded sterile dressing was applied.  A medium Hemovac drain was also placed in the wound.  He is awake and extubated, and taken to the recovery room in stable condition.  All final counts were correct. There were no complications noted.     Vanita Panda. Magnus Ivan, M.D.     CYB/MEDQ  D:  11/23/2013  T:  11/24/2013  Job:  [id number]

## 2013-11-24 NOTE — Evaluation (Addendum)
Physical Therapy Evaluation Patient Details Name: Roger Ayers MRN: [id number] DOB: 09-Jun-1966 Today's Date: 11/24/2013 Time: 7829-5621 PT Time Calculation (min): 34 min  PT Assessment / Plan / Recommendation History of Present Illness  R bicondylar tibial plateau fx 2* fall from 10' ladder, s/p external fixation followed by ORIF 11/28  Clinical Impression  *Patient is s/p RLE ORIF* surgery resulting in the deficits listed below (see PT Problem List).  Patient will benefit from skilled PT to increase their independence and safety with mobility  to allow discharge  **    PT Assessment  Patient needs continued PT services    Follow Up Recommendations  Home health PT    Does the patient have the potential to tolerate intense rehabilitation      Barriers to Discharge        Equipment Recommendations  Rolling walker with 5" wheels;Wheelchair (measurements PT) [facility] with elevating leg rest)    Recommendations for Other Services     Frequency Min 6X/week    Precautions / Restrictions Precautions Precautions: Fall Restrictions Weight Bearing Restrictions: Yes RLE Weight Bearing: Non weight bearing  Bledsoe Brace, 0-45* flexion  Pertinent Vitals/Pain 8/10 RLE Premedicated Ice applied Pt feels pain control is not effective. RN notified.       Mobility  Bed Mobility Bed Mobility: Not assessed Transfers Transfers: Sit to Stand;Stand to Sit Sit to Stand: 5: Supervision;From chair/3-in-1 Stand to Sit: 5: Supervision;To chair/3-in-1 Details for Transfer Assistance: supervision 2* fall risk due to NWB status Ambulation/Gait Ambulation/Gait Assistance: 4: Min guard Ambulation/Gait: Patient Percentage: 90% Ambulation Distance (Feet): 120 Feet Assistive device: Rolling walker Gait Pattern: Step-to pattern Gait velocity: decreased General Gait Details: distance limited by pain/fatigue    Exercises General Exercises - Lower Extremity Ankle Circles/Pumps: AROM;Right;5  reps Quad Sets: AROM;Both;5 reps (Attempted Quad Set RLE but too painful) Heel Slides: AAROM;Right;10 reps;Supine Straight Leg Raises: AAROM;Right;5 reps;Supine   PT Diagnosis: Difficulty walking;Acute pain  PT Problem List: Decreased activity tolerance;Decreased strength;Decreased mobility;Pain;Decreased range of motion PT Treatment Interventions: DME instruction;Gait training;Stair training;Functional mobility training;Therapeutic exercise;Therapeutic activities     PT Goals(Current goals can be found in the care plan section) Acute Rehab PT Goals Patient Stated Goal: return to work PT Goal Formulation: With patient Time For Goal Achievement: 12/02/13 Potential to Achieve Goals: Good  Visit Information  Last PT Received On: 11/24/13 Assistance Needed: +1 History of Present Illness: R bicondylar tibial plateau fx 2* fall from 10' ladder, s/p external fixation followed by ORIF 11/28       Prior Functioning  Home Living Family/patient expects to be discharged to:: Private residence Living Arrangements: Spouse/significant other Available Help at Discharge: Family Type of Home: House Home Access: Stairs to enter Secretary/administrator of Steps: 3 Entrance Stairs-Rails: None Home Layout: One level Home Equipment: Crutches Prior Function Level of Independence: Independent Communication Communication: No difficulties    Cognition  Cognition Arousal/Alertness: Awake/alert Behavior During Therapy: WFL for tasks assessed/performed Overall Cognitive Status: Within Functional Limits for tasks assessed    Extremity/Trunk Assessment Lower Extremity Assessment RLE Deficits / Details: able to actively PF/DF R ankle, knee flexion AAROM 15* limited by pain, Hip flexion -2/5 Cervical / Trunk Assessment Cervical / Trunk Assessment: Normal   Balance    End of Session PT - End of Session Equipment Utilized During Treatment: Gait belt Activity Tolerance: Patient limited by  fatigue;Patient limited by pain Patient left: in chair;with call bell/phone within reach Nurse Communication: Mobility status  GP     Roger Ayers,  Roger Ayers 11/24/2013, 8:44 AM [phone]

## 2013-11-24 NOTE — Progress Notes (Signed)
Subjective: Pt stable - pain mod well controlled   Objective: Vital signs in last 24 hours: Temp:  [97.8 F (36.6 C)-99 F (37.2 C)] 99 F (37.2 C) (11/29 0500) Pulse Rate:  [60-103] 93 (11/29 0500) Resp:  [6-20] 20 (11/29 0500) BP: (124-167)/(56-96) 154/95 mmHg (11/29 0500) SpO2:  [97 %-100 %] 99 % (11/29 0500)  Intake/Output from previous day: 11/28 0701 - 11/29 0700 In: 3390 [P.O.:120; I.V.:3170; IV Piggyback:100] Out: 4325 [Urine:4050; Drains:250; Blood:25] Intake/Output this shift: Total I/O In: 240 [P.O.:240] Out: -   Exam:  Dorsiflexion/Plantar flexion intact Compartment soft  Labs: No results found for this basename: HGB,  in the last 72 hours No results found for this basename: WBC, RBC, HCT, PLT,  in the last 72 hours No results found for this basename: NA, K, CL, CO2, BUN, CREATININE, GLUCOSE, CALCIUM,  in the last 72 hours No results found for this basename: LABPT, INR,  in the last 72 hours  Assessment/Plan: Plan to observe today - change dressing and dc am   DEAN,GREGORY SCOTT 11/24/2013, 9:50 AM

## 2013-11-25 NOTE — Progress Notes (Addendum)
   CARE MANAGEMENT NOTE 11/25/2013  Patient:  Roger Ayers, Roger Ayers   Account Number:  [id number]  Date Initiated:  11/18/2013  Documentation initiated by:  [facility]  Subjective/Objective Assessment:   External fixation right tibial plateau fracture multiplane;     Action/Plan:   HH waiting PT/OT final recommendations   Anticipated DC Date:  11/25/2013   Anticipated DC Plan:  HOME W HOME HEALTH SERVICES      DC Planning Services  CM consult      [facility] Choice  HOME HEALTH   Choice offered to / List presented to:  C-1 Patient        HH arranged  HH-2 PT      [facility] agency  [facility]   Status of service:  In process, will continue to follow Medicare Important Message given?   (If response is "NO", the following Medicare IM given date fields will be blank) Date Medicare IM given:   Date Additional Medicare IM given:    Discharge Disposition:  HOME W HOME HEALTH SERVICES  Per UR Regulation:    If discussed at Long Length of Stay Meetings, dates discussed:    Comments:  11/25/2013 1300 NCM spoke to Dr. August Saucer to clarify [facility] PT order. Order given for [facility] PT. [facility] delivered 3n1 to room. Pt states he has RW at home. Pt agreeable to [facility] for [facility] PT. [facility] notified of referral. Isidoro Donning RN CCM Case Mgmt phone [phone]  11/26//2014 Colleen Can BSN RN CCM [phone] CM spoke with patient. Current plans are for return to his home where spouse and mother will be caregivers. External fixatoe in place. [name] will need rw and 3n1. Will follow for other orders.  11/18/2013 1500 Provided pt with [facility] list and offered choice. Pt will look list over. NCM will continue to follow for final PT/OT recommendations for home for [facility] and DME. Will need HH orders at dc.  Isidoro Donning RN CCM Case Mgmt phone [phone].

## 2013-11-25 NOTE — Progress Notes (Signed)
Pt stable - pain controlled Incision ok dressing changed Dc today

## 2013-11-25 NOTE — Progress Notes (Signed)
Physical Therapy Treatment Patient Details Name: Roger Ayers MRN: [id number] DOB: [birth year] Today's Date: 11/25/2013 Time: 0900-0930 PT Time Calculation (min): 30 min  PT Assessment / Plan / Recommendation  History of Present Illness R bicondylar tibial plateau fx 2* fall from 10' ladder, s/p external fixation followed by ORIF 11/28   PT Comments   Pt plans to D/C to home today so practiced going up 4 steps.  Handout also given.  Instructed pt on proper placement of knee hinge brace and use of ice.    Follow Up Recommendations  Home health PT     Does the patient have the potential to tolerate intense rehabilitation     Barriers to Discharge        Equipment Recommendations   (pt borrowing RW but would like a 3:1)    Recommendations for Other Services    Frequency     Progress towards PT Goals Progress towards PT goals: Progressing toward goals  Plan Current plan remains appropriate    Precautions / Restrictions Precautions Precautions: Fall Required Braces or Orthoses: Other Brace/Splint Other Brace/Splint: Bledsoe RLE Restrictions Weight Bearing Restrictions: Yes RLE Weight Bearing: Non weight bearing    Pertinent Vitals/Pain C/o 6/10 knee pain after session Repositioned and applied ICE    Mobility  Bed Mobility Bed Mobility: Supine to Sit Supine to Sit: 5: Supervision Details for Bed Mobility Assistance: Pt used his leg lifter to self assist R LE off bed Transfers Transfers: Sit to Stand;Stand to Sit Sit to Stand: 5: Supervision;From bed;From chair/3-in-1 Stand to Sit: 5: Supervision;To chair/3-in-1 Details for Transfer Assistance: increased time Ambulation/Gait Ambulation/Gait Assistance: 5: Supervision Ambulation Distance (Feet): 45 Feet Assistive device: Rolling walker Ambulation/Gait Assistance Details: increased time Gait Pattern: Step-to pattern Gait velocity: decreased Stairs: Yes Stairs Assistance: 4: Min assist Stairs Assistance Details  (indicate cue type and reason): up backward 2nd no rails and NWB Stair Management Technique: No rails;Backwards Number of Stairs: 4    PT Goals (current goals can now be found in the care plan section)    Visit Information  Last PT Received On: 11/25/13 Assistance Needed: +1 History of Present Illness: R bicondylar tibial plateau fx 2* fall from 10' ladder, s/p external fixation followed by ORIF 11/28    Subjective Data      Cognition       Balance     End of Session PT - End of Session Equipment Utilized During Treatment: Gait belt Activity Tolerance: Patient tolerated treatment well Patient left: in chair;with call bell/phone within reach   Felecia Shelling  PTA [facility]  Acute  Rehab Pager      [phone]

## 2013-11-25 NOTE — Progress Notes (Signed)
Discharged from floor via w/c, wife with pt. No changes in assessment. Jady Braggs  

## 2013-11-25 NOTE — Discharge Summary (Signed)
Patient ID: Roger Ayers MRN: [id number] DOB/AGE: 1966/07/17 47 y.o.  Admit date: 11/17/2013 Discharge date: 11/25/2013  Admission Diagnoses:  Principal Problem:   Tibial plateau fracture   Discharge Diagnoses:  Same  Past Medical History  Diagnosis Date  . Hypertension     Surgeries: Procedure(s): OPEN REDUCTION INTERNAL FIXATION (ORIF) RIGHT TIBIAL PLATEAU REMOVAL EXTERNAL FIXATION RIGHT LEG on 11/17/2013 - 11/23/2013   Consultants:    Discharged Condition: Improved  Hospital Course: Dreydon Cardenas is an 47 y.o. male who was admitted 11/17/2013 for operative treatment ofTibial plateau fracture. Patient has severe unremitting pain that affects sleep, daily activities, and work/hobbies. After pre-op clearance the patient was taken to the operating room on 11/17/2013 - 11/23/2013 and underwent  Procedure(s): OPEN REDUCTION INTERNAL FIXATION (ORIF) RIGHT TIBIAL PLATEAU REMOVAL EXTERNAL FIXATION RIGHT LEG.    Patient was given perioperative antibiotics: Anti-infectives   Start     Dose/Rate Route Frequency Ordered Stop   11/23/13 1600  ceFAZolin (ANCEF) IVPB 1 g/50 mL premix     1 g 100 mL/hr over 30 Minutes Intravenous Every 6 hours 11/23/13 1455 11/24/13 0431   11/23/13 1030  ceFAZolin (ANCEF) IVPB 2 g/50 mL premix     2 g 100 mL/hr over 30 Minutes Intravenous  Once 11/23/13 1018 11/23/13 1010   11/17/13 2359  ceFAZolin (ANCEF) IVPB 2 g/50 mL premix     2 g 100 mL/hr over 30 Minutes Intravenous Every 6 hours 11/17/13 2138 11/18/13 1229   11/17/13 1800  ceFAZolin (ANCEF) IVPB 2 g/50 mL premix  Status:  Discontinued     2 g 100 mL/hr over 30 Minutes Intravenous Every 8 hours 11/17/13 1729 11/17/13 2150       Patient was given sequential compression devices, early ambulation, and chemoprophylaxis to prevent DVT.  Patient benefited maximally from hospital stay and there were no complications.    Recent vital signs: Patient Vitals for the past 24 hrs:  BP Temp  Temp src Pulse Resp SpO2  11/25/13 0502 148/90 mmHg 98.6 F (37 C) Oral 77 18 95 %  11/24/13 2200 152/82 mmHg 99.1 F (37.3 C) Oral 88 18 97 %     Recent laboratory studies: No results found for this basename: WBC, HGB, HCT, PLT, NA, K, CL, CO2, BUN, CREATININE, GLUCOSE, PT, INR, CALCIUM, 2,  in the last 72 hours   Discharge Medications:     Medication List         aspirin 325 MG EC tablet  Take 1 tablet (325 mg total) by mouth 2 (two) times daily after a meal.     lisinopril 5 MG tablet  Commonly known as:  PRINIVIL,ZESTRIL  Take 5 mg by mouth every morning.     methocarbamol 500 MG tablet  Commonly known as:  ROBAXIN  Take 1 tablet (500 mg total) by mouth every 6 (six) hours as needed for muscle spasms.     omeprazole 20 MG capsule  Commonly known as:  PRILOSEC  Take 20 mg by mouth every morning.     oxyCODONE-acetaminophen 5-325 MG per tablet  Commonly known as:  ROXICET  Take 1-2 tablets by mouth every 4 (four) hours as needed for severe pain.        Diagnostic Studies: Dg Femur Right  11/17/2013   CLINICAL DATA:  External fixator placement  EXAM: RIGHT FEMUR - 2 VIEW  COMPARISON:  11/17/2013  FINDINGS: Multiple intraoperative spot images are submitted demonstrating placement of external fixator device across the right knee and  tibial plateau fractures. No hardware or bony complicating feature.  IMPRESSION: External fixator placement across the right knee.   Electronically Signed   By: Charlett Nose M.D.   On: 11/17/2013 19:36   Dg Tibia/fibula Right  11/23/2013   CLINICAL DATA:  Right tibial plateau fracture  EXAM: RIGHT TIBIA AND FIBULA - 2 VIEW  COMPARISON:  Right knee radiographs - 11/17/2013; right knee CT -11/17/2013  FLUOROSCOPY TIME:  3 min, 4 seconds  FINDINGS: Four spot intraoperative radiographic images of the right knee are provided for review.  Images demonstrate the sequela of sideplate fixation of previously noted comminuted and displaced fracture of the  tibial plateau. Alignment now appears near anatomic.  Grossly unchanged appearance of minimally displaced fracture involving the proximal fibular head.  There is a minimal amount of expected subcutaneous emphysema and intra-articular air about the operative site. No radiopaque foreign body.  IMPRESSION: 1. Post ORIF of comminuted, displaced tibial plateau fracture without evidence complication. 2. Unchanged appearance of the minimally displace fracture involving the proximal fibula.   Electronically Signed   By: Simonne Come M.D.   On: 11/23/2013 12:49   Dg Tibia/fibula Right  11/17/2013   CLINICAL DATA:  External fixator.  Tibial plateau fracture.  EXAM: RIGHT TIBIA AND FIBULA - 2 VIEW; DG C-ARM 1-60 MIN - NRPT [facility]  COMPARISON:  Plain films and CT 11/17/2013  FINDINGS: Multiple intraoperative spot images are submitted demonstrating placement of external fixator device across the right knee and tibial plateau fractures. No hardware or bony complicating feature.  IMPRESSION: External fixator placement across the right knee.   Electronically Signed   By: Charlett Nose M.D.   On: 11/17/2013 19:35   Ct Knee Right Wo Contrast  11/17/2013   CLINICAL DATA:  Right knee tibial plateau fracture  EXAM: CT OF THE RIGHT KNEE WITHOUT CONTRAST  TECHNIQUE: Multidetector CT imaging was performed according to the standard protocol. Multiplanar CT image reconstructions were also generated.  COMPARISON:  Radiographic series dated 11/17/2013  FINDINGS: A comminuted fracture is identified involving the lateral tibial plateau. Areas of distraction appreciated maximal diameter of 3.5 cm. There are areas of depression in the posterior aspect of fracture remaining tooth of approximately 1 cm. The fracture extends into the tibial spine region as well as into the medial tibial plateau region. The medial tibial plateau fracture is also comminuted and demonstrates minimal depression of 2-3 mm. There is a vertical component extending into  the proximal tibial shaft. An impacted comminuted fibular head fracture is appreciated. There does not appear to be significant displacement.  IMPRESSION: 1. Lateral and medial tibial plateau fractures with distraction and areas of depression. 2. Comminuted fibular head fracture.   Electronically Signed   By: Salome Holmes M.D.   On: 11/17/2013 16:16   Ct 3d Recon At Scanner  11/17/2013   CLINICAL DATA:  Right tibial plateau fractures  EXAM: 3-DIMENSIONAL CT IMAGE RENDERING ON ACQUISITION WORKSTATION  TECHNIQUE: 3-dimensional CT images were rendered by post-processing of the original CT data on an acquisition workstation. The 3-dimensional CT images were interpreted and findings were reported in the accompanying complete CT report for this study  COMPARISON:  Plain films and CT 11/17/2013  FINDINGS: 3D reconstructed images were performed of the right knee and again demonstrate the comminuted medial and lateral tibial plateau fractures as well as fibular head fractures.  IMPRESSION: Again noted are the comminuted medial and lateral tibial plateau fractures and proximal fibular fracture.   Electronically Signed  By: Charlett Nose M.D.   On: 11/17/2013 18:19   Dg Chest Port 1 View  11/17/2013   CLINICAL DATA:  Hypertension.  EXAM: PORTABLE CHEST - 1 VIEW  COMPARISON:  None.  FINDINGS: The heart size and mediastinal contours are within normal limits. Both lungs are clear. The visualized skeletal structures are unremarkable.  IMPRESSION: No active disease.   Electronically Signed   By: Roque Lias M.D.   On: 11/17/2013 15:35   Dg Knee Complete 4 Views Right  11/17/2013   CLINICAL DATA:  History of trauma.  EXAM: RIGHT KNEE - COMPLETE 4+ VIEW  COMPARISON:  None.  FINDINGS: A comminuted lateral tibial plateau fracture identified demonstrating a next anterior/ is lateral displacement, 3 cm of distraction, mild depression, intra-articular extension is. The fracture line appears extending from the lateral tibial  spine to the peripheral cortical base. An impacted comminuted fracture identified involving the proximal fibula.  IMPRESSION: 1. Lateral tibial plateau fracture 2. Comminuted impacted proximal fibular head fracture.   Electronically Signed   By: Salome Holmes M.D.   On: 11/17/2013 14:39   Dg Knee Right Port  11/23/2013   CLINICAL DATA:  Postop right knee surgery  EXAM: PORTABLE RIGHT KNEE - 1-2 VIEW  COMPARISON:  Portable AP and lateral radiographs compared intraoperative images of 11/23/2013  FINDINGS: Lateral buttress plate and multiple screws are identified across reduced fracture of the lateral tibial plateau.  Significantly improved alignment of lateral plateau articular surface versus preoperative evaluation.  Fibular neck fracture identified.  Femur appears intact as does patella.  No additional focal bony abnormalities identified.  IMPRESSION: Markedly improved alignment of fracture fragments at the lateral tibial plateau post ORIF.   Electronically Signed   By: Ulyses Southward M.D.   On: 11/23/2013 14:03   Dg Hand Complete Left  11/17/2013   CLINICAL DATA:  Fall.  EXAM: LEFT HAND - COMPLETE 3+ VIEW  COMPARISON:  None.  FINDINGS: There is no evidence of fracture or dislocation. There is no evidence of arthropathy or other focal bone abnormality. Soft tissues are unremarkable.  IMPRESSION: Negative.   Electronically Signed   By: Charlett Nose M.D.   On: 11/17/2013 16:31   Dg C-arm 1-60 Min-no Report  11/17/2013   CLINICAL DATA:  External fixator.  Tibial plateau fracture.  EXAM: RIGHT TIBIA AND FIBULA - 2 VIEW; DG C-ARM 1-60 MIN - [facility]PT [facility]  COMPARISON:  Plain films and CT 11/17/2013  FINDINGS: Multiple intraoperative spot images are submitted demonstrating placement of external fixator device across the right knee and tibial plateau fractures. No hardware or bony complicating feature.  IMPRESSION: External fixator placement across the right knee.   Electronically Signed   By: Charlett Nose M.D.   On:  11/17/2013 19:35   Dg C-arm 61-120 Min-no Report  11/23/2013   CLINICAL DATA: ORIF RIGHT TIBIA   C-ARM 61-120 MINUTES  Fluoroscopy was utilized by the requesting physician.  No radiographic  interpretation.     Disposition: 01-Home or Self Care      Discharge Orders   Future Orders Complete By Expires   Call MD / Call 911  As directed    Comments:     If you experience chest pain or shortness of breath, CALL 911 and be transported to the hospital emergency room.  If you develope a fever above 101 F, pus (white drainage) or increased drainage or redness at the wound, or calf pain, call your surgeon's office.   Call MD /  Call 911  As directed    Comments:     If you experience chest pain or shortness of breath, CALL 911 and be transported to the hospital emergency room.  If you develope a fever above 101 F, pus (white drainage) or increased drainage or redness at the wound, or calf pain, call your surgeon's office.   Constipation Prevention  As directed    Comments:     Drink plenty of fluids.  Prune juice may be helpful.  You may use a stool softener, such as Colace (over the counter) 100 mg twice a day.  Use MiraLax (over the counter) for constipation as needed.   Constipation Prevention  As directed    Comments:     Drink plenty of fluids.  Prune juice may be helpful.  You may use a stool softener, such as Colace (over the counter) 100 mg twice a day.  Use MiraLax (over the counter) for constipation as needed.   Diet - low sodium heart healthy  As directed    Diet - low sodium heart healthy  As directed    Discharge instructions  As directed    Comments:     No weight on your right leg at all until further notice. Leave your current dressing on and in place for the next 5 days. Ice and elevation as needed. Occasionally pump your feet throughout the day. You can come out of your brace when sitting or lying down. You can get your actual incisions wet in the shower starting 11/29/13;  then daily dry dressings.   Increase activity slowly as tolerated  As directed    Increase activity slowly as tolerated  As directed       Follow-up Information   Follow up with Kathryne Hitch, MD In 2 weeks.   Specialty:  Orthopedic Surgery   Contact information:   [address] [place] [address] [phone]       Follow up with [facility]. [facility])    Contact information:   [address] [place] [address] [phone]        Signed: Kathryne Hitch 11/25/2013, 4:34 PM

## 2013-11-26 ENCOUNTER — Encounter (HOSPITAL_COMMUNITY): Payer: Self-pay | Admitting: Orthopaedic Surgery

## 2013-12-05 ENCOUNTER — Inpatient Hospital Stay (HOSPITAL_COMMUNITY): Payer: BC Managed Care – PPO

## 2013-12-05 ENCOUNTER — Inpatient Hospital Stay (HOSPITAL_COMMUNITY)
Admission: AD | Admit: 2013-12-05 | Discharge: 2013-12-10 | DRG: 560 | Disposition: A | Discharge status: Home Health | Payer: BC Managed Care – PPO | Source: Ambulatory Visit | Attending: Orthopaedic Surgery | Admitting: Orthopaedic Surgery

## 2013-12-05 ENCOUNTER — Encounter (HOSPITAL_COMMUNITY): Payer: Self-pay | Admitting: *Deleted

## 2013-12-05 ENCOUNTER — Encounter (HOSPITAL_COMMUNITY): Payer: Self-pay | Admitting: Pharmacy Technician

## 2013-12-05 ENCOUNTER — Inpatient Hospital Stay (HOSPITAL_COMMUNITY): Payer: BC Managed Care – PPO | Admitting: Anesthesiology

## 2013-12-05 ENCOUNTER — Encounter (HOSPITAL_COMMUNITY)
Admission: AD | Disposition: A | Discharge status: Home Health | Payer: Self-pay | Source: Ambulatory Visit | Attending: Orthopaedic Surgery

## 2013-12-05 ENCOUNTER — Other Ambulatory Visit (HOSPITAL_COMMUNITY): Payer: Self-pay | Admitting: Orthopaedic Surgery

## 2013-12-05 ENCOUNTER — Encounter (HOSPITAL_COMMUNITY): Payer: BC Managed Care – PPO | Admitting: Anesthesiology

## 2013-12-05 DIAGNOSIS — M869 Osteomyelitis, unspecified: Secondary | ICD-10-CM

## 2013-12-05 DIAGNOSIS — Z7982 Long term (current) use of aspirin: Secondary | ICD-10-CM

## 2013-12-05 DIAGNOSIS — S82109A Unspecified fracture of upper end of unspecified tibia, initial encounter for closed fracture: Secondary | ICD-10-CM | POA: Diagnosis present

## 2013-12-05 DIAGNOSIS — S8290XS Unspecified fracture of unspecified lower leg, sequela: Secondary | ICD-10-CM

## 2013-12-05 DIAGNOSIS — W19XXXS Unspecified fall, sequela: Secondary | ICD-10-CM

## 2013-12-05 DIAGNOSIS — T847XXA Infection and inflammatory reaction due to other internal orthopedic prosthetic devices, implants and grafts, initial encounter: Principal | ICD-10-CM | POA: Diagnosis present

## 2013-12-05 DIAGNOSIS — I10 Essential (primary) hypertension: Secondary | ICD-10-CM | POA: Diagnosis present

## 2013-12-05 DIAGNOSIS — F17200 Nicotine dependence, unspecified, uncomplicated: Secondary | ICD-10-CM | POA: Diagnosis present

## 2013-12-05 DIAGNOSIS — Z79899 Other long term (current) drug therapy: Secondary | ICD-10-CM

## 2013-12-05 DIAGNOSIS — K219 Gastro-esophageal reflux disease without esophagitis: Secondary | ICD-10-CM | POA: Diagnosis present

## 2013-12-05 DIAGNOSIS — G4730 Sleep apnea, unspecified: Secondary | ICD-10-CM | POA: Diagnosis present

## 2013-12-05 HISTORY — PX: I & D EXTREMITY: SHX5045

## 2013-12-05 HISTORY — DX: Sleep apnea, unspecified: G47.30

## 2013-12-05 HISTORY — DX: Gastro-esophageal reflux disease without esophagitis: K21.9

## 2013-12-05 LAB — BASIC METABOLIC PANEL
BUN: 21 mg/dL (ref 6–23)
CO2: 25 mEq/L (ref 19–32)
Calcium: 8.6 mg/dL (ref 8.4–10.5)
Creatinine, Ser: 1.09 mg/dL (ref 0.50–1.35)
Glucose, Bld: 112 mg/dL — ABNORMAL HIGH (ref 70–99)

## 2013-12-05 LAB — CBC
MCH: 32 pg (ref 26.0–34.0)
MCHC: 33.6 g/dL (ref 30.0–36.0)
MCV: 95.3 fL (ref 78.0–100.0)
Platelets: 433 10*3/uL — ABNORMAL HIGH (ref 150–400)
RBC: 3.62 MIL/uL — ABNORMAL LOW (ref 4.22–5.81)
RDW: 12.8 % (ref 11.5–15.5)

## 2013-12-05 SURGERY — IRRIGATION AND DEBRIDEMENT EXTREMITY
Anesthesia: General | Site: Leg Lower | Laterality: Right

## 2013-12-05 MED ORDER — HYDROMORPHONE HCL PF 1 MG/ML IJ SOLN
INTRAMUSCULAR | Status: AC
  Filled 2013-12-05: qty 1

## 2013-12-05 MED ORDER — SODIUM CHLORIDE 0.9 % IR SOLN
Status: DC | PRN
  Administered 2013-12-05 (×5): 1000 mL

## 2013-12-05 MED ORDER — MIDAZOLAM HCL 5 MG/5ML IJ SOLN
INTRAMUSCULAR | Status: DC | PRN
  Administered 2013-12-05: 2 mg via INTRAVENOUS

## 2013-12-05 MED ORDER — HYDROMORPHONE HCL PF 1 MG/ML IJ SOLN
1.0000 mg | INTRAMUSCULAR | Status: DC | PRN
  Administered 2013-12-06 (×2): 2 mg via INTRAVENOUS
  Administered 2013-12-08: 1 mg via INTRAVENOUS
  Administered 2013-12-08: 2 mg via INTRAVENOUS
  Administered 2013-12-08 (×2): 1 mg via INTRAVENOUS
  Administered 2013-12-08 – 2013-12-10 (×4): 2 mg via INTRAVENOUS
  Filled 2013-12-05: qty 2
  Filled 2013-12-05: qty 1
  Filled 2013-12-05 (×2): qty 2
  Filled 2013-12-05: qty 1
  Filled 2013-12-05 (×5): qty 2

## 2013-12-05 MED ORDER — DIPHENHYDRAMINE HCL 12.5 MG/5ML PO ELIX: 12.5000 mg | ORAL_SOLUTION | ORAL | Status: DC | PRN

## 2013-12-05 MED ORDER — DIAZEPAM 5 MG/ML IJ SOLN
INTRAMUSCULAR | Status: AC
  Filled 2013-12-05: qty 2

## 2013-12-05 MED ORDER — OXYCODONE HCL 5 MG/5ML PO SOLN: 5.0000 mg | Freq: Once | ORAL | Status: DC | PRN

## 2013-12-05 MED ORDER — HYDROMORPHONE HCL PF 1 MG/ML IJ SOLN
0.2500 mg | INTRAMUSCULAR | Status: DC | PRN
  Administered 2013-12-05 (×4): 0.5 mg via INTRAVENOUS

## 2013-12-05 MED ORDER — METOCLOPRAMIDE HCL 5 MG/ML IJ SOLN: 10.0000 mg | Freq: Once | INTRAMUSCULAR | Status: DC | PRN

## 2013-12-05 MED ORDER — 0.9 % SODIUM CHLORIDE (POUR BTL) OPTIME
TOPICAL | Status: DC | PRN
  Administered 2013-12-05: 1000 mL

## 2013-12-05 MED ORDER — METHOCARBAMOL 100 MG/ML IJ SOLN
500.0000 mg | Freq: Four times a day (QID) | INTRAVENOUS | Status: DC | PRN
  Filled 2013-12-05: qty 5

## 2013-12-05 MED ORDER — PANTOPRAZOLE SODIUM 40 MG PO TBEC
40.0000 mg | DELAYED_RELEASE_TABLET | Freq: Every day | ORAL | Status: DC
  Administered 2013-12-06 – 2013-12-10 (×4): 40 mg via ORAL
  Filled 2013-12-05 (×4): qty 1

## 2013-12-05 MED ORDER — DIAZEPAM 5 MG/ML IJ SOLN
5.0000 mg | Freq: Once | INTRAMUSCULAR | Status: AC
  Administered 2013-12-05: 5 mg via INTRAVENOUS

## 2013-12-05 MED ORDER — ONDANSETRON HCL 4 MG/2ML IJ SOLN: 4.0000 mg | Freq: Four times a day (QID) | INTRAMUSCULAR | Status: DC | PRN

## 2013-12-05 MED ORDER — MORPHINE SULFATE 2 MG/ML IJ SOLN
1.0000 mg | INTRAMUSCULAR | Status: DC | PRN
Start: 2013-12-05 — End: 2013-12-10
  Administered 2013-12-05: 1 mg via INTRAVENOUS
  Filled 2013-12-05: qty 1

## 2013-12-05 MED ORDER — OXYCODONE HCL 5 MG PO TABS
10.0000 mg | ORAL_TABLET | Freq: Once | ORAL | Status: AC
  Administered 2013-12-05: 10 mg via ORAL

## 2013-12-05 MED ORDER — ASPIRIN EC 325 MG PO TBEC
325.0000 mg | DELAYED_RELEASE_TABLET | Freq: Two times a day (BID) | ORAL | Status: DC
  Administered 2013-12-06 – 2013-12-10 (×8): 325 mg via ORAL
  Filled 2013-12-05 (×11): qty 1

## 2013-12-05 MED ORDER — METHOCARBAMOL 500 MG PO TABS
500.0000 mg | ORAL_TABLET | Freq: Four times a day (QID) | ORAL | Status: DC | PRN
  Administered 2013-12-05 – 2013-12-10 (×9): 500 mg via ORAL
  Filled 2013-12-05 (×9): qty 1

## 2013-12-05 MED ORDER — METHOCARBAMOL 500 MG PO TABS
ORAL_TABLET | ORAL | Status: AC
  Administered 2013-12-06: 500 mg
  Filled 2013-12-05: qty 1

## 2013-12-05 MED ORDER — VANCOMYCIN HCL IN DEXTROSE 1-5 GM/200ML-% IV SOLN
INTRAVENOUS | Status: AC
  Administered 2013-12-05: 1000 mg via INTRAVENOUS
  Filled 2013-12-05: qty 200

## 2013-12-05 MED ORDER — HYDROMORPHONE HCL PF 1 MG/ML IJ SOLN
0.2500 mg | INTRAMUSCULAR | Status: DC | PRN
  Administered 2013-12-05 (×3): 0.5 mg via INTRAVENOUS

## 2013-12-05 MED ORDER — ZOLPIDEM TARTRATE 5 MG PO TABS
5.0000 mg | ORAL_TABLET | Freq: Every evening | ORAL | Status: DC | PRN
  Administered 2013-12-06 – 2013-12-09 (×3): 5 mg via ORAL
  Filled 2013-12-05 (×3): qty 1

## 2013-12-05 MED ORDER — LACTATED RINGERS IV SOLN
INTRAVENOUS | Status: DC
  Administered 2013-12-05: 15:00:00 via INTRAVENOUS

## 2013-12-05 MED ORDER — LISINOPRIL 5 MG PO TABS
5.0000 mg | ORAL_TABLET | Freq: Every morning | ORAL | Status: DC
  Administered 2013-12-06 – 2013-12-10 (×5): 5 mg via ORAL
  Filled 2013-12-05 (×5): qty 1

## 2013-12-05 MED ORDER — OXYCODONE HCL ER 20 MG PO T12A
20.0000 mg | EXTENDED_RELEASE_TABLET | Freq: Two times a day (BID) | ORAL | Status: DC
  Administered 2013-12-05 – 2013-12-07 (×5): 20 mg via ORAL
  Filled 2013-12-05 (×5): qty 2

## 2013-12-05 MED ORDER — OXYCODONE HCL 5 MG PO TABS: 5.0000 mg | ORAL_TABLET | Freq: Once | ORAL | Status: DC | PRN

## 2013-12-05 MED ORDER — OXYCODONE HCL 5 MG PO TABS
5.0000 mg | ORAL_TABLET | ORAL | Status: DC | PRN
  Administered 2013-12-05 – 2013-12-08 (×15): 10 mg via ORAL
  Administered 2013-12-08: 5 mg via ORAL
  Administered 2013-12-09 – 2013-12-10 (×6): 10 mg via ORAL
  Filled 2013-12-05 (×5): qty 2
  Filled 2013-12-05: qty 1
  Filled 2013-12-05 (×14): qty 2

## 2013-12-05 MED ORDER — AMLODIPINE BESYLATE 5 MG PO TABS
5.0000 mg | ORAL_TABLET | Freq: Every day | ORAL | Status: DC
  Administered 2013-12-06 – 2013-12-10 (×5): 5 mg via ORAL
  Filled 2013-12-05 (×6): qty 1

## 2013-12-05 MED ORDER — ADULT MULTIVITAMIN W/MINERALS CH
1.0000 | ORAL_TABLET | Freq: Every day | ORAL | Status: DC
  Administered 2013-12-06 – 2013-12-10 (×5): 1 via ORAL
  Filled 2013-12-05 (×6): qty 1

## 2013-12-05 MED ORDER — VANCOMYCIN HCL IN DEXTROSE 1-5 GM/200ML-% IV SOLN
1000.0000 mg | Freq: Once | INTRAVENOUS | Status: DC
  Filled 2013-12-05: qty 200

## 2013-12-05 MED ORDER — LIDOCAINE HCL (CARDIAC) 20 MG/ML IV SOLN
INTRAVENOUS | Status: DC | PRN
  Administered 2013-12-05: 50 mg via INTRAVENOUS

## 2013-12-05 MED ORDER — METOCLOPRAMIDE HCL 10 MG PO TABS: 5.0000 mg | ORAL_TABLET | Freq: Three times a day (TID) | ORAL | Status: DC | PRN

## 2013-12-05 MED ORDER — METOCLOPRAMIDE HCL 5 MG/ML IJ SOLN: 5.0000 mg | Freq: Three times a day (TID) | INTRAMUSCULAR | Status: DC | PRN

## 2013-12-05 MED ORDER — OXYCODONE HCL 5 MG PO TABS
ORAL_TABLET | ORAL | Status: AC
  Filled 2013-12-05: qty 2

## 2013-12-05 MED ORDER — HYDROCODONE-ACETAMINOPHEN 5-325 MG PO TABS
1.0000 | ORAL_TABLET | ORAL | Status: DC | PRN
  Administered 2013-12-09 – 2013-12-10 (×2): 2 via ORAL
  Filled 2013-12-05 (×2): qty 2

## 2013-12-05 MED ORDER — SUFENTANIL CITRATE 50 MCG/ML IV SOLN
INTRAVENOUS | Status: DC | PRN
  Administered 2013-12-05 (×2): 10 ug via INTRAVENOUS
  Administered 2013-12-05: 5 ug via INTRAVENOUS
  Administered 2013-12-05: 10 ug via INTRAVENOUS

## 2013-12-05 MED ORDER — SODIUM CHLORIDE 0.9 % IV SOLN
INTRAVENOUS | Status: DC
  Administered 2013-12-05: 75 mL/h via INTRAVENOUS
  Administered 2013-12-06 – 2013-12-08 (×3): via INTRAVENOUS

## 2013-12-05 MED ORDER — PIPERACILLIN-TAZOBACTAM 3.375 G IVPB
3.3750 g | Freq: Three times a day (TID) | INTRAVENOUS | Status: DC
  Administered 2013-12-05 – 2013-12-07 (×6): 3.375 g via INTRAVENOUS
  Filled 2013-12-05 (×8): qty 50

## 2013-12-05 MED ORDER — VANCOMYCIN HCL IN DEXTROSE 1-5 GM/200ML-% IV SOLN
1000.0000 mg | Freq: Two times a day (BID) | INTRAVENOUS | Status: DC
  Administered 2013-12-06 – 2013-12-08 (×5): 1000 mg via INTRAVENOUS
  Filled 2013-12-05 (×6): qty 200

## 2013-12-05 MED ORDER — ONDANSETRON HCL 4 MG PO TABS: 4.0000 mg | ORAL_TABLET | Freq: Four times a day (QID) | ORAL | Status: DC | PRN

## 2013-12-05 MED ORDER — PROPOFOL 10 MG/ML IV BOLUS
INTRAVENOUS | Status: DC | PRN
  Administered 2013-12-05: 200 mg via INTRAVENOUS

## 2013-12-05 SURGICAL SUPPLY — 66 items
BANDAGE CONFORM 3  STR LF (GAUZE/BANDAGES/DRESSINGS) IMPLANT
BANDAGE ELASTIC 3 VELCRO ST LF (GAUZE/BANDAGES/DRESSINGS) IMPLANT
BANDAGE ELASTIC 6 VELCRO ST LF (GAUZE/BANDAGES/DRESSINGS) ×4 IMPLANT
BLADE SURG 10 STRL SS (BLADE) IMPLANT
BNDG COHESIVE 1X5 TAN STRL LF (GAUZE/BANDAGES/DRESSINGS) IMPLANT
BNDG COHESIVE 4X5 TAN STRL (GAUZE/BANDAGES/DRESSINGS) ×2 IMPLANT
BNDG COHESIVE 6X5 TAN STRL LF (GAUZE/BANDAGES/DRESSINGS) IMPLANT
BNDG GAUZE STRTCH 6 (GAUZE/BANDAGES/DRESSINGS) IMPLANT
CLOTH BEACON ORANGE TIMEOUT ST (SAFETY) ×2 IMPLANT
COLLECTOR WOUND DRAINAGE MED (WOUND CARE) ×4 IMPLANT
CORDS BIPOLAR (ELECTRODE) IMPLANT
COVER SURGICAL LIGHT HANDLE (MISCELLANEOUS) ×2 IMPLANT
CUFF TOURNIQUET SINGLE 18IN (TOURNIQUET CUFF) IMPLANT
CUFF TOURNIQUET SINGLE 24IN (TOURNIQUET CUFF) IMPLANT
CUFF TOURNIQUET SINGLE 34IN LL (TOURNIQUET CUFF) ×2 IMPLANT
CUFF TOURNIQUET SINGLE 44IN (TOURNIQUET CUFF) IMPLANT
DRAPE ORTHO SPLIT 77X108 STRL (DRAPES)
DRAPE SURG 17X23 STRL (DRAPES) IMPLANT
DRAPE SURG ORHT 6 SPLT 77X108 (DRAPES) IMPLANT
DRAPE U-SHAPE 47X51 STRL (DRAPES) ×2 IMPLANT
DRSG PAD ABDOMINAL 8X10 ST (GAUZE/BANDAGES/DRESSINGS) ×4 IMPLANT
DURAPREP 26ML APPLICATOR (WOUND CARE) ×2 IMPLANT
ELECT CAUTERY BLADE 6.4 (BLADE) ×2 IMPLANT
ELECT REM PT RETURN 9FT ADLT (ELECTROSURGICAL) ×2
ELECTRODE REM PT RTRN 9FT ADLT (ELECTROSURGICAL) ×1 IMPLANT
GAUZE XEROFORM 1X8 LF (GAUZE/BANDAGES/DRESSINGS) IMPLANT
GAUZE XEROFORM 5X9 LF (GAUZE/BANDAGES/DRESSINGS) ×2 IMPLANT
GLOVE BIO SURGEON STRL SZ8 (GLOVE) ×2 IMPLANT
GLOVE BIOGEL PI IND STRL 7.0 (GLOVE) ×2 IMPLANT
GLOVE BIOGEL PI IND STRL 8 (GLOVE) ×2 IMPLANT
GLOVE BIOGEL PI INDICATOR 7.0 (GLOVE) ×2
GLOVE BIOGEL PI INDICATOR 8 (GLOVE) ×2
GLOVE ORTHO TXT STRL SZ7.5 (GLOVE) ×2 IMPLANT
GOWN PREVENTION PLUS LG XLONG (DISPOSABLE) IMPLANT
GOWN PREVENTION PLUS XLARGE (GOWN DISPOSABLE) ×4 IMPLANT
GOWN STRL NON-REIN LRG LVL3 (GOWN DISPOSABLE) IMPLANT
HANDPIECE INTERPULSE COAX TIP (DISPOSABLE) ×1
KIT BASIN OR (CUSTOM PROCEDURE TRAY) ×2 IMPLANT
KIT ROOM TURNOVER OR (KITS) ×2 IMPLANT
MANIFOLD NEPTUNE II (INSTRUMENTS) ×2 IMPLANT
NEEDLE 18GX1X1/2 (RX/OR ONLY) (NEEDLE) ×2 IMPLANT
NS IRRIG 1000ML POUR BTL (IV SOLUTION) ×2 IMPLANT
PACK ORTHO EXTREMITY (CUSTOM PROCEDURE TRAY) ×2 IMPLANT
PAD ARMBOARD 7.5X6 YLW CONV (MISCELLANEOUS) ×4 IMPLANT
PADDING CAST ABS 4INX4YD NS (CAST SUPPLIES)
PADDING CAST ABS COTTON 4X4 ST (CAST SUPPLIES) IMPLANT
PADDING CAST COTTON 6X4 STRL (CAST SUPPLIES) ×4 IMPLANT
SET HNDPC FAN SPRY TIP SCT (DISPOSABLE) ×1 IMPLANT
SPONGE GAUZE 4X4 12PLY (GAUZE/BANDAGES/DRESSINGS) ×2 IMPLANT
SPONGE LAP 18X18 X RAY DECT (DISPOSABLE) ×2 IMPLANT
STOCKINETTE IMPERVIOUS 9X36 MD (GAUZE/BANDAGES/DRESSINGS) ×2 IMPLANT
SUT ETHILON 2 0 FS 18 (SUTURE) ×12 IMPLANT
SUT ETHILON 3 0 PS 1 (SUTURE) IMPLANT
SUT VIC AB 0 CT1 27 (SUTURE) ×3
SUT VIC AB 0 CT1 27XBRD ANBCTR (SUTURE) ×3 IMPLANT
SUT VIC AB 2-0 CT1 27 (SUTURE) ×2
SUT VIC AB 2-0 CT1 TAPERPNT 27 (SUTURE) ×2 IMPLANT
SYR CONTROL 10ML LL (SYRINGE) ×2 IMPLANT
TOWEL OR 17X24 6PK STRL BLUE (TOWEL DISPOSABLE) ×2 IMPLANT
TOWEL OR 17X26 10 PK STRL BLUE (TOWEL DISPOSABLE) ×2 IMPLANT
TUBE ANAEROBIC SPECIMEN COL (MISCELLANEOUS) ×2 IMPLANT
TUBE CONNECTING 12X1/4 (SUCTIONS) ×2 IMPLANT
TUBE FEEDING 5FR 15 INCH (TUBING) IMPLANT
UNDERPAD 30X30 INCONTINENT (UNDERPADS AND DIAPERS) ×2 IMPLANT
WATER STERILE IRR 1000ML POUR (IV SOLUTION) IMPLANT
YANKAUER SUCT BULB TIP NO VENT (SUCTIONS) ×2 IMPLANT

## 2013-12-05 NOTE — Brief Op Note (Signed)
12/05/2013  5:26 PM  PATIENT:  Roger Ayers  47 y.o. male  PRE-OPERATIVE DIAGNOSIS:  Post op infection s/p ORIF Right tibial plateau  POST-OPERATIVE DIAGNOSIS:  Post op infection s/p ORIF Right tibial plateau  PROCEDURE:  Procedure(s): IRRIGATION AND DEBRIDEMENT RIGHT LEG (Right)  SURGEON:  Surgeon(s) and Role:    * Kathryne Hitch, MD - Primary  PHYSICIAN ASSISTANT: Rexene Edison, PA-C  ANESTHESIA:   general  EBL:  Total I/O In: 500 [I.V.:500] Out: -   BLOOD ADMINISTERED:none  DRAINS: (medium) Hemovact drain(s) in the leg with  Suction Open   LOCAL MEDICATIONS USED:  NONE  SPECIMEN:  No Specimen  DISPOSITION OF SPECIMEN:  N/A  COUNTS:  YES  TOURNIQUET:    DICTATION: .Other Dictation: Dictation Number [id number]  PLAN OF CARE: Admit to inpatient   PATIENT DISPOSITION:  PACU - hemodynamically stable.   Delay start of Pharmacological VTE agent (>24hrs) due to surgical blood loss or risk of bleeding: no

## 2013-12-05 NOTE — Progress Notes (Signed)
Pt c/o pain rates as a 7 and climbing.  Dr Gelene Mink called and new orders noted.

## 2013-12-05 NOTE — Transfer of Care (Signed)
Immediate Anesthesia Transfer of Care Note  Patient: Roger Ayers  Procedure(s) Performed: Procedure(s): IRRIGATION AND DEBRIDEMENT RIGHT LEG (Right)  Patient Location: PACU  Anesthesia Type:General  Level of Consciousness: awake and alert   Airway & Oxygen Therapy: Patient Spontanous Breathing and Patient connected to nasal cannula oxygen  Post-op Assessment: Report given to PACU RN, Post -op Vital signs reviewed and stable and Patient moving all extremities  Post vital signs: Reviewed and stable  Complications: No apparent anesthesia complications

## 2013-12-05 NOTE — Preoperative (Signed)
Beta Blockers   Reason not to administer Beta Blockers:Not Applicable 

## 2013-12-05 NOTE — Progress Notes (Signed)
Pt states that he ate breakfast this am.  Had bowl of cereal and milk, along with coffee and water with his meds at 0800.

## 2013-12-05 NOTE — H&P (Signed)
Roger Ayers is an 47 y.o. male.   Chief Complaint:   Right knee drainage, increased pain, and redness HPI:   47 yo male well-known to me who is almost 2 weeks post ORIF and bone grafting of a severely comminuted right bicondylar tibial plateau fracture.  His original injury occurred about 3+ weeks ago when he fell about 10 feet off of a ladder sustaining a severely comminuted right tin=bial plateau fracture.  He first underwent external fixation to improve his alignement and to temporarily stabilize the fracture to allow for swelling to subside.  After the soft tissue swelling decreased significantly, he was taken to the OR about 6 days later for definitive treatment.  He underwent removal of his external fixation, plating of the fracture, and bone grafting.  He was then discharge to home 2-3 days later.  He then presented to the office today with a one day history of drainage, fever, redness, and increased pain.  He understand that we need to proceed to the OR today for an I&D and gives informed consent.  Past Medical History  Diagnosis Date  . Hypertension   . GERD (gastroesophageal reflux disease)     Past Surgical History  Procedure Laterality Date  . Mouth surgery    . Trigger finger release    . External fixation leg Right 11/17/2013    Procedure: EXTERNAL FIXATION LEG;  Surgeon: Cheral Almas, MD;  Location: [facility];  Service: Orthopedics;  Laterality: Right;  . Orif tibia plateau Right 11/23/2013    Procedure: OPEN REDUCTION INTERNAL FIXATION (ORIF) RIGHT TIBIAL PLATEAU;  Surgeon: Kathryne Hitch, MD;  Location: [facility];  Service: Orthopedics;  Laterality: Right;  . External fixation removal Right 11/23/2013    Procedure: REMOVAL EXTERNAL FIXATION RIGHT LEG;  Surgeon: Kathryne Hitch, MD;  Location: [facility];  Service: Orthopedics;  Laterality: Right;    History reviewed. No pertinent family history. Social History:  reports that he has been smoking Cigarettes.  He  has been smoking about 0.25 packs per day. He does not have any smokeless tobacco history on file. He reports that he drinks about 4.2 ounces of alcohol per week. He reports that he does not use illicit drugs.  Allergies:  Allergies  Allergen Reactions  . Thimerosal     Back when he was in high school (dicontinued med)    Medications Prior to Admission  Medication Sig Dispense Refill  . acetaminophen (TYLENOL) 500 MG tablet Take 500 mg by mouth every 6 (six) hours as needed.      Marland Kitchen amLODipine (NORVASC) 5 MG tablet Take 5 mg by mouth daily.      Marland Kitchen aspirin EC 325 MG EC tablet Take 1 tablet (325 mg total) by mouth 2 (two) times daily after a meal.  60 tablet  0  . lisinopril (PRINIVIL,ZESTRIL) 5 MG tablet Take 5 mg by mouth every morning.      . methocarbamol (ROBAXIN) 500 MG tablet Take 1 tablet (500 mg total) by mouth every 6 (six) hours as needed for muscle spasms.  60 tablet  0  . Multiple Vitamin (MULTIVITAMIN WITH MINERALS) TABS tablet Take 1 tablet by mouth daily.      Marland Kitchen omeprazole (PRILOSEC) 20 MG capsule Take 20 mg by mouth every morning.      Marland Kitchen oxyCODONE (OXY IR/ROXICODONE) 5 MG immediate release tablet Take 5-10 mg by mouth every 4 (four) hours as needed for moderate pain or severe pain.  Results for orders placed during the hospital encounter of 12/05/13 (from the past 48 hour(s))  BASIC METABOLIC PANEL     Status: Abnormal   Collection Time    12/05/13  1:05 PM      Result Value Range   Sodium 126 (*) 135 - 145 mEq/L   Potassium 4.4  3.5 - 5.1 mEq/L   Chloride 87 (*) 96 - 112 mEq/L   CO2 25  19 - 32 mEq/L   Glucose, Bld 112 (*) 70 - 99 mg/dL   BUN 21  6 - 23 mg/dL   Creatinine, Ser 1.61  0.50 - 1.35 mg/dL   Calcium 8.6  8.4 - 09.6 mg/dL   GFR calc non Af Amer 79 (*) >90 mL/min   GFR calc Af Amer >90  >90 mL/min   Comment: (NOTE)     The eGFR has been calculated using the CKD EPI equation.     This calculation has not been validated in all clinical situations.      eGFR's persistently <90 mL/min signify possible Chronic Kidney     Disease.  CBC     Status: Abnormal   Collection Time    12/05/13  1:05 PM      Result Value Range   WBC 24.0 (*) 4.0 - 10.5 K/uL   RBC 3.62 (*) 4.22 - 5.81 MIL/uL   Hemoglobin 11.6 (*) 13.0 - 17.0 g/dL   HCT 04.5 (*) 40.9 - 81.1 %   MCV 95.3  78.0 - 100.0 fL   MCH 32.0  26.0 - 34.0 pg   MCHC 33.6  30.0 - 36.0 g/dL   RDW 91.4  78.2 - 95.6 %   Platelets 433 (*) 150 - 400 K/uL   Dg Chest 2 View  12/05/2013   CLINICAL DATA:  Preoperative exam prior to lower extremity incision and drainage. Fever, smoker. Hypertension history.  EXAM: CHEST  2 VIEW  COMPARISON:  11/17/2013  FINDINGS: The heart size and mediastinal contours are within normal limits. Both lungs are clear. The visualized skeletal structures are unremarkable.  IMPRESSION: No active cardiopulmonary disease.   Electronically Signed   By: Christiana Pellant M.D.   On: 12/05/2013 15:18    Review of Systems  Constitutional: Positive for fever.  Musculoskeletal: Positive for joint pain.  All other systems reviewed and are negative.    Blood pressure 112/70, pulse 107, temperature 99.9 F (37.7 C), temperature source Oral, resp. rate 16, SpO2 96.00%. Physical Exam  Constitutional: He is oriented to person, place, and time. He appears well-developed and well-nourished.  HENT:  Head: Normocephalic and atraumatic.  Eyes: EOM are normal. Pupils are equal, round, and reactive to light.  Neck: Normal range of motion. Neck supple.  Cardiovascular: Normal rate and regular rhythm.   Respiratory: Effort normal and breath sounds normal.  GI: Soft. Bowel sounds are normal.  Musculoskeletal:       Legs: Neurological: He is alert and oriented to person, place, and time.  Skin: Skin is warm and dry.  Psychiatric: He has a normal mood and affect.     Assessment/Plan Post-op infection right proximal tibia 2 weeks post-ORIF tibial plateau fracture 1)  I spoke to him in  length and he understands that we will proceed to the OR today for an urgent I&D with the hopes of retaining his hardware long enough to get bone healing.  He will be admitted post-op for IV antibiotics and likely PICC-line placement.    Kathryne Hitch 12/05/2013, 3:39  PM

## 2013-12-05 NOTE — Anesthesia Procedure Notes (Signed)
Procedure Name: LMA Insertion Date/Time: 12/05/2013 4:00 PM Performed by: Charm Barges, Lilymarie Scroggins R Pre-anesthesia Checklist: Patient identified, Emergency Drugs available, Suction available, Patient being monitored and Timeout performed Patient Re-evaluated:Patient Re-evaluated prior to inductionOxygen Delivery Method: Circle system utilized Preoxygenation: Pre-oxygenation with 100% oxygen Intubation Type: IV induction Ventilation: Mask ventilation without difficulty LMA: LMA inserted LMA Size: 5.0 Number of attempts: 1 Placement Confirmation: positive ETCO2 Tube secured with: Tape Dental Injury: Teeth and Oropharynx as per pre-operative assessment

## 2013-12-05 NOTE — Progress Notes (Signed)
ANTIBIOTIC CONSULT NOTE - INITIAL  Pharmacy Consult for vancomycin and zosyn Indication: right proximal tibia  Allergies  Allergen Reactions  . Thimerosal     Back when he was in high school (dicontinued med)    Patient Measurements:   Adjusted Body Weight:   Vital Signs: Temp: 99.2 F (37.3 C) (12/10 2004) Temp src: Oral (12/10 2004) BP: 130/68 mmHg (12/10 2004) Pulse Rate: 103 (12/10 2004) Intake/Output from previous day:   Intake/Output from this shift:    Labs:  Recent Labs  12/05/13 1305  WBC 24.0*  HGB 11.6*  PLT 433*  CREATININE 1.09   The CrCl is unknown because both a height and weight (above a minimum accepted value) are required for this calculation. No results found for this basename: VANCOTROUGH, Leodis Binet, VANCORANDOM, GENTTROUGH, GENTPEAK, GENTRANDOM, TOBRATROUGH, TOBRAPEAK, TOBRARND, AMIKACINPEAK, AMIKACINTROU, AMIKACIN,  in the last 72 hours   Microbiology: Recent Results (from the past 720 hour(s))  SURGICAL PCR SCREEN     Status: Abnormal   Collection Time    11/22/13  4:18 PM      Result Value Range Status   MRSA, PCR NEGATIVE  NEGATIVE Final   Staphylococcus aureus POSITIVE (*) NEGATIVE Final   Comment:            The Xpert SA Assay (FDA     approved for NASAL specimens     in patients over 10 years of age),     is one component of     a comprehensive surveillance     program.  Test performance has     been validated by [employer] for patients greater     than or equal to 25 year old.     It is not intended     to diagnose infection nor to     guide or monitor treatment.    Medical History: Past Medical History  Diagnosis Date  . Hypertension   . GERD (gastroesophageal reflux disease)     Medications:  Scheduled:  . amLODipine  5 mg Oral Daily  . [START ON 12/06/2013] aspirin EC  325 mg Oral BID PC  . diazepam      . HYDROmorphone      . HYDROmorphone      . HYDROmorphone      . HYDROmorphone      . [START ON  12/06/2013] lisinopril  5 mg Oral q morning - 10a  . methocarbamol      . multivitamin with minerals  1 tablet Oral Daily  . oxyCODONE      . oxyCODONE      . OxyCODONE  20 mg Oral Q12H  . pantoprazole  40 mg Oral Daily   Infusions:  . sodium chloride     Assessment: 47 yo male with right proximal tibia infection will be started on vancomycin and zosyn.  Patient had 1g of vancomycin at 1621 on 12/05/13.  SCr is 1.09 (CrCl ~88.9)  Goal of Therapy:  Vancomycin trough level 15-20 mcg/ml for now  Plan:  1) Vancomycin 1g iv q12h, 1st dose at 0400 on 12/06/13 2) Zosyn 3.375g iv q8h (4h infusion) 3) Follow plan on antibiotic before check vancomycin trough 4) Monitor renal function and culture if any  Jesi Jurgens, Tsz-Yin 12/05/2013,8:15 PM

## 2013-12-05 NOTE — Progress Notes (Signed)
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

## 2013-12-05 NOTE — Anesthesia Preprocedure Evaluation (Addendum)
Anesthesia Evaluation  Patient identified by MRN, date of birth, ID band Patient awake    Reviewed: Allergy & Precautions, H&P , NPO status , Patient's Chart, lab work & pertinent test results, reviewed documented beta blocker date and time   Airway Mallampati: II TM Distance: >3 FB Neck ROM: full    Dental   Pulmonary COPDCurrent Smoker,  + rhonchi         Cardiovascular hypertension, On Medications Rhythm:regular Rate:Normal     Neuro/Psych negative neurological ROS  negative psych ROS   GI/Hepatic Neg liver ROS, GERD-  Medicated and Controlled,  Endo/Other  negative endocrine ROS  Renal/GU negative Renal ROS  negative genitourinary   Musculoskeletal   Abdominal (+) + obese,   Peds  Hematology negative hematology ROS (+)   Anesthesia Other Findings See surgeon's H&P   Reproductive/Obstetrics negative OB ROS                          Anesthesia Physical Anesthesia Plan  ASA: II  Anesthesia Plan: General   Post-op Pain Management:    Induction: Intravenous  Airway Management Planned: LMA  Additional Equipment:   Intra-op Plan:   Post-operative Plan: Extubation in OR  Informed Consent: I have reviewed the patients History and Physical, chart, labs and discussed the procedure including the risks, benefits and alternatives for the proposed anesthesia with the patient or authorized representative who has indicated his/her understanding and acceptance.   Dental Advisory Given  Plan Discussed with: CRNA and Surgeon  Anesthesia Plan Comments:        Anesthesia Quick Evaluation

## 2013-12-06 ENCOUNTER — Encounter (HOSPITAL_COMMUNITY): Payer: Self-pay | Admitting: Orthopaedic Surgery

## 2013-12-06 ENCOUNTER — Inpatient Hospital Stay (HOSPITAL_COMMUNITY): Payer: BC Managed Care – PPO

## 2013-12-06 LAB — CBC
MCHC: 33.4 g/dL (ref 30.0–36.0)
Platelets: 398 10*3/uL (ref 150–400)
RDW: 12.9 % (ref 11.5–15.5)
WBC: 18.1 10*3/uL — ABNORMAL HIGH (ref 4.0–10.5)

## 2013-12-06 LAB — BASIC METABOLIC PANEL
CO2: 27 mEq/L (ref 19–32)
Calcium: 7.9 mg/dL — ABNORMAL LOW (ref 8.4–10.5)
Chloride: 94 mEq/L — ABNORMAL LOW (ref 96–112)
Creatinine, Ser: 1 mg/dL (ref 0.50–1.35)
GFR calc Af Amer: >90 mL/min (ref >90)
Sodium: 128 mEq/L — ABNORMAL LOW (ref 135–145)

## 2013-12-06 LAB — C-REACTIVE PROTEIN: CRP: 44.3 mg/dL — ABNORMAL HIGH (ref <0.60)

## 2013-12-06 LAB — SEDIMENTATION RATE: Sed Rate: 104 mm/hr — ABNORMAL HIGH (ref 0–16)

## 2013-12-06 MED ORDER — INFLUENZA VAC SPLIT QUAD 0.5 ML IM SUSP: 0.5000 mL | INTRAMUSCULAR | Status: AC | PRN

## 2013-12-06 MED ORDER — PNEUMOCOCCAL VAC POLYVALENT 25 MCG/0.5ML IJ INJ: 0.5000 mL | INJECTION | INTRAMUSCULAR | Status: AC | PRN

## 2013-12-06 NOTE — Progress Notes (Signed)
Subjective: 1 Day Post-Op Procedure(s) (LRB): IRRIGATION AND DEBRIDEMENT RIGHT LEG (Right) Patient reports pain as moderate.  Gram stain showing gram + cocci.   WBC down to 18,000.  Objective: Vital signs in last 24 hours: Temp:  [98.7 F (37.1 C)-100.8 F (38.2 C)] 98.8 F (37.1 C) (12/11 0523) Pulse Rate:  [89-107] 89 (12/11 0523) Resp:  [9-16] 16 (12/11 0523) BP: (112-148)/(52-95) 123/58 mmHg (12/11 0523) SpO2:  [94 %-100 %] 98 % (12/11 0523)  Intake/Output from previous day: 12/10 0701 - 12/11 0700 In: 1370 [P.O.:720; I.V.:650] Out: 1085 [Urine:1050; Drains:35] Intake/Output this shift: Total I/O In: -  Out: 500 [Urine:500]   Recent Labs  12/05/13 1305 12/06/13 0452  HGB 11.6* 10.3*    Recent Labs  12/05/13 1305 12/06/13 0452  WBC 24.0* 18.1*  RBC 3.62* 3.24*  HCT 34.5* 30.8*  PLT 433* 398    Recent Labs  12/05/13 1305 12/06/13 0452  NA 126* 128*  K 4.4 4.0  CL 87* 94*  CO2 25 27  BUN 21 18  CREATININE 1.09 1.00  GLUCOSE 112* 126*  CALCIUM 8.6 7.9*   No results found for this basename: LABPT, INR,  in the last 72 hours  Sensation intact distally Intact pulses distally Dorsiflexion/Plantar flexion intact Incision: dressing C/D/I Compartment soft  Assessment/Plan: 1 Day Post-Op Procedure(s) (LRB): IRRIGATION AND DEBRIDEMENT RIGHT LEG (Right) Continue ABX therapy due to Post-op infection PICC-line today Continue drains today.  Camyah Pultz Y 12/06/2013, 9:48 AM

## 2013-12-06 NOTE — Progress Notes (Signed)
Orthopedic Tech Progress Note Patient Details:  Roger Ayers 09-19-1966 [id number] OHF applied to bed Patient ID: Roger Ayers, male   DOB: [birth year], 47 y.o.   MRN: [id number]   [name] 12/06/2013, 1:39 PM

## 2013-12-06 NOTE — Progress Notes (Signed)
Orthopedic Tech Progress Note Patient Details:  Roger Ayers [birth year] [id number] Completed by bio-tech Patient ID: Roger Ayers, male   DOB: 1966/01/12, 47 y.o.   MRN: [id number]   Roger Ayers 12/06/2013, 3:44 PM

## 2013-12-06 NOTE — Op Note (Signed)
NAMEELIOR, ROBINETTE             ACCOUNT NO.:  [id number]  MEDICAL RECORD NO.:  [id number]  LOCATION:  [id number]                        FACILITY:  [facility]  PHYSICIAN:  Vanita Panda. Magnus Ivan, M.D.DATE OF BIRTH:  10-07-1966  DATE OF PROCEDURE:  12/05/2013 DATE OF DISCHARGE:                              OPERATIVE REPORT   PREOPERATIVE DIAGNOSIS:  Right proximal tibia wound postop infection.  POSTOPERATIVE DIAGNOSIS:  Right proximal tibia wound postop infection.  PROCEDURE:  Irrigation and debridement of right proximal tibia lateral wound incision.  FINDINGS:  Gross purulence in the superficial and deep tissue with intact hardware.  Gram stain and cultures pending.  SURGEON:  Vanita Panda. Magnus Ivan, M.D.  ASSISTANT:  Richardean Canal, PA-C.  ANESTHESIA:  General.  BLOOD LOSS:  Less than 100 mL.  COMPLICATIONS:  None.  INDICATIONS:  Mr. Mazzoni is a 47 year old gentleman well known to me. About 3+ weeks ago, he sustained a mechanical fall from a ladder about 10 feet in the air landing on his right knee.  He sustained a bicondylar tibial plateau fracture with severe comminution of the lateral tibial plateau.  That evening, he was placed in external fixation to stabilize the fracture temporarily and to allow for soft tissue swelling to decrease.  We then took him to the operating room about 6 days later for removal of internal fixation and definitive plating of the fracture. This was quite a difficult fracture plate and we were able to get into the joint, elevate the joint line, and pack it with bone graft and then successfully plated.  He then was on intraoperative and postoperative antibiotics for 24 hours and convalesced at the hospital for few days before going home.  Over the last 24 hours, he developed increasing pain, redness, and drainage from his wound.  I saw him in the office today and noted drainage from the wound and was concerning for deep infection with some  associated cellulitis and pain.  We decided to proceed with urgent surgery.  He understands in detail the risks and benefits of the surgery.  PROCEDURE DESCRIPTION:  After informed consent was obtained, appropriate right leg was marked.  He was brought to the operating room, placed supine on the operative table.  General anesthesia was then obtained.  A nonsterile tourniquet was placed around his upper right thigh and his right leg from the thigh down to the ankle was prepped and draped with Betadine scrub and paint.  Time-out was called and he was identified as correct patient, correct right knee.  We did not use the elevated tourniquet at all and I removed all of the sutures on the lateral wound and found gross purulent drainage, which we sent cultures off.  Once cultures were sent, he got a g of IV vancomycin.  We then first used pulsatile lavage in the superficial tissues using normal saline solution, and then went to the deep tissues around the plate with additional pulsatile lavage.  We lavaged a total of 5 L through the wound.  I then curetted out the external fixation pin sites at distal chin, but did not need to curette around the plate at all.  I then placed 1 deep medium Hemovac and  1 superficial medium Hemovac and closed the deep tissue with 0-Vicryl followed by 2-0 Vicryl in the subcutaneous tissue and interrupted nylon in the skin.  Xeroform well-padded sterile dressing was applied.  He was awakened, extubated, taken to the recovery room in stable condition.  All final counts were correct and there were no complications noted.  Postoperatively, we will have him on IV antibiotics and likely place a PICC line pending what the culture findings are.  We will likely get Infectious Disease involved as well.     Vanita Panda. Magnus Ivan, M.D.     CYB/MEDQ  D:  12/05/2013  T:  12/06/2013  Job:  [id number]

## 2013-12-06 NOTE — Anesthesia Postprocedure Evaluation (Signed)
  Anesthesia Post-op Note  Patient: Roger Ayers  Procedure(s) Performed: Procedure(s): IRRIGATION AND DEBRIDEMENT RIGHT LEG (Right)  Patient Location: PACU  Anesthesia Type:General  Level of Consciousness: awake  Airway and Oxygen Therapy: Patient Spontanous Breathing  Post-op Pain: mild  Post-op Assessment: Post-op Vital signs reviewed, Patient's Cardiovascular Status Stable, Respiratory Function Stable, Patent Airway, No signs of Nausea or vomiting and Pain level controlled  Post-op Vital Signs: Reviewed and stable  Complications: No apparent anesthesia complications

## 2013-12-07 ENCOUNTER — Inpatient Hospital Stay (HOSPITAL_COMMUNITY): Payer: BC Managed Care – PPO

## 2013-12-07 ENCOUNTER — Encounter (HOSPITAL_COMMUNITY): Payer: Self-pay | Admitting: Emergency Medicine

## 2013-12-07 DIAGNOSIS — K5900 Constipation, unspecified: Secondary | ICD-10-CM

## 2013-12-07 DIAGNOSIS — F17200 Nicotine dependence, unspecified, uncomplicated: Secondary | ICD-10-CM

## 2013-12-07 DIAGNOSIS — T84498A Other mechanical complication of other internal orthopedic devices, implants and grafts, initial encounter: Secondary | ICD-10-CM

## 2013-12-07 LAB — CBC
Hemoglobin: 10.8 g/dL — ABNORMAL LOW (ref 13.0–17.0)
MCH: 32 pg (ref 26.0–34.0)
MCHC: 34.2 g/dL (ref 30.0–36.0)
MCV: 93.4 fL (ref 78.0–100.0)
MCV: 95 fL (ref 78.0–100.0)
Platelets: 414 10*3/uL — ABNORMAL HIGH (ref 150–400)
Platelets: 437 10*3/uL — ABNORMAL HIGH (ref 150–400)
RBC: 3.16 MIL/uL — ABNORMAL LOW (ref 4.22–5.81)
RBC: 3.38 MIL/uL — ABNORMAL LOW (ref 4.22–5.81)
RDW: 12.7 % (ref 11.5–15.5)
RDW: 12.8 % (ref 11.5–15.5)
WBC: 11.5 10*3/uL — ABNORMAL HIGH (ref 4.0–10.5)
WBC: 12.3 10*3/uL — ABNORMAL HIGH (ref 4.0–10.5)

## 2013-12-07 LAB — BASIC METABOLIC PANEL
BUN: 11 mg/dL (ref 6–23)
Calcium: 8.2 mg/dL — ABNORMAL LOW (ref 8.4–10.5)
Creatinine, Ser: 0.99 mg/dL (ref 0.50–1.35)
GFR calc Af Amer: >90 mL/min (ref >90)
GFR calc non Af Amer: >90 mL/min (ref >90)
Potassium: 4.1 mEq/L (ref 3.5–5.1)

## 2013-12-07 LAB — GLUCOSE, CAPILLARY: Glucose-Capillary: 130 mg/dL — ABNORMAL HIGH (ref 70–99)

## 2013-12-07 LAB — HEPATIC FUNCTION PANEL
AST: 78 U/L — ABNORMAL HIGH (ref 0–37)
Bilirubin, Direct: 0.3 mg/dL (ref 0.0–0.3)
Total Bilirubin: 0.8 mg/dL (ref 0.3–1.2)
Total Protein: 6.3 g/dL (ref 6.0–8.3)

## 2013-12-07 MED ORDER — ACETAMINOPHEN 325 MG PO TABS
650.0000 mg | ORAL_TABLET | Freq: Four times a day (QID) | ORAL | Status: DC | PRN
  Administered 2013-12-07: 650 mg via ORAL

## 2013-12-07 MED ORDER — CHLORHEXIDINE GLUCONATE 0.12 % MT SOLN
15.0000 mL | Freq: Two times a day (BID) | OROMUCOSAL | Status: DC
  Administered 2013-12-07 – 2013-12-09 (×4): 15 mL via OROMUCOSAL
  Filled 2013-12-07 (×7): qty 15

## 2013-12-07 MED ORDER — SODIUM CHLORIDE 0.9 % IJ SOLN
10.0000 mL | INTRAMUSCULAR | Status: DC | PRN
  Administered 2013-12-09: 10 mL
  Administered 2013-12-09: 20 mL
  Administered 2013-12-10 (×2): 10 mL

## 2013-12-07 MED ORDER — RIFAMPIN 300 MG PO CAPS
300.0000 mg | ORAL_CAPSULE | Freq: Every day | ORAL | Status: DC
  Administered 2013-12-07 – 2013-12-10 (×4): 300 mg via ORAL
  Filled 2013-12-07 (×4): qty 1

## 2013-12-07 MED ORDER — CHLORHEXIDINE GLUCONATE 4 % EX LIQD
CUTANEOUS | Status: AC
  Filled 2013-12-07: qty 15

## 2013-12-07 MED ORDER — DOCUSATE SODIUM 100 MG PO CAPS
100.0000 mg | ORAL_CAPSULE | Freq: Two times a day (BID) | ORAL | Status: DC
  Administered 2013-12-07 – 2013-12-10 (×6): 100 mg via ORAL
  Filled 2013-12-07 (×7): qty 1

## 2013-12-07 NOTE — Progress Notes (Signed)
RT Note: Pt has home CPAP at bedside for use per home settings.

## 2013-12-07 NOTE — Consult Note (Addendum)
INFECTIOUS DISEASE CONSULT NOTE  Date of Admission:  12/05/2013  Date of Consult:  12/07/2013  Reason for Consult: Wound infection Referring Physician: Magnus Ayers, C  Impression/Recommendation Wound infection R knee fracture, repair with hardware Constipation Tobacco use  Would: Stop zosyn Continue vanco Await ID, sensi Add rifampin Check HIV Check LFTs Bowel hygeine I encouraged him to quit smoking  See him back in 2-3 weeks in ID clinic.  Thank you so much for this interesting consult,   Johny Sax (pager) [phone] [email or web address]  Roger Ayers is an 47 y.o. male.  HPI: 47 yo M with hx falling from a 10' fall from ladder and landing on his feet on 11-17-13. He developed severe r knee pain. He underwent repair of R knee bicondylar tibial plateau fracture with severe comminution by external fixation on 11-17-13. On 11-28 he underwent removal of fixator and had ORIF with plate, screws and bone chips as filler.  He was d/c to home on 11-25-13.  He returned 12-10 with fever, pain and redness of his knee for 3 days as well as (bloody) drainage from his wound. He temps to 101 at home. In ED his temp was 99.9, WBC 24k and he was hyponatremic. He was taken to the OR and underwent I & D. He was started on vanco/zoyn.  His wound cx is now growing abundant Staph aureus.    Past Medical History  Diagnosis Date  . Hypertension   . GERD (gastroesophageal reflux disease)   . Sleep apnea     cpap    Past Surgical History  Procedure Laterality Date  . Mouth surgery    . Trigger finger release    . External fixation leg Right 11/17/2013    Procedure: EXTERNAL FIXATION LEG;  Surgeon: Cheral Almas, MD;  Location: [facility];  Service: Orthopedics;  Laterality: Right;  . Orif tibia plateau Right 11/23/2013    Procedure: OPEN REDUCTION INTERNAL FIXATION (ORIF) RIGHT TIBIAL PLATEAU;  Surgeon: Kathryne Hitch, MD;  Location: [facility];  Service: Orthopedics;   Laterality: Right;  . External fixation removal Right 11/23/2013    Procedure: REMOVAL EXTERNAL FIXATION RIGHT LEG;  Surgeon: Kathryne Hitch, MD;  Location: [facility];  Service: Orthopedics;  Laterality: Right;  . I&d extremity Right 12/05/2013    Procedure: IRRIGATION AND DEBRIDEMENT RIGHT LEG;  Surgeon: Kathryne Hitch, MD;  Location: [facility] OR;  Service: Orthopedics;  Laterality: Right;     Allergies  Allergen Reactions  . Thimerosal     Back when he was in high school (dicontinued med)    Medications:  Scheduled: . amLODipine  5 mg Oral Daily  . aspirin EC  325 mg Oral BID PC  . chlorhexidine      . lisinopril  5 mg Oral q morning - 10a  . multivitamin with minerals  1 tablet Oral Daily  . OxyCODONE  20 mg Oral Q12H  . pantoprazole  40 mg Oral Daily  . piperacillin-tazobactam (ZOSYN)  IV  3.375 g Intravenous Q8H  . vancomycin  1,000 mg Intravenous Q12H    Total days of antibiotics: 3 vanco/zosyn          Social History:  reports that he has been smoking Cigarettes.  He has a 5 pack-year smoking history. He has never used smokeless tobacco. He reports that he drinks about 4.2 ounces of alcohol per week. He reports that he does not use illicit drugs.  History reviewed. No pertinent family history.  General ROS: decreased appetite,  constipation, normal urination, pain in R foot, numbness r foot. see HPI.  Blood pressure 144/84, pulse 97, temperature 98.4 F (36.9 C), temperature source Oral, resp. rate 18, height 5\' 11"  (1.803 m), weight 117.935 kg (260 lb), SpO2 96.00%. General appearance: alert, cooperative and no distress Eyes: negative findings: pupils equal, round, reactive to light and accomodation Throat: lips, mucosa, and tongue normal; teeth and gums normal Neck: no adenopathy and supple, symmetrical, trachea midline Lungs: clear to auscultation bilaterally Heart: regular rate and rhythm Abdomen: normal findings: bowel sounds normal and soft,  non-tender Extremities: edema trace RLE. R knee wrapped.  and .  Neurologic: Sensory: normal light touch R foot.   Results for orders placed during the hospital encounter of 12/05/13 (from the past 48 hour(s))  ANAEROBIC CULTURE     Status: None   Collection Time    12/05/13  4:23 PM      Result Value Range   Specimen Description WOUND RIGHT LOWER LEG     Special Requests NONE     Gram Stain       Value: NO WBC SEEN     NO SQUAMOUS EPITHELIAL CELLS SEEN     ABUNDANT GRAM POSITIVE COCCI IN PAIRS     Performed at [facility]   Culture       Value: NO ANAEROBES ISOLATED; CULTURE IN PROGRESS FOR 5 DAYS     Performed at [facility]   Report Status PENDING    WOUND CULTURE     Status: None   Collection Time    12/05/13  4:23 PM      Result Value Range   Specimen Description WOUND RIGHT LOWER LEG     Special Requests NONE     Gram Stain       Value: NO WBC SEEN     NO SQUAMOUS EPITHELIAL CELLS SEEN     ABUNDANT GRAM POSITIVE COCCI IN PAIRS     Performed at [facility]   Culture       Value: ABUNDANT STAPHYLOCOCCUS AUREUS     Note: RIFAMPIN AND GENTAMICIN SHOULD NOT BE USED AS SINGLE DRUGS FOR TREATMENT OF STAPH INFECTIONS.     Performed at [facility]   Report Status PENDING    CBC     Status: Abnormal   Collection Time    12/06/13  4:52 AM      Result Value Range   WBC 18.1 (*) 4.0 - 10.5 K/uL   RBC 3.24 (*) 4.22 - 5.81 MIL/uL   Hemoglobin 10.3 (*) 13.0 - 17.0 g/dL   HCT 16.1 (*) 09.6 - 04.5 %   MCV 95.1  78.0 - 100.0 fL   MCH 31.8  26.0 - 34.0 pg   MCHC 33.4  30.0 - 36.0 g/dL   RDW 40.9  81.1 - 91.4 %   Platelets 398  150 - 400 K/uL  BASIC METABOLIC PANEL     Status: Abnormal   Collection Time    12/06/13  4:52 AM      Result Value Range   Sodium 128 (*) 135 - 145 mEq/L   Potassium 4.0  3.5 - 5.1 mEq/L   Chloride 94 (*) 96 - 112 mEq/L   CO2 27  19 - 32 mEq/L   Glucose, Bld 126 (*) 70 - 99 mg/dL   BUN 18  6 - 23 mg/dL    Creatinine, Ser 7.82  0.50 - 1.35 mg/dL   Calcium 7.9 (*) 8.4 -  10.5 mg/dL   GFR calc non Af Amer 88 (*) >90 mL/min   GFR calc Af Amer >90  >90 mL/min   Comment: (NOTE)     The eGFR has been calculated using the CKD EPI equation.     This calculation has not been validated in all clinical situations.     eGFR's persistently <90 mL/min signify possible Chronic Kidney     Disease.  C-REACTIVE PROTEIN     Status: Abnormal   Collection Time    12/06/13  4:52 AM      Result Value Range   CRP 44.3 (*) <0.60 mg/dL   Comment: (NOTE)     Result repeated and verified.     Result confirmed by automatic dilution.     Performed at [facility]  SEDIMENTATION RATE     Status: Abnormal   Collection Time    12/06/13  4:52 AM      Result Value Range   Sed Rate 104 (*) 0 - 16 mm/hr  GLUCOSE, CAPILLARY     Status: Abnormal   Collection Time    12/07/13  4:31 AM      Result Value Range   Glucose-Capillary 130 (*) 70 - 99 mg/dL  BASIC METABOLIC PANEL     Status: Abnormal   Collection Time    12/07/13  6:13 AM      Result Value Range   Sodium 130 (*) 135 - 145 mEq/L   Potassium 4.1  3.5 - 5.1 mEq/L   Chloride 94 (*) 96 - 112 mEq/L   CO2 27  19 - 32 mEq/L   Glucose, Bld 92  70 - 99 mg/dL   BUN 11  6 - 23 mg/dL   Creatinine, Ser 1.61  0.50 - 1.35 mg/dL   Calcium 8.2 (*) 8.4 - 10.5 mg/dL   GFR calc non Af Amer >90  >90 mL/min   GFR calc Af Amer >90  >90 mL/min   Comment: (NOTE)     The eGFR has been calculated using the CKD EPI equation.     This calculation has not been validated in all clinical situations.     eGFR's persistently <90 mL/min signify possible Chronic Kidney     Disease.  CBC     Status: Abnormal   Collection Time    12/07/13  6:13 AM      Result Value Range   WBC 11.5 (*) 4.0 - 10.5 K/uL   RBC 3.16 (*) 4.22 - 5.81 MIL/uL   Hemoglobin 10.1 (*) 13.0 - 17.0 g/dL   HCT 09.6 (*) 04.5 - 40.9 %   MCV 93.4  78.0 - 100.0 fL   MCH 32.0  26.0 - 34.0 pg   MCHC 34.2  30.0  - 36.0 g/dL   RDW 81.1  91.4 - 78.2 %   Platelets 414 (*) 150 - 400 K/uL      Component Value Date/Time   SDES WOUND RIGHT LOWER LEG 12/05/2013 1623   SDES WOUND RIGHT LOWER LEG 12/05/2013 1623   SPECREQUEST NONE 12/05/2013 1623   SPECREQUEST NONE 12/05/2013 1623   CULT  Value: NO ANAEROBES ISOLATED; CULTURE IN PROGRESS FOR 5 DAYS Performed at [facility] 12/05/2013 1623   CULT  Value: ABUNDANT STAPHYLOCOCCUS AUREUS Note: RIFAMPIN AND GENTAMICIN SHOULD NOT BE USED AS SINGLE DRUGS FOR TREATMENT OF STAPH INFECTIONS. Performed at [facility] 12/05/2013 1623   REPTSTATUS PENDING 12/05/2013 1623   REPTSTATUS PENDING 12/05/2013 1623   Ir Fluoro  Guide Cv Line Right  12/07/2013   CLINICAL DATA:  Trauma, extremity fracture, access for IV antibiotics  EXAM: Right upper extremity PICC LINE exchange WITH FLUOROSCOPIC GUIDANCE  FLUOROSCOPY TIME:  12 seconds  PROCEDURE: The patient was advised of the possible risks and complications and agreed to undergo the procedure. The patient was then brought to the angiographic suite for the procedure.  The right arm was prepped with chlorhexidine, draped in the usual sterile fashion using maximum barrier technique (cap and mask, sterile gown, sterile gloves, large sterile sheet, hand hygiene and cutaneous antisepsis) and infiltrated locally with 1% Lidocaine.  Under sterile conditions and local anesthesia, the existing malpositioned right PICC line was cut and removed over a guidewire. A new PICC line was advanced to the lower SVC/right atrial junction. Fluoroscopy during the procedure and fluoro spot radiograph confirms appropriate catheter position. The catheter was flushed and covered with a sterile dressing.  Complications: No immediate  IMPRESSION: Successful right upper extremity PICC line replacement. The catheter is ready for use.   Electronically Signed   By: Ruel Favors M.D.   On: 12/07/2013 09:32   Dg Chest Port 1 View  12/06/2013    CLINICAL DATA:  PICC placement  EXAM: PORTABLE CHEST - 1 VIEW  COMPARISON:  12/05/2013  FINDINGS: Right PICC extends cephalad into the right internal jugular vein and above the included field of view.  Heart, mediastinum and hila are unremarkable. The lungs are clear. No pleural effusion or pneumothorax.  IMPRESSION: Right PICC tip extends superiorly and to the right internal jugular vein.  No acute cardiopulmonary disease.   Electronically Signed   By: Amie Portland M.D.   On: 12/06/2013 21:16   Recent Results (from the past 240 hour(s))  ANAEROBIC CULTURE     Status: None   Collection Time    12/05/13  4:23 PM      Result Value Range Status   Specimen Description WOUND RIGHT LOWER LEG   Final   Special Requests NONE   Final   Gram Stain     Final   Value: NO WBC SEEN     NO SQUAMOUS EPITHELIAL CELLS SEEN     ABUNDANT GRAM POSITIVE COCCI IN PAIRS     Performed at [facility]   Culture     Final   Value: NO ANAEROBES ISOLATED; CULTURE IN PROGRESS FOR 5 DAYS     Performed at [facility]   Report Status PENDING   Incomplete  WOUND CULTURE     Status: None   Collection Time    12/05/13  4:23 PM      Result Value Range Status   Specimen Description WOUND RIGHT LOWER LEG   Final   Special Requests NONE   Final   Gram Stain     Final   Value: NO WBC SEEN     NO SQUAMOUS EPITHELIAL CELLS SEEN     ABUNDANT GRAM POSITIVE COCCI IN PAIRS     Performed at [facility]   Culture     Final   Value: ABUNDANT STAPHYLOCOCCUS AUREUS     Note: RIFAMPIN AND GENTAMICIN SHOULD NOT BE USED AS SINGLE DRUGS FOR TREATMENT OF STAPH INFECTIONS.     Performed at [facility]   Report Status PENDING   Incomplete      12/07/2013, 3:37 PM     LOS: 2 days

## 2013-12-07 NOTE — Progress Notes (Signed)
Subjective: 2 Days Post-Op Procedure(s) (LRB): IRRIGATION AND DEBRIDEMENT RIGHT LEG (Right) Patient reports pain as moderate.  Better felling over the last 24 hours.  PICC line in place.  Labs not back yet this am.  Objective: Vital signs in last 24 hours: Temp:  [98.9 F (37.2 C)-100.2 F (37.9 C)] 99.1 F (37.3 C) (12/12 0600) Pulse Rate:  [80-96] 80 (12/12 0600) Resp:  [18] 18 (12/12 0600) BP: (123-136)/(61-74) 130/70 mmHg (12/12 0600) SpO2:  [91 %-98 %] 98 % (12/12 0600)  Intake/Output from previous day: 12/11 0701 - 12/12 0700 In: 1200 [P.O.:1200] Out: 3200 [Urine:3150; Drains:50] Intake/Output this shift:     Recent Labs  12/05/13 1305 12/06/13 0452  HGB 11.6* 10.3*    Recent Labs  12/05/13 1305 12/06/13 0452  WBC 24.0* 18.1*  RBC 3.62* 3.24*  HCT 34.5* 30.8*  PLT 433* 398    Recent Labs  12/05/13 1305 12/06/13 0452  NA 126* 128*  K 4.4 4.0  CL 87* 94*  CO2 25 27  BUN 21 18  CREATININE 1.09 1.00  GLUCOSE 112* 126*  CALCIUM 8.6 7.9*   No results found for this basename: LABPT, INR,  in the last 72 hours  Sensation intact distally Intact pulses distally Dorsiflexion/Plantar flexion intact Incision: dressing C/D/I Compartment soft  Assessment/Plan: 2 Days Post-Op Procedure(s) (LRB): IRRIGATION AND DEBRIDEMENT RIGHT LEG (Right) Up with therapy, NWB right leg. Continue IV antibiotics. ID consult  Kathryne Hitch 12/07/2013, 7:08 AM

## 2013-12-07 NOTE — Care Management Note (Signed)
CARE MANAGEMENT NOTE 12/07/2013  Patient:  Roger Ayers, Roger Ayers   Account Number:  [id number]  Date Initiated:  12/07/2013  Documentation initiated by:  Vance Peper  Subjective/Objective Assessment:   47 yr old male s/p infection of ORIF of right tibial plateau fracture     Action/Plan:   Patient will require IV antibiotics at home. has PICC line. CM spoke with patient concerning need for [facility] for IV antibiotics.  Patient already receiving HHPT.   Anticipated DC Date:  12/08/2013   Anticipated DC Plan:  HOME W HOME HEALTH SERVICES      DC Planning Services  CM consult      [facility] Choice  HOME HEALTH   Choice offered to / List presented to:  C-1 Patient        HH arranged  HH-1 RN  IV Antibiotics      HH agency  [facility]   Status of service:  Completed, signed off Medicare Important Message given?   (If response is "NO", the following Medicare IM given date fields will be blank) Date Medicare IM given:   Date Additional Medicare IM given:    Discharge Disposition:  HOME W HOME HEALTH SERVICES

## 2013-12-07 NOTE — Progress Notes (Signed)
Spoke with Raynelle Fanning in Interventional Radiology regarding PICC needing to be repositioned out of the IJ.  I also spoke with the primary RN on [facility] to put in order for IR to reposition PICC, per IR's request.

## 2013-12-07 NOTE — Progress Notes (Signed)
Patient has home CPAP set up in room.  Patient states he does not need assistance at this time.  Encouraged to call if needed.  No issues noted at this time.

## 2013-12-07 NOTE — Progress Notes (Signed)
Oral and Maxillofacial Surgery  Roger Ayers is a 47 y.o. male patient that is one month post s/p bone grafting in the left maxilla.  The patient has been hospitalized and immobilized recently due to a left orthopedic injury (Tibial plateau fracture).    Exam: Suture line intact at site #12 with some food debris.  There is some gingival recession along the medial aspect of #13.  There is no evidence of infection.  Bone grafting appears to be intact.    A/P: The patient is healing well s/p bone grafting at site #12. 1. Removed intraoral sutures today at the bedside (patient cannot follow up in office due to is orthopedic injuries according to the patient). 2. Recommend follow up 3 months s/p surgery.  3. Recommend peridex mouth rinse bid with mouth care.   Lamb,Claryssa Sandner L 12/07/2013, 3:55 PM

## 2013-12-07 NOTE — Evaluation (Signed)
Physical Therapy Evaluation Patient Details Name: Roger Ayers MRN: [id number] DOB: 1966/12/04 Today's Date: 12/07/2013 Time: 4098-1191 PT Time Calculation (min): 27 min  PT Assessment / Plan / Recommendation History of Present Illness  Pt is a 47 y/o male admitted s/p I&D of an infected ORIF of the R tibial plateau.  Clinical Impression  This patient presents with acute pain and decreased functional independence following the above mentioned procedure. At the time of PT eval, pt required min assist for movement of the RLE to EOB. Pt fatigued very quickly with ambulation, and because of this stairs were deferred for evaluation. This patient is appropriate for skilled PT interventions to address functional limitations, improve safety and independence with functional mobility, and return to PLOF.     PT Assessment  Patient needs continued PT services    Follow Up Recommendations  Home health PT    Does the patient have the potential to tolerate intense rehabilitation      Barriers to Discharge        Equipment Recommendations  None recommended by PT    Recommendations for Other Services     Frequency Min 5X/week    Precautions / Restrictions Precautions Precautions: Fall Required Braces or Orthoses: Other Brace/Splint Other Brace/Splint: Bledsoe RLE Restrictions Weight Bearing Restrictions: Yes RLE Weight Bearing: Non weight bearing   Pertinent Vitals/Pain Pt reports 6/10 at rest and 8/10 during functional mobility.       Mobility  Bed Mobility Bed Mobility: Supine to Sit;Sitting - Scoot to Edge of Bed Supine to Sit: 4: Min assist;HOB flat;With rails Sitting - Scoot to Edge of Bed: 5: Supervision Details for Bed Mobility Assistance: Min assist for RLE support and movement to EOB. Transfers Transfers: Sit to Stand;Stand to Sit Sit to Stand: 4: Min guard;With upper extremity assist;From bed Stand to Sit: 4: Min guard;To chair/3-in-1;With upper extremity  assist Details for Transfer Assistance: VC's for hand placement on seated surface.  Ambulation/Gait Ambulation/Gait Assistance: 4: Min guard Ambulation Distance (Feet): 15 Feet Assistive device: Rolling walker Ambulation/Gait Assistance Details: VC's for sequencing and safety awareness with RW. Pt fatigued very quickly Gait Pattern:  (2-point gait pattern as pt is NWB on Right.) Gait velocity: decreased Stairs: No Wheelchair Mobility Wheelchair Mobility: No    Exercises     PT Diagnosis: Difficulty walking;Acute pain  PT Problem List: Decreased strength;Decreased range of motion;Decreased activity tolerance;Decreased balance;Decreased mobility;Decreased knowledge of use of DME;Decreased safety awareness;Pain PT Treatment Interventions: DME instruction;Gait training;Stair training;Functional mobility training;Therapeutic activities;Therapeutic exercise;Neuromuscular re-education;Patient/family education     PT Goals(Current goals can be found in the care plan section) Acute Rehab PT Goals Patient Stated Goal: return to work PT Goal Formulation: With patient Time For Goal Achievement: 12/14/13 Potential to Achieve Goals: Good  Visit Information  Last PT Received On: 12/07/13 Assistance Needed: +1 History of Present Illness: Pt is a 47 y/o male admitted s/p I&D of an infected ORIF of the R tibial plateau.       Prior Functioning  Home Living Family/patient expects to be discharged to:: Private residence Living Arrangements: Spouse/significant other Available Help at Discharge: Family;Available 24 hours/day Type of Home: House Home Access: Stairs to enter [employer] of Steps: 3 (Slightly uneven ground to get to back steps) Entrance Stairs-Rails: None Home Layout: One level Home Equipment: Walker - 2 wheels;Bedside commode;Tub bench Prior Function Level of Independence: Needs assistance Gait / Transfers Assistance Needed: Ambulates NWB with walker ADL's /  Homemaking Assistance Needed: Wife assists with bathing/dressing Communication  Communication: No difficulties Dominant Hand: Right    Cognition  Cognition Arousal/Alertness: Awake/alert Behavior During Therapy: WFL for tasks assessed/performed Overall Cognitive Status: Within Functional Limits for tasks assessed    Extremity/Trunk Assessment Upper Extremity Assessment Upper Extremity Assessment: Overall WFL for tasks assessed Lower Extremity Assessment Lower Extremity Assessment: Overall WFL for tasks assessed;RLE deficits/detail RLE Deficits / Details: Decreased strength and AROM consistent with above mentioned procedure.  RLE: Unable to fully assess due to pain;Unable to fully assess due to immobilization Cervical / Trunk Assessment Cervical / Trunk Assessment: Normal   Balance    End of Session PT - End of Session Equipment Utilized During Treatment: Gait belt Activity Tolerance: Patient limited by fatigue;Patient limited by pain Patient left: in chair;with call bell/phone within reach Nurse Communication: Mobility status  GP     Ruthann Cancer 12/07/2013, 3:51 PM  Ruthann Cancer, PT, DPT [phone]

## 2013-12-07 NOTE — Procedures (Signed)
successsful RUE SL POWER PICC REPLACEMENT TIP SVC/RA NO COMP READY FOR USE

## 2013-12-08 ENCOUNTER — Encounter (HOSPITAL_COMMUNITY)
Admission: AD | Disposition: A | Discharge status: Home Health | Payer: Self-pay | Source: Ambulatory Visit | Attending: Orthopaedic Surgery

## 2013-12-08 ENCOUNTER — Inpatient Hospital Stay (HOSPITAL_COMMUNITY): Payer: BC Managed Care – PPO | Admitting: Anesthesiology

## 2013-12-08 ENCOUNTER — Encounter (HOSPITAL_COMMUNITY): Payer: Self-pay | Admitting: Certified Registered Nurse Anesthetist

## 2013-12-08 ENCOUNTER — Encounter (HOSPITAL_COMMUNITY): Payer: BC Managed Care – PPO | Admitting: Anesthesiology

## 2013-12-08 HISTORY — PX: I & D EXTREMITY: SHX5045

## 2013-12-08 LAB — BASIC METABOLIC PANEL
BUN: 12 mg/dL (ref 6–23)
Calcium: 8.5 mg/dL (ref 8.4–10.5)
Creatinine, Ser: 0.89 mg/dL (ref 0.50–1.35)
GFR calc Af Amer: >90 mL/min (ref >90)
GFR calc non Af Amer: >90 mL/min (ref >90)
Potassium: 4 mEq/L (ref 3.5–5.1)

## 2013-12-08 LAB — WOUND CULTURE: Gram Stain: NONE SEEN

## 2013-12-08 LAB — CBC
HCT: 31.8 % — ABNORMAL LOW (ref 39.0–52.0)
Hemoglobin: 10.8 g/dL — ABNORMAL LOW (ref 13.0–17.0)
MCHC: 34 g/dL (ref 30.0–36.0)
Platelets: 447 10*3/uL — ABNORMAL HIGH (ref 150–400)
RBC: 3.38 MIL/uL — ABNORMAL LOW (ref 4.22–5.81)
RDW: 12.6 % (ref 11.5–15.5)
WBC: 11.5 10*3/uL — ABNORMAL HIGH (ref 4.0–10.5)

## 2013-12-08 LAB — GLUCOSE, CAPILLARY

## 2013-12-08 LAB — VANCOMYCIN, TROUGH: Vancomycin Tr: <5 ug/mL — ABNORMAL LOW (ref 10.0–20.0)

## 2013-12-08 SURGERY — IRRIGATION AND DEBRIDEMENT EXTREMITY
Anesthesia: General | Site: Leg Lower | Laterality: Right

## 2013-12-08 MED ORDER — VANCOMYCIN HCL IN DEXTROSE 1-5 GM/200ML-% IV SOLN
1000.0000 mg | Freq: Three times a day (TID) | INTRAVENOUS | Status: DC
  Administered 2013-12-08 – 2013-12-09 (×3): 1000 mg via INTRAVENOUS
  Filled 2013-12-08 (×5): qty 200

## 2013-12-08 MED ORDER — OXYCODONE HCL 5 MG PO TABS
ORAL_TABLET | ORAL | Status: AC
  Administered 2013-12-08: 10 mg via ORAL
  Filled 2013-12-08: qty 2

## 2013-12-08 MED ORDER — ONDANSETRON HCL 4 MG/2ML IJ SOLN
INTRAMUSCULAR | Status: DC | PRN
  Administered 2013-12-08: 4 mg via INTRAVENOUS

## 2013-12-08 MED ORDER — OXYCODONE HCL ER 20 MG PO T12A
40.0000 mg | EXTENDED_RELEASE_TABLET | Freq: Two times a day (BID) | ORAL | Status: DC
  Administered 2013-12-08 – 2013-12-10 (×4): 40 mg via ORAL
  Filled 2013-12-08 (×4): qty 4

## 2013-12-08 MED ORDER — MIDAZOLAM HCL 5 MG/5ML IJ SOLN
INTRAMUSCULAR | Status: DC | PRN
  Administered 2013-12-08: 2 mg via INTRAVENOUS

## 2013-12-08 MED ORDER — HYDROMORPHONE HCL PF 1 MG/ML IJ SOLN
INTRAMUSCULAR | Status: AC
  Administered 2013-12-08: 0.5 mg via INTRAVENOUS
  Filled 2013-12-08: qty 2

## 2013-12-08 MED ORDER — HYDROMORPHONE HCL PF 1 MG/ML IJ SOLN: 1.0000 mg | INTRAMUSCULAR | Status: DC | PRN

## 2013-12-08 MED ORDER — PROPOFOL 10 MG/ML IV BOLUS
INTRAVENOUS | Status: DC | PRN
  Administered 2013-12-08: 200 mg via INTRAVENOUS

## 2013-12-08 MED ORDER — FENTANYL CITRATE 0.05 MG/ML IJ SOLN
INTRAMUSCULAR | Status: DC | PRN
  Administered 2013-12-08 (×3): 25 ug via INTRAVENOUS
  Administered 2013-12-08: 50 ug via INTRAVENOUS
  Administered 2013-12-08 (×4): 25 ug via INTRAVENOUS
  Administered 2013-12-08: 50 ug via INTRAVENOUS
  Administered 2013-12-08 (×9): 25 ug via INTRAVENOUS

## 2013-12-08 MED ORDER — TOBRAMYCIN SULFATE 1.2 G IJ SOLR
INTRAMUSCULAR | Status: DC | PRN
  Administered 2013-12-08: 2.4 g

## 2013-12-08 MED ORDER — SODIUM CHLORIDE 0.9 % IR SOLN
Status: DC | PRN
  Administered 2013-12-08: 1000 mL

## 2013-12-08 MED ORDER — MUPIROCIN 2 % EX OINT
1.0000 "application " | TOPICAL_OINTMENT | Freq: Two times a day (BID) | CUTANEOUS | Status: DC
  Administered 2013-12-08 – 2013-12-10 (×5): 1 via NASAL
  Filled 2013-12-08: qty 22

## 2013-12-08 MED ORDER — LIDOCAINE HCL (CARDIAC) 20 MG/ML IV SOLN
INTRAVENOUS | Status: DC | PRN
  Administered 2013-12-08 (×2): 50 mg via INTRAVENOUS

## 2013-12-08 MED ORDER — SODIUM CHLORIDE 0.9 % IR SOLN
Status: DC | PRN
  Administered 2013-12-08: 7000 mL

## 2013-12-08 MED ORDER — SODIUM CHLORIDE 0.9 % IR SOLN
Status: DC | PRN
  Administered 2013-12-08: 10:00:00

## 2013-12-08 MED ORDER — METHOCARBAMOL 500 MG PO TABS
ORAL_TABLET | ORAL | Status: AC
  Administered 2013-12-08: 500 mg via ORAL
  Filled 2013-12-08: qty 1

## 2013-12-08 MED ORDER — TOBRAMYCIN SULFATE 1.2 G IJ SOLR
INTRAMUSCULAR | Status: AC
  Filled 2013-12-08: qty 2.4

## 2013-12-08 MED ORDER — VANCOMYCIN HCL 500 MG IV SOLR
INTRAVENOUS | Status: AC
  Filled 2013-12-08: qty 500

## 2013-12-08 MED ORDER — CHLORHEXIDINE GLUCONATE CLOTH 2 % EX PADS
6.0000 | MEDICATED_PAD | Freq: Every day | CUTANEOUS | Status: DC
  Administered 2013-12-09 – 2013-12-10 (×2): 6 via TOPICAL

## 2013-12-08 MED ORDER — LACTATED RINGERS IV SOLN
INTRAVENOUS | Status: DC | PRN
  Administered 2013-12-08 (×2): via INTRAVENOUS

## 2013-12-08 MED ORDER — VANCOMYCIN HCL 500 MG IV SOLR
INTRAVENOUS | Status: DC | PRN
  Administered 2013-12-08: 500 mg

## 2013-12-08 MED ORDER — HYDROMORPHONE HCL PF 1 MG/ML IJ SOLN
0.5000 mg | INTRAMUSCULAR | Status: AC | PRN
  Administered 2013-12-08 (×4): 0.5 mg via INTRAVENOUS

## 2013-12-08 MED ORDER — ARTIFICIAL TEARS OP OINT
TOPICAL_OINTMENT | OPHTHALMIC | Status: DC | PRN
  Administered 2013-12-08: 1 via OPHTHALMIC

## 2013-12-08 SURGICAL SUPPLY — 65 items
BAG DECANTER FOR FLEXI CONT (MISCELLANEOUS) ×2 IMPLANT
BANDAGE CONFORM 3  STR LF (GAUZE/BANDAGES/DRESSINGS) IMPLANT
BANDAGE ELASTIC 3 VELCRO ST LF (GAUZE/BANDAGES/DRESSINGS) IMPLANT
BANDAGE ELASTIC 6 VELCRO ST LF (GAUZE/BANDAGES/DRESSINGS) ×4 IMPLANT
BLADE SURG 10 STRL SS (BLADE) ×2 IMPLANT
BNDG COHESIVE 1X5 TAN STRL LF (GAUZE/BANDAGES/DRESSINGS) IMPLANT
BNDG COHESIVE 4X5 TAN STRL (GAUZE/BANDAGES/DRESSINGS) ×2 IMPLANT
BNDG COHESIVE 6X5 TAN STRL LF (GAUZE/BANDAGES/DRESSINGS) ×2 IMPLANT
BNDG GAUZE STRTCH 6 (GAUZE/BANDAGES/DRESSINGS) IMPLANT
CLOTH BEACON ORANGE TIMEOUT ST (SAFETY) ×2 IMPLANT
CORDS BIPOLAR (ELECTRODE) IMPLANT
COVER SURGICAL LIGHT HANDLE (MISCELLANEOUS) ×2 IMPLANT
CUFF TOURNIQUET SINGLE 18IN (TOURNIQUET CUFF) IMPLANT
CUFF TOURNIQUET SINGLE 24IN (TOURNIQUET CUFF) IMPLANT
CUFF TOURNIQUET SINGLE 34IN LL (TOURNIQUET CUFF) IMPLANT
CUFF TOURNIQUET SINGLE 44IN (TOURNIQUET CUFF) IMPLANT
DRAPE ORTHO SPLIT 77X108 STRL (DRAPES) ×3
DRAPE SURG 17X23 STRL (DRAPES) IMPLANT
DRAPE SURG ORHT 6 SPLT 77X108 (DRAPES) ×3 IMPLANT
DRAPE U-SHAPE 47X51 STRL (DRAPES) ×2 IMPLANT
DURAPREP 26ML APPLICATOR (WOUND CARE) IMPLANT
ELECT CAUTERY BLADE 6.4 (BLADE) IMPLANT
ELECT REM PT RETURN 9FT ADLT (ELECTROSURGICAL) ×2
ELECTRODE REM PT RTRN 9FT ADLT (ELECTROSURGICAL) ×1 IMPLANT
GAUZE XEROFORM 1X8 LF (GAUZE/BANDAGES/DRESSINGS) IMPLANT
GAUZE XEROFORM 5X9 LF (GAUZE/BANDAGES/DRESSINGS) ×2 IMPLANT
GLOVE BIO SURGEON STRL SZ8 (GLOVE) ×6 IMPLANT
GLOVE BIOGEL PI IND STRL 8 (GLOVE) ×2 IMPLANT
GLOVE BIOGEL PI INDICATOR 8 (GLOVE) ×2
GLOVE ORTHO TXT STRL SZ7.5 (GLOVE) ×4 IMPLANT
GOWN PREVENTION PLUS LG XLONG (DISPOSABLE) IMPLANT
GOWN PREVENTION PLUS XLARGE (GOWN DISPOSABLE) ×4 IMPLANT
GOWN STRL NON-REIN LRG LVL3 (GOWN DISPOSABLE) ×2 IMPLANT
HANDPIECE INTERPULSE COAX TIP (DISPOSABLE) ×1
KIT BASIN OR (CUSTOM PROCEDURE TRAY) ×2 IMPLANT
KIT ROOM TURNOVER OR (KITS) ×2 IMPLANT
KIT STIMULAN RAPID CURE  10CC (Orthopedic Implant) ×1 IMPLANT
KIT STIMULAN RAPID CURE 10CC (Orthopedic Implant) ×1 IMPLANT
MANIFOLD NEPTUNE II (INSTRUMENTS) ×2 IMPLANT
NEEDLE 18GX1X1/2 (RX/OR ONLY) (NEEDLE) ×2 IMPLANT
NS IRRIG 1000ML POUR BTL (IV SOLUTION) ×2 IMPLANT
PACK ORTHO EXTREMITY (CUSTOM PROCEDURE TRAY) ×2 IMPLANT
PAD ARMBOARD 7.5X6 YLW CONV (MISCELLANEOUS) ×4 IMPLANT
PADDING CAST ABS 4INX4YD NS (CAST SUPPLIES)
PADDING CAST ABS COTTON 4X4 ST (CAST SUPPLIES) IMPLANT
PADDING CAST COTTON 6X4 STRL (CAST SUPPLIES) ×4 IMPLANT
SET HNDPC FAN SPRY TIP SCT (DISPOSABLE) ×1 IMPLANT
SPONGE GAUZE 4X4 12PLY (GAUZE/BANDAGES/DRESSINGS) IMPLANT
SPONGE LAP 18X18 X RAY DECT (DISPOSABLE) ×4 IMPLANT
STOCKINETTE IMPERVIOUS 9X36 MD (GAUZE/BANDAGES/DRESSINGS) ×2 IMPLANT
SUT ETHILON 2 0 FS 18 (SUTURE) IMPLANT
SUT ETHILON 2 0 PSLX (SUTURE) ×10 IMPLANT
SUT ETHILON 3 0 PS 1 (SUTURE) IMPLANT
SUT PDS AB 0 CT 36 (SUTURE) ×2 IMPLANT
SUT PDS AB 2-0 CT1 27 (SUTURE) ×4 IMPLANT
SYR 20CC LL (SYRINGE) ×2 IMPLANT
SYR CONTROL 10ML LL (SYRINGE) IMPLANT
TOWEL OR 17X24 6PK STRL BLUE (TOWEL DISPOSABLE) ×2 IMPLANT
TOWEL OR 17X26 10 PK STRL BLUE (TOWEL DISPOSABLE) ×2 IMPLANT
TUBE ANAEROBIC SPECIMEN COL (MISCELLANEOUS) IMPLANT
TUBE CONNECTING 12X1/4 (SUCTIONS) ×2 IMPLANT
TUBE FEEDING 5FR 15 INCH (TUBING) IMPLANT
UNDERPAD 30X30 INCONTINENT (UNDERPADS AND DIAPERS) ×2 IMPLANT
WATER STERILE IRR 1000ML POUR (IV SOLUTION) IMPLANT
YANKAUER SUCT BULB TIP NO VENT (SUCTIONS) ×8 IMPLANT

## 2013-12-08 NOTE — H&P (Signed)
  Roger Ayers understands that we are returning to the OR today for a repeat I&D of his right leg and gives informed consent.

## 2013-12-08 NOTE — Progress Notes (Signed)
Physical Therapy Treatment Patient Details Name: Zakaria Sedor MRN: [id number] DOB: February 21, 1966 Today's Date: 12/08/2013 Time: 2536-6440 PT Time Calculation (min): 24 min  PT Assessment / Plan / Recommendation  History of Present Illness     PT Comments   Pt underwent second I&D in OR this AM, limiting his ability to participate in PT PM due to pain/fatigue.  Follow Up Recommendations  Home health PT     Does the patient have the potential to tolerate intense rehabilitation     Barriers to Discharge        Equipment Recommendations  None recommended by PT    Recommendations for Other Services    Frequency Min 5X/week   Progress towards PT Goals Progress towards PT goals: Progressing toward goals  Plan Current plan remains appropriate    Precautions / Restrictions Precautions Precautions: Fall Required Braces or Orthoses: Other Brace/Splint Other Brace/Splint: Bledsoe RLE Restrictions RLE Weight Bearing: Non weight bearing   Pertinent Vitals/Pain 9/10    Mobility  Bed Mobility Supine to Sit: 4: Min assist;With rails;HOB elevated Sitting - Scoot to Edge of Bed: 4: Min assist;With rail Details for Bed Mobility Assistance: assist for RLE support Transfers Transfers: Water engineer Transfers Sit to Stand: 4: Min guard;From bed;With upper extremity assist Stand to Sit: 4: Min guard;To chair/3-in-1;With upper extremity assist;With armrests Stand Pivot Transfers: 4: Min guard;Other (comment) (with RW) Details for Transfer Assistance: verbal cues for sequencing    Exercises     PT Diagnosis:    PT Problem List:   PT Treatment Interventions:     PT Goals (current goals can now be found in the care plan section)    Visit Information  Last PT Received On: 12/08/13 Assistance Needed: +1    Subjective Data      Cognition  Cognition Arousal/Alertness: Awake/alert Behavior During Therapy: [facility] for tasks assessed/performed Overall Cognitive Status: Within Functional  Limits for tasks assessed    Balance     End of Session PT - End of Session Equipment Utilized During Treatment: Gait belt Activity Tolerance: Patient limited by pain;Patient limited by fatigue Patient left: in chair;with call bell/phone within reach;with family/visitor present Nurse Communication: Patient requests pain meds   GP     Ilda Foil 12/08/2013, 2:12 PM  Aida Raider, PT  Office # [phone] Pager [phone]

## 2013-12-08 NOTE — Progress Notes (Signed)
RT Note: Pt has home CPAP at bedside and does not need any further assistance. RT will continue to monitor

## 2013-12-08 NOTE — Preoperative (Signed)
Beta Blockers   Reason not to administer Beta Blockers:Not Applicable 

## 2013-12-08 NOTE — Progress Notes (Signed)
ANTIBIOTIC CONSULT NOTE - FOLLOW UP  Pharmacy Consult for Vancomycin  Indication: R-leg wound infection   Allergies  Allergen Reactions  . Thimerosal     Back when he was in high school (dicontinued med)    Patient Measurements: Height: 5\' 11"  (180.3 cm) Weight: 260 lb (117.935 kg) IBW/kg (Calculated) : 75.3  Vital Signs: Temp: 98.7 F (37.1 C) (12/13 0419) Temp src: Oral (12/13 0419) BP: 117/52 mmHg (12/13 0419) Pulse Rate: 81 (12/13 0419) Intake/Output from previous day: 12/12 0701 - 12/13 0700 In: 600 [P.O.:600] Out: 1550 [Urine:1550] Intake/Output from this shift: Total I/O In: -  Out: 800 [Urine:800]  Labs:  Recent Labs  12/06/13 0452 12/07/13 0613 12/07/13 1703 12/08/13 0330  WBC 18.1* 11.5* 12.3* 11.5*  HGB 10.3* 10.1* 10.8* 10.8*  PLT 398 414* 437* 447*  CREATININE 1.00 0.99  --  0.89   Estimated Creatinine Clearance: 134 ml/min (by C-G formula based on Cr of 0.89).  Recent Labs  12/08/13 0330  VANCOTROUGH <5.0*     Microbiology: Recent Results (from the past 720 hour(s))  SURGICAL PCR SCREEN     Status: Abnormal   Collection Time    11/22/13  4:18 PM      Result Value Range Status   MRSA, PCR NEGATIVE  NEGATIVE Final   Staphylococcus aureus POSITIVE (*) NEGATIVE Final   Comment:            The Xpert SA Assay (FDA     approved for NASAL specimens     in patients over 16 years of age),     is one component of     a comprehensive surveillance     program.  Test performance has     been validated by [employer] for patients greater     than or equal to 11 year old.     It is not intended     to diagnose infection nor to     guide or monitor treatment.  ANAEROBIC CULTURE     Status: None   Collection Time    12/05/13  4:23 PM      Result Value Range Status   Specimen Description WOUND RIGHT LOWER LEG   Final   Special Requests NONE   Final   Gram Stain     Final   Value: NO WBC SEEN     NO SQUAMOUS EPITHELIAL CELLS SEEN   ABUNDANT GRAM POSITIVE COCCI IN PAIRS     Performed at [facility]   Culture     Final   Value: NO ANAEROBES ISOLATED; CULTURE IN PROGRESS FOR 5 DAYS     Performed at [facility]   Report Status PENDING   Incomplete  WOUND CULTURE     Status: None   Collection Time    12/05/13  4:23 PM      Result Value Range Status   Specimen Description WOUND RIGHT LOWER LEG   Final   Special Requests NONE   Final   Gram Stain     Final   Value: NO WBC SEEN     NO SQUAMOUS EPITHELIAL CELLS SEEN     ABUNDANT GRAM POSITIVE COCCI IN PAIRS     Performed at [facility]   Culture     Final   Value: ABUNDANT STAPHYLOCOCCUS AUREUS     Note: RIFAMPIN AND GENTAMICIN SHOULD NOT BE USED AS SINGLE DRUGS FOR TREATMENT OF STAPH INFECTIONS.  Performed at [facility]   Report Status PENDING   Incomplete    Anti-infectives   Start     Dose/Rate Route Frequency Ordered Stop   12/07/13 1700  rifampin (RIFADIN) capsule 300 mg     300 mg Oral Daily 12/07/13 1608     12/06/13 0400  vancomycin (VANCOCIN) IVPB 1000 mg/200 mL premix     1,000 mg 200 mL/hr over 60 Minutes Intravenous Every 12 hours 12/05/13 2018     12/05/13 2130  piperacillin-tazobactam (ZOSYN) IVPB 3.375 g  Status:  Discontinued     3.375 g 12.5 mL/hr over 240 Minutes Intravenous Every 8 hours 12/05/13 2018 12/07/13 1608   12/05/13 1600  vancomycin (VANCOCIN) IVPB 1000 mg/200 mL premix  Status:  Discontinued     1,000 mg 200 mL/hr over 60 Minutes Intravenous  Once 12/05/13 1548 12/05/13 1831   12/05/13 1544  vancomycin (VANCOCIN) 1 GM/200ML IVPB    Comments:  Merril Abbe   : cabinet override      12/05/13 1544 12/05/13 1621      Assessment: 47 y/o M with staph aureus r-leg wound infection, still awaiting to differentiate MSSA/MRSA. On vancomycin per pharmacy. Vancomycin trough is <5 this AM. Other labs as above.   Goal of Therapy:  Vancomycin trough level 15-20 mcg/ml  Plan:  -Change vancomycin to  1000 mg IV q8h -Recheck vancomycin trough with 4-5th dose -F/U sensi, possible different agent if MSSA  Thank you for allowing me to take part in this patient's care,  Abran Duke, PharmD Clinical Pharmacist Phone: [phone] Pager: [phone] 12/08/2013 5:09 AM

## 2013-12-08 NOTE — Brief Op Note (Signed)
12/05/2013 - 12/08/2013  10:15 AM  PATIENT:  Roger Ayers  47 y.o. male  PRE-OPERATIVE DIAGNOSIS:  Right Leg Infection  POST-OPERATIVE DIAGNOSIS:  Right Leg Infection  PROCEDURE:  Procedure(s): Repeat IRRIGATION AND DEBRIDEMENT Right Leg with Stimulan bead placement (Right)  SURGEON:  Surgeon(s) and Role:    * Kathryne Hitch, MD - Primary  PHYSICIAN ASSISTANT: Rexene Edison, PA-C  ANESTHESIA:   general  EBL:  Total I/O In: 1000 [I.V.:1000] Out: 325 [Urine:325]  BLOOD ADMINISTERED:none  DRAINS: none   LOCAL MEDICATIONS USED:  NONE  SPECIMEN:  No Specimen  DISPOSITION OF SPECIMEN:  N/A  COUNTS:  YES  TOURNIQUET:  * No tourniquets in log *  DICTATION: .Other Dictation: Dictation Number [id number]  PLAN OF CARE: Admit to inpatient   PATIENT DISPOSITION:  PACU - hemodynamically stable.   Delay start of Pharmacological VTE agent (>24hrs) due to surgical blood loss or risk of bleeding: no

## 2013-12-08 NOTE — Anesthesia Preprocedure Evaluation (Signed)
Anesthesia Evaluation  Patient identified by MRN, date of birth, ID band Patient awake    Reviewed: Allergy & Precautions, H&P , NPO status , Patient's Chart, lab work & pertinent test results, reviewed documented beta blocker date and time   Airway Mallampati: II TM Distance: >3 FB Neck ROM: full    Dental   Pulmonary sleep apnea , Current Smoker,  breath sounds clear to auscultation        Cardiovascular hypertension, On Medications Rhythm:regular     Neuro/Psych negative neurological ROS  negative psych ROS   GI/Hepatic Neg liver ROS, GERD-  Medicated and Controlled,  Endo/Other  negative endocrine ROS  Renal/GU negative Renal ROS  negative genitourinary   Musculoskeletal   Abdominal   Peds  Hematology negative hematology ROS (+)   Anesthesia Other Findings See surgeon's H&P   Reproductive/Obstetrics negative OB ROS                           Anesthesia Physical Anesthesia Plan  ASA: II  Anesthesia Plan: General   Post-op Pain Management:    Induction: Intravenous  Airway Management Planned: LMA  Additional Equipment:   Intra-op Plan:   Post-operative Plan:   Informed Consent: I have reviewed the patients History and Physical, chart, labs and discussed the procedure including the risks, benefits and alternatives for the proposed anesthesia with the patient or authorized representative who has indicated his/her understanding and acceptance.   Dental Advisory Given  Plan Discussed with: CRNA and Surgeon  Anesthesia Plan Comments:         Anesthesia Quick Evaluation

## 2013-12-08 NOTE — Progress Notes (Signed)
PT Cancel Note:    Pt off floor for surgery.  Repeat I&D of Rt leg.    Roger Ayers, [place] [phone] 12/08/2013

## 2013-12-08 NOTE — Transfer of Care (Signed)
Immediate Anesthesia Transfer of Care Note  Patient: Roger Ayers  Procedure(s) Performed: Procedure(s): Repeat IRRIGATION AND DEBRIDEMENT Right Leg with Stimulan bead placement (Right)  Patient Location: PACU  Anesthesia Type:General  Level of Consciousness: awake, alert , patient cooperative and responds to stimulation  Airway & Oxygen Therapy: Patient Spontanous Breathing and Patient connected to nasal cannula oxygen  Post-op Assessment: Report given to PACU RN and Post -op Vital signs reviewed and stable  Post vital signs: Reviewed and stable  Complications: No apparent anesthesia complications

## 2013-12-08 NOTE — Anesthesia Postprocedure Evaluation (Signed)
Anesthesia Post Note  Patient: Roger Ayers  Procedure(s) Performed: Procedure(s) (LRB): Repeat IRRIGATION AND DEBRIDEMENT Right Leg with Stimulan bead placement (Right)  Anesthesia type: General  Patient location: PACU  Post pain: Pain level controlled  Post assessment: Patient's Cardiovascular Status Stable  Last Vitals:  Filed Vitals:   12/08/13 1100  BP: 149/91  Pulse: 88  Temp: 37.1 C  Resp: 16    Post vital signs: Reviewed and stable  Level of consciousness: alert  Complications: No apparent anesthesia complications

## 2013-12-09 LAB — CBC
HCT: 29.2 % — ABNORMAL LOW (ref 39.0–52.0)
Hemoglobin: 9.7 g/dL — ABNORMAL LOW (ref 13.0–17.0)
MCV: 94.2 fL (ref 78.0–100.0)
RBC: 3.1 MIL/uL — ABNORMAL LOW (ref 4.22–5.81)
WBC: 11.8 10*3/uL — ABNORMAL HIGH (ref 4.0–10.5)

## 2013-12-09 LAB — VANCOMYCIN, TROUGH: Vancomycin Tr: 6.5 ug/mL — ABNORMAL LOW (ref 10.0–20.0)

## 2013-12-09 MED ORDER — VANCOMYCIN HCL 10 G IV SOLR
1500.0000 mg | Freq: Three times a day (TID) | INTRAVENOUS | Status: DC
  Administered 2013-12-09 – 2013-12-10 (×3): 1500 mg via INTRAVENOUS
  Filled 2013-12-09 (×6): qty 1500

## 2013-12-09 MED ORDER — GABAPENTIN 100 MG PO CAPS
100.0000 mg | ORAL_CAPSULE | Freq: Two times a day (BID) | ORAL | Status: DC
  Administered 2013-12-09 – 2013-12-10 (×3): 100 mg via ORAL
  Filled 2013-12-09 (×4): qty 1

## 2013-12-09 NOTE — Progress Notes (Signed)
ANTIBIOTIC CONSULT NOTE - FOLLOW UP  Pharmacy Consult for Vancomycin  Indication: R-leg wound infection   Allergies  Allergen Reactions  . Thimerosal     Back when he was in high school (dicontinued med)    Patient Measurements: Height: 5\' 11"  (180.3 cm) Weight: 260 lb (117.935 kg) IBW/kg (Calculated) : 75.3  Vital Signs: Temp: 99.1 F (37.3 C) (12/14 0500) BP: 126/68 mmHg (12/14 0500) Pulse Rate: 81 (12/14 0500) Intake/Output from previous day: 12/13 0701 - 12/14 0700 In: 3076 [P.O.:1160; I.V.:1516; IV Piggyback:400] Out: 1220 [Urine:1220] Intake/Output from this shift: Total I/O In: 340 [P.O.:320; I.V.:20] Out: 400 [Urine:400]  Labs:  Recent Labs  12/07/13 0613 12/07/13 1703 12/08/13 0330 12/09/13 0500  WBC 11.5* 12.3* 11.5* 11.8*  HGB 10.1* 10.8* 10.8* 9.7*  PLT 414* 437* 447* 453*  CREATININE 0.99  --  0.89  --    Estimated Creatinine Clearance: 134 ml/min (by C-G formula based on Cr of 0.89).  Recent Labs  12/08/13 0330 12/09/13 1245  VANCOTROUGH <5.0* 6.5*     Microbiology: Recent Results (from the past 720 hour(s))  SURGICAL PCR SCREEN     Status: Abnormal   Collection Time    11/22/13  4:18 PM      Result Value Range Status   MRSA, PCR NEGATIVE  NEGATIVE Final   Staphylococcus aureus POSITIVE (*) NEGATIVE Final   Comment:            The Xpert SA Assay (FDA     approved for NASAL specimens     in patients over 28 years of age),     is one component of     a comprehensive surveillance     program.  Test performance has     been validated by [employer] for patients greater     than or equal to 49 year old.     It is not intended     to diagnose infection nor to     guide or monitor treatment.  ANAEROBIC CULTURE     Status: None   Collection Time    12/05/13  4:23 PM      Result Value Range Status   Specimen Description WOUND RIGHT LOWER LEG   Final   Special Requests NONE   Final   Gram Stain     Final   Value: NO WBC SEEN   NO SQUAMOUS EPITHELIAL CELLS SEEN     ABUNDANT GRAM POSITIVE COCCI IN PAIRS     Performed at [facility]   Culture     Final   Value: NO ANAEROBES ISOLATED; CULTURE IN PROGRESS FOR 5 DAYS     Performed at [facility]   Report Status PENDING   Incomplete  WOUND CULTURE     Status: None   Collection Time    12/05/13  4:23 PM      Result Value Range Status   Specimen Description WOUND RIGHT LOWER LEG   Final   Special Requests NONE   Final   Gram Stain     Final   Value: NO WBC SEEN     NO SQUAMOUS EPITHELIAL CELLS SEEN     ABUNDANT GRAM POSITIVE COCCI IN PAIRS     Performed at [facility]   Culture     Final   Value: ABUNDANT METHICILLIN RESISTANT STAPHYLOCOCCUS AUREUS     Note: RIFAMPIN AND GENTAMICIN SHOULD NOT BE USED AS SINGLE DRUGS FOR TREATMENT OF  STAPH INFECTIONS. This organism DOES NOT demonstrate inducible Clindamycin resistance in vitro. CRITICAL RESULT CALLED TO, READ BACK BY AND VERIFIED WITH: WILLIE C 12/13      @900  BY REAMM     Performed at [facility]   Report Status 12/08/2013 FINAL   Final   Organism ID, Bacteria METHICILLIN RESISTANT STAPHYLOCOCCUS AUREUS   Final    Anti-infectives   Start     Dose/Rate Route Frequency Ordered Stop   12/09/13 1430  vancomycin (VANCOCIN) 1,500 mg in sodium chloride 0.9 % 500 mL IVPB     1,500 mg 250 mL/hr over 120 Minutes Intravenous Every 8 hours 12/09/13 1354     12/08/13 1200  vancomycin (VANCOCIN) IVPB 1000 mg/200 mL premix  Status:  Discontinued     1,000 mg 200 mL/hr over 60 Minutes Intravenous Every 8 hours 12/08/13 0509 12/09/13 1354   12/08/13 0946  vancomycin (VANCOCIN) powder  Status:  Discontinued       As needed 12/08/13 0947 12/08/13 1018   12/08/13 0945  tobramycin (NEBCIN) powder  Status:  Discontinued       As needed 12/08/13 0946 12/08/13 1018   12/08/13 0930  polymyxin B 500,000 Units, bacitracin 50,000 Units in sodium chloride irrigation 0.9 % 500 mL irrigation  Status:   Discontinued       As needed 12/08/13 0930 12/08/13 1018   12/07/13 1700  rifampin (RIFADIN) capsule 300 mg     300 mg Oral Daily 12/07/13 1608     12/06/13 0400  vancomycin (VANCOCIN) IVPB 1000 mg/200 mL premix  Status:  Discontinued     1,000 mg 200 mL/hr over 60 Minutes Intravenous Every 12 hours 12/05/13 2018 12/08/13 0509   12/05/13 2130  piperacillin-tazobactam (ZOSYN) IVPB 3.375 g  Status:  Discontinued     3.375 g 12.5 mL/hr over 240 Minutes Intravenous Every 8 hours 12/05/13 2018 12/07/13 1608   12/05/13 1600  vancomycin (VANCOCIN) IVPB 1000 mg/200 mL premix  Status:  Discontinued     1,000 mg 200 mL/hr over 60 Minutes Intravenous  Once 12/05/13 1548 12/05/13 1831   12/05/13 1544  vancomycin (VANCOCIN) 1 GM/200ML IVPB    Comments:  Merril Abbe   : cabinet override      12/05/13 1544 12/05/13 1621      Assessment: 47 y/o M with MRSA r-leg wound infection. On vancomycin per pharmacy. S/p I&D on 12/10 and again on 12/13. Vancomycin trough was drawn late but true trough still low at 7.4. Other labs as above.   Goal of Therapy:  Vancomycin trough level 15-20 mcg/ml  Plan:  - Change vancomycin to 1500 mg IV q8h - Recheck vancomycin trough with 4th dose - Monitor renal function, temp, WBC, clinical status  Thank you for allowing me to take part in this patient's care,  Margie Billet, PharmD Clinical Pharmacist - Resident Pager: [phone] Pharmacy: [phone] 12/09/2013 2:00 PM

## 2013-12-09 NOTE — Progress Notes (Signed)
Physical Therapy Treatment Patient Details Name: Roger Ayers MRN: [id number] DOB: May 01, 1966 Today's Date: 12/09/2013 Time: 7829-5621 PT Time Calculation (min): 16 min  PT Assessment / Plan / Recommendation  History of Present Illness     PT Comments   Rx limited today due to pain from procedure yesterday.  Follow Up Recommendations  Home health PT     Does the patient have the potential to tolerate intense rehabilitation     Barriers to Discharge        Equipment Recommendations  None recommended by PT    Recommendations for Other Services    Frequency Min 5X/week   Progress towards PT Goals Progress towards PT goals: Progressing toward goals  Plan Current plan remains appropriate    Precautions / Restrictions Precautions Precautions: Fall Required Braces or Orthoses: Other Brace/Splint Other Brace/Splint: bledsoe brace RLE Restrictions Weight Bearing Restrictions: Yes RLE Weight Bearing: Non weight bearing   Pertinent Vitals/Pain 6/10    Mobility  Bed Mobility Supine to Sit: 4: Min assist;HOB elevated;With rails Sitting - Scoot to Edge of Bed: 4: Min assist;With rail Details for Bed Mobility Assistance: assist for RLE Transfers Sit to Stand: 4: Min guard;From bed;With upper extremity assist Stand to Sit: 4: Min guard;To chair/3-in-1;With armrests Stand Pivot Transfers: 4: Min guard (with RW) Details for Transfer Assistance: verbal cues for sequencing, safety    Exercises     PT Diagnosis:    PT Problem List:   PT Treatment Interventions:     PT Goals (current goals can now be found in the care plan section)    Visit Information  Last PT Received On: 12/09/13 Assistance Needed: +1    Subjective Data      Cognition  Cognition Arousal/Alertness: Awake/alert Behavior During Therapy: WFL for tasks assessed/performed Overall Cognitive Status: Within Functional Limits for tasks assessed    Balance     End of Session PT - End of  Session Equipment Utilized During Treatment: Gait belt Activity Tolerance: Patient limited by pain Patient left: in chair;with call bell/phone within reach   GP     Ilda Foil 12/09/2013, 11:19 AM  Aida Raider, PT  Office # [phone] Pager [phone]

## 2013-12-09 NOTE — Progress Notes (Signed)
Patient ID: Roger Ayers, male   DOB: 11-Dec-1966, 47 y.o.   MRN: [id number] On return to OR yesterday found more purulence.  Repeat I&D and antibiotic beads place deeply.  WBC 11.8 today.  Did grow out MRSA.  On Vanc and Rifampin.  If WBC trends down, may d/c to home in the next 1-2 days.

## 2013-12-10 LAB — BASIC METABOLIC PANEL
CO2: 27 mEq/L (ref 19–32)
Chloride: 97 mEq/L (ref 96–112)
Potassium: 4.1 mEq/L (ref 3.5–5.1)
Sodium: 134 mEq/L — ABNORMAL LOW (ref 135–145)

## 2013-12-10 LAB — CBC
MCV: 93.9 fL (ref 78.0–100.0)
Platelets: 512 10*3/uL — ABNORMAL HIGH (ref 150–400)
RBC: 3.28 MIL/uL — ABNORMAL LOW (ref 4.22–5.81)
WBC: 10.9 10*3/uL — ABNORMAL HIGH (ref 4.0–10.5)

## 2013-12-10 LAB — ANAEROBIC CULTURE: Gram Stain: NONE SEEN

## 2013-12-10 MED ORDER — RIFAMPIN 300 MG PO CAPS: 300.0000 mg | ORAL_CAPSULE | Freq: Two times a day (BID) | ORAL | Status: DC

## 2013-12-10 MED ORDER — HEPARIN SOD (PORK) LOCK FLUSH 100 UNIT/ML IV SOLN: 250.0000 [IU] | INTRAVENOUS | Status: DC | PRN

## 2013-12-10 MED ORDER — METHOCARBAMOL 500 MG PO TABS: 500.0000 mg | ORAL_TABLET | Freq: Four times a day (QID) | ORAL | Status: DC | PRN

## 2013-12-10 MED ORDER — OXYCODONE HCL ER 40 MG PO T12A: 40.0000 mg | EXTENDED_RELEASE_TABLET | Freq: Two times a day (BID) | ORAL | Status: DC

## 2013-12-10 MED ORDER — HYDROMORPHONE HCL 2 MG PO TABS: 2.0000 mg | ORAL_TABLET | ORAL | Status: DC | PRN

## 2013-12-10 MED ORDER — GABAPENTIN 100 MG PO CAPS: 100.0000 mg | ORAL_CAPSULE | Freq: Two times a day (BID) | ORAL | Status: DC

## 2013-12-10 MED ORDER — VANCOMYCIN HCL 10 G IV SOLR: 1500.0000 mg | Freq: Three times a day (TID) | INTRAVENOUS | Status: DC

## 2013-12-10 NOTE — Progress Notes (Signed)
Patient ID: Roger Ayers, male   DOB: February 02, 1966, 47 y.o.   MRN: [id number] WBC down and wound looks much better overall.  Will discharge to home with home health for PICC line care and IV antibiotic management.

## 2013-12-10 NOTE — Progress Notes (Signed)
ANTIBIOTIC CONSULT NOTE - FOLLOW UP  Pharmacy Consult for Vancomycin  Indication: R-leg wound infection   Allergies  Allergen Reactions  . Thimerosal     Back when he was in high school (dicontinued med)    Patient Measurements: Height: 5\' 11"  (180.3 cm) Weight: 260 lb (117.935 kg) IBW/kg (Calculated) : 75.3  Vital Signs: Temp: 98.3 F (36.8 C) (12/15 0538) Temp src: Oral (12/15 0532) BP: 131/57 mmHg (12/15 0538) Pulse Rate: 76 (12/15 0538) Intake/Output from previous day: 12/14 0701 - 12/15 0700 In: 2784 [P.O.:1680; I.V.:604; IV Piggyback:500] Out: 2840 [Urine:2840] Intake/Output from this shift: Total I/O In: 480 [P.O.:480] Out: -   Labs:  Recent Labs  12/08/13 0330 12/09/13 0500 12/10/13 0434  WBC 11.5* 11.8* 10.9*  HGB 10.8* 9.7* 10.3*  PLT 447* 453* 512*  CREATININE 0.89  --  0.80   Estimated Creatinine Clearance: 149 ml/min (by C-G formula based on Cr of 0.8).  Recent Labs  12/08/13 0330 12/09/13 1245  VANCOTROUGH <5.0* 6.5*     Microbiology: Recent Results (from the past 720 hour(s))  SURGICAL PCR SCREEN     Status: Abnormal   Collection Time    11/22/13  4:18 PM      Result Value Range Status   MRSA, PCR NEGATIVE  NEGATIVE Final   Staphylococcus aureus POSITIVE (*) NEGATIVE Final   Comment:            The Xpert SA Assay (FDA     approved for NASAL specimens     in patients over 60 years of age),     is one component of     a comprehensive surveillance     program.  Test performance has     been validated by [employer] for patients greater     than or equal to 66 year old.     It is not intended     to diagnose infection nor to     guide or monitor treatment.  ANAEROBIC CULTURE     Status: None   Collection Time    12/05/13  4:23 PM      Result Value Range Status   Specimen Description WOUND RIGHT LOWER LEG   Final   Special Requests NONE   Final   Gram Stain     Final   Value: NO WBC SEEN     NO SQUAMOUS EPITHELIAL CELLS  SEEN     ABUNDANT GRAM POSITIVE COCCI IN PAIRS     Performed at [facility]   Culture     Final   Value: NO ANAEROBES ISOLATED; CULTURE IN PROGRESS FOR 5 DAYS     Performed at [facility]   Report Status PENDING   Incomplete  WOUND CULTURE     Status: None   Collection Time    12/05/13  4:23 PM      Result Value Range Status   Specimen Description WOUND RIGHT LOWER LEG   Final   Special Requests NONE   Final   Gram Stain     Final   Value: NO WBC SEEN     NO SQUAMOUS EPITHELIAL CELLS SEEN     ABUNDANT GRAM POSITIVE COCCI IN PAIRS     Performed at [facility]   Culture     Final   Value: ABUNDANT METHICILLIN RESISTANT STAPHYLOCOCCUS AUREUS     Note: RIFAMPIN AND GENTAMICIN SHOULD NOT BE USED AS SINGLE DRUGS FOR TREATMENT OF STAPH  INFECTIONS. This organism DOES NOT demonstrate inducible Clindamycin resistance in vitro. CRITICAL RESULT CALLED TO, READ BACK BY AND VERIFIED WITH: WILLIE C 12/13      @900  BY REAMM     Performed at [facility]   Report Status 12/08/2013 FINAL   Final   Organism ID, Bacteria METHICILLIN RESISTANT STAPHYLOCOCCUS AUREUS   Final    Anti-infectives   Start     Dose/Rate Route Frequency Ordered Stop   12/10/13 0000  rifampin (RIFADIN) 300 MG capsule     300 mg Oral 2 times daily 12/10/13 0739     12/10/13 0000  sodium chloride 0.9 % SOLN 500 mL with vancomycin 10 G SOLR 1,500 mg     1,500 mg 250 mL/hr over 120 Minutes Intravenous Every 8 hours 12/10/13 0739     12/09/13 1430  vancomycin (VANCOCIN) 1,500 mg in sodium chloride 0.9 % 500 mL IVPB     1,500 mg 250 mL/hr over 120 Minutes Intravenous Every 8 hours 12/09/13 1354     12/08/13 1200  vancomycin (VANCOCIN) IVPB 1000 mg/200 mL premix  Status:  Discontinued     1,000 mg 200 mL/hr over 60 Minutes Intravenous Every 8 hours 12/08/13 0509 12/09/13 1354   12/08/13 0946  vancomycin (VANCOCIN) powder  Status:  Discontinued       As needed 12/08/13 0947 12/08/13 1018    12/08/13 0945  tobramycin (NEBCIN) powder  Status:  Discontinued       As needed 12/08/13 0946 12/08/13 1018   12/08/13 0930  polymyxin B 500,000 Units, bacitracin 50,000 Units in sodium chloride irrigation 0.9 % 500 mL irrigation  Status:  Discontinued       As needed 12/08/13 0930 12/08/13 1018   12/07/13 1700  rifampin (RIFADIN) capsule 300 mg     300 mg Oral Daily 12/07/13 1608     12/06/13 0400  vancomycin (VANCOCIN) IVPB 1000 mg/200 mL premix  Status:  Discontinued     1,000 mg 200 mL/hr over 60 Minutes Intravenous Every 12 hours 12/05/13 2018 12/08/13 0509   12/05/13 2130  piperacillin-tazobactam (ZOSYN) IVPB 3.375 g  Status:  Discontinued     3.375 g 12.5 mL/hr over 240 Minutes Intravenous Every 8 hours 12/05/13 2018 12/07/13 1608   12/05/13 1600  vancomycin (VANCOCIN) IVPB 1000 mg/200 mL premix  Status:  Discontinued     1,000 mg 200 mL/hr over 60 Minutes Intravenous  Once 12/05/13 1548 12/05/13 1831   12/05/13 1544  vancomycin (VANCOCIN) 1 GM/200ML IVPB    Comments:  Merril Abbe   : cabinet override      12/05/13 1544 12/05/13 1621      Assessment: 47 y/o M with MRSA r-leg wound infection. On vancomycin per pharmacy. S/p I&D on 12/10 and again on 12/13. Vanc trough was a low. Plan was to recheck trough today. If pt get dced before trough then CM please follow dose recommendation below. I want to switch to q12 dosing for ease of outpt administration.   Goal of Therapy:  Vancomycin trough level 15-20 mcg/ml  Plan:   Cont vanc 1.5g IV q8 while here  IF dc home before trough drawn, CM please set pt up for VANCOMYCIN 1.75g IV Q12H TELL HOME CARE TO CHECK TROUGH IN A FEW DAYS.

## 2013-12-10 NOTE — Progress Notes (Signed)
Pt continues to rate his pain at a seven or higher despite frequent pain meds. He rests well after receiving IV Dilaudid 2mg . Is trying to manage his pain with just po meds, but his pain during the night required one dose of IV dilaudid. Pt complains of an ache and burning sensation along his tibia. Is very discouraged by his situation and his ongoing pain.

## 2013-12-10 NOTE — Discharge Summary (Signed)
Patient ID: Roger Ayers MRN: [id number] DOB/AGE: 31-Oct-1966 47 y.o.  Admit date: 12/05/2013 Discharge date: 12/10/2013  Admission Diagnoses:  Principal Problem:   Infection of right tibia post ORIF tibial plateau   Discharge Diagnoses:  Same  Past Medical History  Diagnosis Date  . Hypertension   . GERD (gastroesophageal reflux disease)   . Sleep apnea     cpap    Surgeries: Procedure(s): Repeat IRRIGATION AND DEBRIDEMENT Right Leg with Stimulan bead placement on 12/05/2013 - 12/08/2013   Consultants:    Discharged Condition: Improved  Hospital Course: Roger Ayers is an 47 y.o. male who was admitted 12/05/2013 for operative treatment ofInfection of tibia. Patient has severe unremitting pain that affects sleep, daily activities, and work/hobbies. After pre-op clearance the patient was taken to the operating room on 12/05/2013 - 12/08/2013 and underwent  Procedure(s): Repeat IRRIGATION AND DEBRIDEMENT Right Leg with Stimulan bead placement.    Patient was given perioperative antibiotics: Anti-infectives   Start     Dose/Rate Route Frequency Ordered Stop   12/10/13 0000  rifampin (RIFADIN) 300 MG capsule     300 mg Oral 2 times daily 12/10/13 0739     12/10/13 0000  sodium chloride 0.9 % SOLN 500 mL with vancomycin 10 G SOLR 1,500 mg     1,500 mg 250 mL/hr over 120 Minutes Intravenous Every 8 hours 12/10/13 0739     12/09/13 1430  vancomycin (VANCOCIN) 1,500 mg in sodium chloride 0.9 % 500 mL IVPB     1,500 mg 250 mL/hr over 120 Minutes Intravenous Every 8 hours 12/09/13 1354     12/08/13 1200  vancomycin (VANCOCIN) IVPB 1000 mg/200 mL premix  Status:  Discontinued     1,000 mg 200 mL/hr over 60 Minutes Intravenous Every 8 hours 12/08/13 0509 12/09/13 1354   12/08/13 0946  vancomycin (VANCOCIN) powder  Status:  Discontinued       As needed 12/08/13 0947 12/08/13 1018   12/08/13 0945  tobramycin (NEBCIN) powder  Status:  Discontinued       As needed 12/08/13 0946  12/08/13 1018   12/08/13 0930  polymyxin B 500,000 Units, bacitracin 50,000 Units in sodium chloride irrigation 0.9 % 500 mL irrigation  Status:  Discontinued       As needed 12/08/13 0930 12/08/13 1018   12/07/13 1700  rifampin (RIFADIN) capsule 300 mg     300 mg Oral Daily 12/07/13 1608     12/06/13 0400  vancomycin (VANCOCIN) IVPB 1000 mg/200 mL premix  Status:  Discontinued     1,000 mg 200 mL/hr over 60 Minutes Intravenous Every 12 hours 12/05/13 2018 12/08/13 0509   12/05/13 2130  piperacillin-tazobactam (ZOSYN) IVPB 3.375 g  Status:  Discontinued     3.375 g 12.5 mL/hr over 240 Minutes Intravenous Every 8 hours 12/05/13 2018 12/07/13 1608   12/05/13 1600  vancomycin (VANCOCIN) IVPB 1000 mg/200 mL premix  Status:  Discontinued     1,000 mg 200 mL/hr over 60 Minutes Intravenous  Once 12/05/13 1548 12/05/13 1831   12/05/13 1544  vancomycin (VANCOCIN) 1 GM/200ML IVPB    Comments:  Merril Abbe   : cabinet override      12/05/13 1544 12/05/13 1621       Patient was given sequential compression devices, early ambulation, and chemoprophylaxis to prevent DVT.  Patient benefited maximally from hospital stay and there were no complications.    Recent vital signs: Patient Vitals for the past 24 hrs:  BP Temp Temp src Pulse  Resp SpO2  12/10/13 0538 131/57 mmHg 98.3 F (36.8 C) - 76 18 99 %  12/10/13 0532 145/79 mmHg 98.6 F (37 C) Oral 67 18 98 %  12/09/13 2131 108/44 mmHg 98.5 F (36.9 C) - 80 19 99 %  12/09/13 1500 123/71 mmHg 98.5 F (36.9 C) Oral 72 18 95 %     Recent laboratory studies:  Recent Labs  12/08/13 0330 12/09/13 0500 12/10/13 0434  WBC 11.5* 11.8* 10.9*  HGB 10.8* 9.7* 10.3*  HCT 31.8* 29.2* 30.8*  PLT 447* 453* 512*  NA 134*  --  134*  K 4.0  --  4.1  CL 96  --  97  CO2 28  --  27  BUN 12  --  8  CREATININE 0.89  --  0.80  GLUCOSE 108*  --  106*  CALCIUM 8.5  --  8.8     Discharge Medications:     Medication List    STOP taking these  medications       oxyCODONE 5 MG immediate release tablet  Commonly known as:  Oxy IR/ROXICODONE  Replaced by:  OxyCODONE 40 mg T12a 12 hr tablet      TAKE these medications       acetaminophen 500 MG tablet  Commonly known as:  TYLENOL  Take 500 mg by mouth every 6 (six) hours as needed.     amLODipine 5 MG tablet  Commonly known as:  NORVASC  Take 5 mg by mouth daily.     aspirin 325 MG EC tablet  Take 1 tablet (325 mg total) by mouth 2 (two) times daily after a meal.     gabapentin 100 MG capsule  Commonly known as:  NEURONTIN  Take 1 capsule (100 mg total) by mouth 2 (two) times daily.     HYDROmorphone 2 MG tablet  Commonly known as:  DILAUDID  Take 1 tablet (2 mg total) by mouth every 4 (four) hours as needed for severe pain.     lisinopril 5 MG tablet  Commonly known as:  PRINIVIL,ZESTRIL  Take 5 mg by mouth every morning.     methocarbamol 500 MG tablet  Commonly known as:  ROBAXIN  Take 1 tablet (500 mg total) by mouth every 6 (six) hours as needed for muscle spasms.     methocarbamol 500 MG tablet  Commonly known as:  ROBAXIN  Take 1 tablet (500 mg total) by mouth every 6 (six) hours as needed for muscle spasms.     multivitamin with minerals Tabs tablet  Take 1 tablet by mouth daily.     omeprazole 20 MG capsule  Commonly known as:  PRILOSEC  Take 20 mg by mouth every morning.     OxyCODONE 40 mg T12a 12 hr tablet  Commonly known as:  OXYCONTIN  Take 1 tablet (40 mg total) by mouth every 12 (twelve) hours.     rifampin 300 MG capsule  Commonly known as:  RIFADIN  Take 1 capsule (300 mg total) by mouth 2 (two) times daily.     sodium chloride 0.9 % SOLN 500 mL with vancomycin 10 G SOLR 1,500 mg  Inject 1,500 mg into the vein every 8 (eight) hours.        Diagnostic Studies: Dg Chest 2 View  12/05/2013   CLINICAL DATA:  Preoperative exam prior to lower extremity incision and drainage. Fever, smoker. Hypertension history.  EXAM: CHEST  2 VIEW   COMPARISON:  11/17/2013  FINDINGS: The heart  size and mediastinal contours are within normal limits. Both lungs are clear. The visualized skeletal structures are unremarkable.  IMPRESSION: No active cardiopulmonary disease.   Electronically Signed   By: Christiana Pellant M.D.   On: 12/05/2013 15:18   Dg Femur Right  11/17/2013   CLINICAL DATA:  External fixator placement  EXAM: RIGHT FEMUR - 2 VIEW  COMPARISON:  11/17/2013  FINDINGS: Multiple intraoperative spot images are submitted demonstrating placement of external fixator device across the right knee and tibial plateau fractures. No hardware or bony complicating feature.  IMPRESSION: External fixator placement across the right knee.   Electronically Signed   By: Charlett Nose M.D.   On: 11/17/2013 19:36   Dg Tibia/fibula Right  11/23/2013   CLINICAL DATA:  Right tibial plateau fracture  EXAM: RIGHT TIBIA AND FIBULA - 2 VIEW  COMPARISON:  Right knee radiographs - 11/17/2013; right knee CT -11/17/2013  FLUOROSCOPY TIME:  3 min, 4 seconds  FINDINGS: Four spot intraoperative radiographic images of the right knee are provided for review.  Images demonstrate the sequela of sideplate fixation of previously noted comminuted and displaced fracture of the tibial plateau. Alignment now appears near anatomic.  Grossly unchanged appearance of minimally displaced fracture involving the proximal fibular head.  There is a minimal amount of expected subcutaneous emphysema and intra-articular air about the operative site. No radiopaque foreign body.  IMPRESSION: 1. Post ORIF of comminuted, displaced tibial plateau fracture without evidence complication. 2. Unchanged appearance of the minimally displace fracture involving the proximal fibula.   Electronically Signed   By: Simonne Come M.D.   On: 11/23/2013 12:49   Dg Tibia/fibula Right  11/17/2013   CLINICAL DATA:  External fixator.  Tibial plateau fracture.  EXAM: RIGHT TIBIA AND FIBULA - 2 VIEW; DG C-ARM 1-60 MIN - NRPT  [facility]  COMPARISON:  Plain films and CT 11/17/2013  FINDINGS: Multiple intraoperative spot images are submitted demonstrating placement of external fixator device across the right knee and tibial plateau fractures. No hardware or bony complicating feature.  IMPRESSION: External fixator placement across the right knee.   Electronically Signed   By: Charlett Nose M.D.   On: 11/17/2013 19:35   Ct Knee Right Wo Contrast  11/17/2013   CLINICAL DATA:  Right knee tibial plateau fracture  EXAM: CT OF THE RIGHT KNEE WITHOUT CONTRAST  TECHNIQUE: Multidetector CT imaging was performed according to the standard protocol. Multiplanar CT image reconstructions were also generated.  COMPARISON:  Radiographic series dated 11/17/2013  FINDINGS: A comminuted fracture is identified involving the lateral tibial plateau. Areas of distraction appreciated maximal diameter of 3.5 cm. There are areas of depression in the posterior aspect of fracture remaining tooth of approximately 1 cm. The fracture extends into the tibial spine region as well as into the medial tibial plateau region. The medial tibial plateau fracture is also comminuted and demonstrates minimal depression of 2-3 mm. There is a vertical component extending into the proximal tibial shaft. An impacted comminuted fibular head fracture is appreciated. There does not appear to be significant displacement.  IMPRESSION: 1. Lateral and medial tibial plateau fractures with distraction and areas of depression. 2. Comminuted fibular head fracture.   Electronically Signed   By: Salome Holmes M.D.   On: 11/17/2013 16:16   Ir Fluoro Guide Cv Line Right  12/07/2013   CLINICAL DATA:  Trauma, extremity fracture, access for IV antibiotics  EXAM: Right upper extremity PICC LINE exchange WITH FLUOROSCOPIC GUIDANCE  FLUOROSCOPY TIME:  12  seconds  PROCEDURE: The patient was advised of the possible risks and complications and agreed to undergo the procedure. The patient was then brought to  the angiographic suite for the procedure.  The right arm was prepped with chlorhexidine, draped in the usual sterile fashion using maximum barrier technique (cap and mask, sterile gown, sterile gloves, large sterile sheet, hand hygiene and cutaneous antisepsis) and infiltrated locally with 1% Lidocaine.  Under sterile conditions and local anesthesia, the existing malpositioned right PICC line was cut and removed over a guidewire. A new PICC line was advanced to the lower SVC/right atrial junction. Fluoroscopy during the procedure and fluoro spot radiograph confirms appropriate catheter position. The catheter was flushed and covered with a sterile dressing.  Complications: No immediate  IMPRESSION: Successful right upper extremity PICC line replacement. The catheter is ready for use.   Electronically Signed   By: Ruel Favors M.D.   On: 12/07/2013 09:32   Ct 3d Recon At Scanner  11/17/2013   CLINICAL DATA:  Right tibial plateau fractures  EXAM: 3-DIMENSIONAL CT IMAGE RENDERING ON ACQUISITION WORKSTATION  TECHNIQUE: 3-dimensional CT images were rendered by post-processing of the original CT data on an acquisition workstation. The 3-dimensional CT images were interpreted and findings were reported in the accompanying complete CT report for this study  COMPARISON:  Plain films and CT 11/17/2013  FINDINGS: 3D reconstructed images were performed of the right knee and again demonstrate the comminuted medial and lateral tibial plateau fractures as well as fibular head fractures.  IMPRESSION: Again noted are the comminuted medial and lateral tibial plateau fractures and proximal fibular fracture.   Electronically Signed   By: Charlett Nose M.D.   On: 11/17/2013 18:19   Dg Chest Port 1 View  12/06/2013   CLINICAL DATA:  PICC placement  EXAM: PORTABLE CHEST - 1 VIEW  COMPARISON:  12/05/2013  FINDINGS: Right PICC extends cephalad into the right internal jugular vein and above the included field of view.  Heart,  mediastinum and hila are unremarkable. The lungs are clear. No pleural effusion or pneumothorax.  IMPRESSION: Right PICC tip extends superiorly and to the right internal jugular vein.  No acute cardiopulmonary disease.   Electronically Signed   By: Amie Portland M.D.   On: 12/06/2013 21:16   Dg Chest Port 1 View  11/17/2013   CLINICAL DATA:  Hypertension.  EXAM: PORTABLE CHEST - 1 VIEW  COMPARISON:  None.  FINDINGS: The heart size and mediastinal contours are within normal limits. Both lungs are clear. The visualized skeletal structures are unremarkable.  IMPRESSION: No active disease.   Electronically Signed   By: Roque Lias M.D.   On: 11/17/2013 15:35   Dg Knee Complete 4 Views Right  11/17/2013   CLINICAL DATA:  History of trauma.  EXAM: RIGHT KNEE - COMPLETE 4+ VIEW  COMPARISON:  None.  FINDINGS: A comminuted lateral tibial plateau fracture identified demonstrating a next anterior/ is lateral displacement, 3 cm of distraction, mild depression, intra-articular extension is. The fracture line appears extending from the lateral tibial spine to the peripheral cortical base. An impacted comminuted fracture identified involving the proximal fibula.  IMPRESSION: 1. Lateral tibial plateau fracture 2. Comminuted impacted proximal fibular head fracture.   Electronically Signed   By: Salome Holmes M.D.   On: 11/17/2013 14:39   Dg Knee Right Port  11/23/2013   CLINICAL DATA:  Postop right knee surgery  EXAM: PORTABLE RIGHT KNEE - 1-2 VIEW  COMPARISON:  Portable AP and  lateral radiographs compared intraoperative images of 11/23/2013  FINDINGS: Lateral buttress plate and multiple screws are identified across reduced fracture of the lateral tibial plateau.  Significantly improved alignment of lateral plateau articular surface versus preoperative evaluation.  Fibular neck fracture identified.  Femur appears intact as does patella.  No additional focal bony abnormalities identified.  IMPRESSION: Markedly improved  alignment of fracture fragments at the lateral tibial plateau post ORIF.   Electronically Signed   By: Ulyses Southward M.D.   On: 11/23/2013 14:03   Dg Hand Complete Left  11/17/2013   CLINICAL DATA:  Fall.  EXAM: LEFT HAND - COMPLETE 3+ VIEW  COMPARISON:  None.  FINDINGS: There is no evidence of fracture or dislocation. There is no evidence of arthropathy or other focal bone abnormality. Soft tissues are unremarkable.  IMPRESSION: Negative.   Electronically Signed   By: Charlett Nose M.D.   On: 11/17/2013 16:31   Dg C-arm 1-60 Min-no Report  11/17/2013   CLINICAL DATA:  External fixator.  Tibial plateau fracture.  EXAM: RIGHT TIBIA AND FIBULA - 2 VIEW; DG C-ARM 1-60 MIN - NRPT [facility]  COMPARISON:  Plain films and CT 11/17/2013  FINDINGS: Multiple intraoperative spot images are submitted demonstrating placement of external fixator device across the right knee and tibial plateau fractures. No hardware or bony complicating feature.  IMPRESSION: External fixator placement across the right knee.   Electronically Signed   By: Charlett Nose M.D.   On: 11/17/2013 19:35   Dg C-arm 61-120 Min-no Report  11/23/2013   CLINICAL DATA: ORIF RIGHT TIBIA   C-ARM 61-120 MINUTES  Fluoroscopy was utilized by the requesting physician.  No radiographic  interpretation.     Disposition: 06-Home-Health Care Svc      Discharge Orders   Future Orders Complete By Expires   Call MD / Call 911  As directed    Comments:     If you experience chest pain or shortness of breath, CALL 911 and be transported to the hospital emergency room.  If you develope a fever above 101 F, pus (white drainage) or increased drainage or redness at the wound, or calf pain, call your surgeon's office.   Constipation Prevention  As directed    Comments:     Drink plenty of fluids.  Prune juice may be helpful.  You may use a stool softener, such as Colace (over the counter) 100 mg twice a day.  Use MiraLax (over the counter) for constipation as  needed.   Diet - low sodium heart healthy  As directed    Discharge instructions  As directed    Comments:     Leave current dressing in place. Call [facility] for an appointment this coming Thursday afternoon 12/18 Ice and elevation for swelling.   Discharge patient  As directed    Increase activity slowly as tolerated  As directed       Follow-up Information   Follow up with Kathryne Hitch, MD. Schedule an appointment as soon as possible for a visit in 3 days.   Specialty:  Orthopedic Surgery   Contact information:   [address] [place] [address] [phone]        Signed: Kathryne Hitch 12/10/2013, 7:39 AM

## 2013-12-11 ENCOUNTER — Encounter (HOSPITAL_COMMUNITY): Payer: Self-pay | Admitting: Orthopaedic Surgery

## 2013-12-11 NOTE — Op Note (Signed)
Roger Ayers, Roger Ayers             ACCOUNT NO.:  [id number]  MEDICAL RECORD NO.:  [id number]  LOCATION:                               FACILITY:  [facility]  PHYSICIAN:  Vanita Panda. Magnus Ivan, M.D.DATE OF BIRTH:  02/04/1966  DATE OF PROCEDURE:  12/08/2013 DATE OF DISCHARGE:                              OPERATIVE REPORT   PREOPERATIVE DIAGNOSIS:  Right leg postoperative infection, status post irrigation and debridement x1.  POSTOPERATIVE DIAGNOSIS:  Right leg postoperative infection, status post irrigation and debridement x1.  PROCEDURE: 1. Irrigation and debridement of the skin, soft tissue, deep tissue,     and muscle, right proximal to mid tibia. 2. Irrigation of right knee joint. 3. Placement of Stimulan antibiotic beads with vancomycin and     tobramycin.  FINDINGS:  Continued gross purulence in right proximal tibia and right knee.  SURGEON:  Vanita Panda. Magnus Ivan, M.D.  ASSISTANT:  Richardean Canal, PA-C.  ANESTHESIA:  General.  BLOOD LOSS:  Less than 100 mL.  COMPLICATIONS:  None.  INDICATIONS:  Roger Ayers is a 47 year old, who over 2 weeks ago now underwent open reduction and internal fixation of her right knee complex tibial plateau fracture.  He presented almost 2 weeks follow up with drainage from his wound.  We took him to the operating room that night and found gross purulence.  Cultures have been obtained, which today have grown out MRSA.  His white blood cell count peripherally has come down significantly, but due to continued pain, and the concern about needing to retain the hardware, we are proceeding for repeat irrigation debridement of both the leg and the knee joint itself.  Infectious Disease Service has been consulted and I agree with him being on vancomycin and rifampin.  The risks and benefits of the surgery were explained to him in detail and he does understand and want to proceed with surgery.  DESCRIPTION OF PROCEDURE:  After informed consent  was obtained, appropriate right leg was marked.  He was brought to the operating room, placed supine on the operating table.  General anesthesia was then obtained.  We took down the dressings from his right leg and there were still some gross purulence and draining.  We had the right leg prepped and draped with Betadine and sterile drapes.  Time-out was called to identify correct patient, correct right leg.  We then opened up the previous incision in the lateral aspect of his knee and did find persisting gross purulence.  Using a suction, we suctioned this out and then rongeured minimal necrotic tissue.  We then ran 6 L of normal saline solution all around the proximal tibia wound, the plate, the deep and superficial tissues and skin, as well as the most proximal external fixation pin site and the tibia.  Once I felt this was thoroughly clean, I then made 2 small incisions on the medial and lateral aspects of the knee joint itself and suctioned a large amount of fluid and hematoma in the knee joint.  We then ran an additional 3 L of normal saline solution through the knee joint itself.  We then had a mixture of Stimulan antibiotic beads with vancomycin and tobramycin.  We packed  deeply and superficially around the plate on the proximal tibia.  We then reapproximated the deep tissue with interrupted 0 PDS suture; followed by 2-0 PDS in the subcutaneous tissue, interrupted 2-0 nylon and all skin incisions.  Xeroform and a well-padded sterile dressing was applied.  He was awakened, extubated, and taken to recovery room in stable condition.  All final counts were correct and no complications noted.  Postoperatively, he will stay on vancomycin through a PICC line that was already in place with the goal to hopefully discharging him to home on long-term antibiotics at the beginning of the week.     Vanita Panda. Magnus Ivan, M.D.   ______________________________ Vanita Panda. Magnus Ivan,  M.D.    CYB/MEDQ  D:  12/08/2013  T:  12/09/2013  Job:  [id number]

## 2013-12-31 ENCOUNTER — Encounter: Payer: Self-pay | Admitting: Infectious Diseases

## 2013-12-31 ENCOUNTER — Encounter (INDEPENDENT_AMBULATORY_CARE_PROVIDER_SITE_OTHER): Payer: Self-pay

## 2013-12-31 ENCOUNTER — Telehealth: Payer: Self-pay | Admitting: *Deleted

## 2013-12-31 ENCOUNTER — Ambulatory Visit (INDEPENDENT_AMBULATORY_CARE_PROVIDER_SITE_OTHER): Payer: BC Managed Care – PPO | Admitting: Infectious Diseases

## 2013-12-31 VITALS — BP 140/82 | HR 125 | Temp 97.8°F | Ht 70.5 in | Wt 227.0 lb

## 2013-12-31 DIAGNOSIS — M869 Osteomyelitis, unspecified: Secondary | ICD-10-CM

## 2013-12-31 NOTE — Telephone Encounter (Signed)
Verbal order per Dr. Johnnye Sima given to [facility] with [place] to draw a sed rate, crp, and lft at his next nurse visit, and fax results to this clinic. Myrtis Hopping CMA

## 2013-12-31 NOTE — Progress Notes (Signed)
   Subjective:    Patient ID: Roger Ayers, male    DOB: [birth year], 48 y.o.   MRN: [id number]  HPI 48 yo M with hx falling 10' fall from a ladder and landing on his feet on 11-17-13. He developed severe R knee pain. He underwent repair with external fixation of R knee bicondylar tibial plateau fracture with severe comminution on 11-17-13. On 11-28 he underwent removal of fixator and had ORIF with plate, screws and bone chips as filler. He was d/c to home on 11-25-13.  He returned 12-10 with fever, pain and redness of his knee for 3 days as well as (bloody) drainage from his wound. He temps to 101 at home. In ED his temp was 99.9, WBC 24k and he was hyponatremic. He was taken to the OR and underwent I & D. He was started on vanco/zoyn.  His wound cx grew abundant MRSA. He was d/c home on vanco/rifampin on 12-10-13.  He still has some persistent d/c from his wound- yellowish. Is able to sit and put some pressure on his leg but is not able to stand and bear wt.  No problems with anbx or PIC line.  +night sweats.    Review of Systems  Constitutional: Positive for unexpected weight change. Negative for fever and chills.  Gastrointestinal: Negative for nausea, vomiting, diarrhea and constipation.  Genitourinary: Negative for difficulty urinating.  Skin: Negative for rash.      Objective:   Physical Exam  Constitutional: He appears well-developed and well-nourished.  Musculoskeletal:       Arms:      Legs:         Assessment & Plan:

## 2013-12-31 NOTE — Assessment & Plan Note (Signed)
Will f/u his ESR, CRP, LFTs. I am concerned about the healing of his leg. He suggests that the anbx beads are being resorbed and this is causing the drainage. This is certainly a possibility, hopefully the labs will confirm this. Will see him back in 17 days at the end of his therapy.

## 2014-01-09 ENCOUNTER — Telehealth: Payer: Self-pay | Admitting: *Deleted

## 2014-01-09 NOTE — Telephone Encounter (Signed)
Patient's picc line is clogged and insurance will not pay for cath flow in the home. Patient instructed to go to the ED to have this administered. Myrtis Hopping

## 2014-01-10 NOTE — Telephone Encounter (Signed)
Per [place] at [facility], Dr. Pryor Montes office gave an order to have this PICC pulled today. Landis Gandy, RN

## 2014-01-22 ENCOUNTER — Encounter: Payer: Self-pay | Admitting: Infectious Diseases

## 2014-01-22 ENCOUNTER — Ambulatory Visit (INDEPENDENT_AMBULATORY_CARE_PROVIDER_SITE_OTHER): Payer: BC Managed Care – PPO | Admitting: Infectious Diseases

## 2014-01-22 VITALS — BP 107/83 | HR 99 | Temp 97.8°F

## 2014-01-22 DIAGNOSIS — M869 Osteomyelitis, unspecified: Secondary | ICD-10-CM

## 2014-01-22 LAB — SEDIMENTATION RATE: Sed Rate: 13 mm/hr (ref 0–16)

## 2014-01-22 NOTE — Assessment & Plan Note (Signed)
He is doing well. Will continue him on the bactrim for another 3 months. Will check his ESR as well. Will see him back in 3 months.

## 2014-01-22 NOTE — Progress Notes (Signed)
   Subjective:    Patient ID: Roger Ayers, male    DOB: [birth year], 48 y.o.   MRN: [id number]  HPI 48 yo M with hx falling 10' fall from a ladder and landing on his feet on 11-17-13. He developed severe R knee pain. He underwent repair with external fixation of R knee bicondylar tibial plateau fracture with severe comminution on 11-17-13. On 11-28 he underwent removal of fixator and had ORIF with plate, screws and bone chips as filler. He was d/c to home on 11-25-13.  He returned 12-10 with fever, pain and redness of his knee for 3 days as well as (bloody) drainage from his wound. He temps to 101 at home. In ED his temp was 99.9, WBC 24k and he was hyponatremic. He was taken to the OR and underwent I & D. He was started on vanco/zoyn. ESR 102. His wound cx grew abundant MRSA. He was d/c home on vanco/rifampin on 12-10-13. He was seen in ID clinic on 12-31-13, still on anbx, some persistent wound d/c. Finished his IV anbx 01-09-14. Had difficulty with his IV infusing slowly. Was placed on po anbx (bactrim).  Has minimal wound d/c now. No fever or chills. Has noted tremulousness of his hands. Wt bearing is improving.   Review of Systems     Objective:   Physical Exam  Constitutional: He appears well-developed and well-nourished.  Musculoskeletal:       Legs:         Assessment & Plan:

## 2014-02-26 ENCOUNTER — Other Ambulatory Visit (HOSPITAL_COMMUNITY): Payer: Self-pay | Admitting: Orthopaedic Surgery

## 2014-02-27 ENCOUNTER — Other Ambulatory Visit (HOSPITAL_COMMUNITY): Payer: Self-pay | Admitting: Orthopaedic Surgery

## 2014-02-27 NOTE — Progress Notes (Signed)
Need orders in EPIC.  Surgery scheduled for 03/08/14.  Thank You.

## 2014-03-04 ENCOUNTER — Encounter (HOSPITAL_COMMUNITY): Payer: Self-pay | Admitting: Pharmacy Technician

## 2014-03-05 ENCOUNTER — Encounter (HOSPITAL_COMMUNITY)
Admission: RE | Admit: 2014-03-05 | Discharge: 2014-03-05 | Disposition: A | Discharge status: Home / Self Care | Payer: BC Managed Care – PPO | Source: Ambulatory Visit | Attending: Orthopaedic Surgery | Admitting: Orthopaedic Surgery

## 2014-03-05 ENCOUNTER — Encounter (HOSPITAL_COMMUNITY): Payer: Self-pay

## 2014-03-05 LAB — BASIC METABOLIC PANEL
BUN: 15 mg/dL (ref 6–23)
CALCIUM: 9.3 mg/dL (ref 8.4–10.5)
CO2: 25 mEq/L (ref 19–32)
Chloride: 100 mEq/L (ref 96–112)
Creatinine, Ser: 1 mg/dL (ref 0.50–1.35)
GFR, EST NON AFRICAN AMERICAN: 87 mL/min — AB (ref >90)
Glucose, Bld: 61 mg/dL — ABNORMAL LOW (ref 70–99)
Potassium: 4.9 mEq/L (ref 3.7–5.3)
SODIUM: 136 meq/L — AB (ref 137–147)

## 2014-03-05 LAB — CBC
HCT: 38.6 % — ABNORMAL LOW (ref 39.0–52.0)
Hemoglobin: 13 g/dL (ref 13.0–17.0)
MCH: 30.5 pg (ref 26.0–34.0)
MCHC: 33.7 g/dL (ref 30.0–36.0)
MCV: 90.6 fL (ref 78.0–100.0)
Platelets: 337 10*3/uL (ref 150–400)
RBC: 4.26 MIL/uL (ref 4.22–5.81)
RDW: 14.5 % (ref 11.5–15.5)
WBC: 8.4 10*3/uL (ref 4.0–10.5)

## 2014-03-05 LAB — C-REACTIVE PROTEIN

## 2014-03-05 LAB — SEDIMENTATION RATE: SED RATE: 7 mm/h (ref 0–16)

## 2014-03-05 NOTE — Patient Instructions (Addendum)
[place]  03/05/2014   Your procedure is scheduled on: 03/08/14  Report to [facility] at 7:15 AM.  Call this number if you have problems the morning of surgery 336-: [phone]   Remember: please bring CPAP mask and tubing   Do not eat food or drink liquids After Midnight.     Take these medicines the morning of surgery with A SIP OF WATER: omeprazole, sertraline, Bactrim, percocet if needed   Do not wear jewelry, make-up or nail polish.  Do not wear lotions, powders, or perfumes. You may wear deodorant.  Do not shave 48 hours prior to surgery. Men may shave face and neck.  Do not bring valuables to the hospital.  Contacts, dentures or bridgework may not be worn into surgery.  Leave suitcase in the car. After surgery it may be brought to your room.  For patients admitted to the hospital, checkout time is 11:00 AM the day of discharge.    Please read over the following fact sheets that you were given:[place] preparing for surgery sheet Paulette Blanch, RN  pre op nurse call if needed [phone]    FAILURE TO [place]   Patient Signature: ___________________________________________

## 2014-03-05 NOTE — Progress Notes (Signed)
EKG 12/16/13 on EPIC, Chest x-ray 12/05/13 on EPIC

## 2014-03-06 NOTE — Progress Notes (Signed)
Faxed c reactive protein to [facility] via epic

## 2014-03-08 ENCOUNTER — Encounter (HOSPITAL_COMMUNITY): Payer: Self-pay | Admitting: *Deleted

## 2014-03-08 ENCOUNTER — Observation Stay (HOSPITAL_COMMUNITY): Payer: BC Managed Care – PPO

## 2014-03-08 ENCOUNTER — Encounter (HOSPITAL_COMMUNITY): Payer: BC Managed Care – PPO | Admitting: Certified Registered Nurse Anesthetist

## 2014-03-08 ENCOUNTER — Observation Stay (HOSPITAL_COMMUNITY)
Admission: RE | Admit: 2014-03-08 | Discharge: 2014-03-09 | Disposition: A | Discharge status: Home / Self Care | Payer: BC Managed Care – PPO | Source: Ambulatory Visit | Attending: Orthopaedic Surgery | Admitting: Orthopaedic Surgery

## 2014-03-08 ENCOUNTER — Encounter (HOSPITAL_COMMUNITY)
Admission: RE | Disposition: A | Discharge status: Home / Self Care | Payer: Self-pay | Source: Ambulatory Visit | Attending: Orthopaedic Surgery

## 2014-03-08 ENCOUNTER — Ambulatory Visit (HOSPITAL_COMMUNITY): Payer: BC Managed Care – PPO | Admitting: Certified Registered Nurse Anesthetist

## 2014-03-08 DIAGNOSIS — G4730 Sleep apnea, unspecified: Secondary | ICD-10-CM | POA: Insufficient documentation

## 2014-03-08 DIAGNOSIS — F17200 Nicotine dependence, unspecified, uncomplicated: Secondary | ICD-10-CM | POA: Insufficient documentation

## 2014-03-08 DIAGNOSIS — K219 Gastro-esophageal reflux disease without esophagitis: Secondary | ICD-10-CM | POA: Insufficient documentation

## 2014-03-08 DIAGNOSIS — E669 Obesity, unspecified: Secondary | ICD-10-CM | POA: Insufficient documentation

## 2014-03-08 DIAGNOSIS — Z969 Presence of functional implant, unspecified: Secondary | ICD-10-CM

## 2014-03-08 DIAGNOSIS — Z472 Encounter for removal of internal fixation device: Secondary | ICD-10-CM | POA: Insufficient documentation

## 2014-03-08 DIAGNOSIS — Z9889 Other specified postprocedural states: Secondary | ICD-10-CM

## 2014-03-08 DIAGNOSIS — I10 Essential (primary) hypertension: Secondary | ICD-10-CM | POA: Insufficient documentation

## 2014-03-08 DIAGNOSIS — M2240 Chondromalacia patellae, unspecified knee: Secondary | ICD-10-CM | POA: Insufficient documentation

## 2014-03-08 DIAGNOSIS — M25469 Effusion, unspecified knee: Principal | ICD-10-CM | POA: Insufficient documentation

## 2014-03-08 HISTORY — PX: HARDWARE REMOVAL: SHX979

## 2014-03-08 HISTORY — PX: KNEE ARTHROSCOPY: SHX127

## 2014-03-08 LAB — MRSA PCR SCREENING: MRSA BY PCR: NEGATIVE

## 2014-03-08 SURGERY — ARTHROSCOPY, KNEE
Anesthesia: General | Site: Knee | Laterality: Right

## 2014-03-08 MED ORDER — PROPOFOL 10 MG/ML IV BOLUS
INTRAVENOUS | Status: DC | PRN
  Administered 2014-03-08: 200 mg via INTRAVENOUS

## 2014-03-08 MED ORDER — HYDROCODONE-ACETAMINOPHEN 5-325 MG PO TABS: 1.0000 | ORAL_TABLET | ORAL | Status: DC | PRN

## 2014-03-08 MED ORDER — ROPIVACAINE HCL 5 MG/ML IJ SOLN
INTRAMUSCULAR | Status: AC
  Filled 2014-03-08: qty 30

## 2014-03-08 MED ORDER — FENTANYL CITRATE 0.05 MG/ML IJ SOLN
INTRAMUSCULAR | Status: DC | PRN
  Administered 2014-03-08 (×3): 50 ug via INTRAVENOUS
  Administered 2014-03-08: 100 ug via INTRAVENOUS

## 2014-03-08 MED ORDER — DOCUSATE SODIUM 100 MG PO CAPS
100.0000 mg | ORAL_CAPSULE | Freq: Two times a day (BID) | ORAL | Status: DC
  Administered 2014-03-08 – 2014-03-09 (×2): 100 mg via ORAL

## 2014-03-08 MED ORDER — DEXAMETHASONE SODIUM PHOSPHATE 10 MG/ML IJ SOLN
INTRAMUSCULAR | Status: DC | PRN
  Administered 2014-03-08: 10 mg via INTRAVENOUS

## 2014-03-08 MED ORDER — DOCUSATE SODIUM 100 MG PO CAPS: 100.0000 mg | ORAL_CAPSULE | Freq: Two times a day (BID) | ORAL | Status: DC | PRN

## 2014-03-08 MED ORDER — HYDROMORPHONE HCL PF 1 MG/ML IJ SOLN
1.0000 mg | INTRAMUSCULAR | Status: DC | PRN
  Administered 2014-03-08: 1 mg via INTRAVENOUS
  Filled 2014-03-08: qty 1

## 2014-03-08 MED ORDER — MORPHINE SULFATE 4 MG/ML IJ SOLN
INTRAMUSCULAR | Status: AC
  Filled 2014-03-08: qty 1

## 2014-03-08 MED ORDER — ONDANSETRON HCL 4 MG PO TABS: 4.0000 mg | ORAL_TABLET | Freq: Four times a day (QID) | ORAL | Status: DC | PRN

## 2014-03-08 MED ORDER — ONDANSETRON HCL 4 MG/2ML IJ SOLN
INTRAMUSCULAR | Status: AC
  Filled 2014-03-08: qty 2

## 2014-03-08 MED ORDER — OXYCODONE HCL 5 MG/5ML PO SOLN
5.0000 mg | Freq: Once | ORAL | Status: DC | PRN
  Filled 2014-03-08: qty 5

## 2014-03-08 MED ORDER — 0.9 % SODIUM CHLORIDE (POUR BTL) OPTIME
TOPICAL | Status: DC | PRN
  Administered 2014-03-08: 1000 mL

## 2014-03-08 MED ORDER — CLINDAMYCIN PHOSPHATE 900 MG/50ML IV SOLN
INTRAVENOUS | Status: AC
  Filled 2014-03-08: qty 50

## 2014-03-08 MED ORDER — OXYCODONE HCL 5 MG PO TABS: 5.0000 mg | ORAL_TABLET | Freq: Once | ORAL | Status: DC | PRN

## 2014-03-08 MED ORDER — FENTANYL CITRATE 0.05 MG/ML IJ SOLN
INTRAMUSCULAR | Status: AC
  Filled 2014-03-08: qty 5

## 2014-03-08 MED ORDER — METHOCARBAMOL 500 MG PO TABS
500.0000 mg | ORAL_TABLET | Freq: Four times a day (QID) | ORAL | Status: DC | PRN
  Administered 2014-03-08: 500 mg via ORAL
  Filled 2014-03-08: qty 1

## 2014-03-08 MED ORDER — ONDANSETRON HCL 4 MG/2ML IJ SOLN: 4.0000 mg | Freq: Four times a day (QID) | INTRAMUSCULAR | Status: DC | PRN

## 2014-03-08 MED ORDER — DEXAMETHASONE SODIUM PHOSPHATE 10 MG/ML IJ SOLN
INTRAMUSCULAR | Status: AC
  Filled 2014-03-08: qty 1

## 2014-03-08 MED ORDER — LACTATED RINGERS IR SOLN
Status: DC | PRN
  Administered 2014-03-08: 6000 mL

## 2014-03-08 MED ORDER — EPHEDRINE SULFATE 50 MG/ML IJ SOLN
INTRAMUSCULAR | Status: DC | PRN
  Administered 2014-03-08 (×2): 5 mg via INTRAVENOUS
  Administered 2014-03-08 (×2): 10 mg via INTRAVENOUS

## 2014-03-08 MED ORDER — PANTOPRAZOLE SODIUM 40 MG PO TBEC
40.0000 mg | DELAYED_RELEASE_TABLET | Freq: Every day | ORAL | Status: DC
  Administered 2014-03-09: 40 mg via ORAL
  Filled 2014-03-08: qty 1

## 2014-03-08 MED ORDER — PROPOFOL 10 MG/ML IV BOLUS
INTRAVENOUS | Status: AC
  Filled 2014-03-08: qty 20

## 2014-03-08 MED ORDER — VANCOMYCIN HCL IN DEXTROSE 1-5 GM/200ML-% IV SOLN
1000.0000 mg | Freq: Two times a day (BID) | INTRAVENOUS | Status: AC
  Administered 2014-03-08: 1000 mg via INTRAVENOUS
  Filled 2014-03-08: qty 200

## 2014-03-08 MED ORDER — EPHEDRINE SULFATE 50 MG/ML IJ SOLN
INTRAMUSCULAR | Status: AC
  Filled 2014-03-08: qty 1

## 2014-03-08 MED ORDER — ESMOLOL HCL 10 MG/ML IV SOLN
INTRAVENOUS | Status: AC
  Filled 2014-03-08: qty 10

## 2014-03-08 MED ORDER — PROMETHAZINE HCL 25 MG/ML IJ SOLN: 6.2500 mg | INTRAMUSCULAR | Status: DC | PRN

## 2014-03-08 MED ORDER — LACTATED RINGERS IV SOLN
INTRAVENOUS | Status: DC
  Administered 2014-03-08: 1000 mL via INTRAVENOUS
  Administered 2014-03-08: 10:00:00 via INTRAVENOUS

## 2014-03-08 MED ORDER — MORPHINE SULFATE 10 MG/ML IJ SOLN: 1.0000 mg | INTRAMUSCULAR | Status: DC | PRN

## 2014-03-08 MED ORDER — DIPHENHYDRAMINE HCL 12.5 MG/5ML PO ELIX: 12.5000 mg | ORAL_SOLUTION | ORAL | Status: DC | PRN

## 2014-03-08 MED ORDER — POLYETHYLENE GLYCOL 3350 17 G PO PACK: 17.0000 g | PACK | Freq: Every day | ORAL | Status: DC | PRN

## 2014-03-08 MED ORDER — HYDROMORPHONE HCL PF 1 MG/ML IJ SOLN
0.2500 mg | INTRAMUSCULAR | Status: DC | PRN
  Administered 2014-03-08: 0.5 mg via INTRAVENOUS
  Administered 2014-03-08 (×2): 0.25 mg via INTRAVENOUS

## 2014-03-08 MED ORDER — ROPIVACAINE HCL 5 MG/ML IJ SOLN
INTRAMUSCULAR | Status: DC | PRN
  Administered 2014-03-08: 30 mL via EPIDURAL

## 2014-03-08 MED ORDER — MIDAZOLAM HCL 5 MG/5ML IJ SOLN
INTRAMUSCULAR | Status: DC | PRN
  Administered 2014-03-08: 2 mg via INTRAVENOUS

## 2014-03-08 MED ORDER — SERTRALINE HCL 50 MG PO TABS
50.0000 mg | ORAL_TABLET | Freq: Every morning | ORAL | Status: DC
  Administered 2014-03-09: 50 mg via ORAL
  Filled 2014-03-08: qty 1

## 2014-03-08 MED ORDER — SODIUM CHLORIDE 0.9 % IV BOLUS (SEPSIS): 500.0000 mL | Freq: Once | INTRAVENOUS | Status: DC

## 2014-03-08 MED ORDER — LIDOCAINE HCL (CARDIAC) 20 MG/ML IV SOLN
INTRAVENOUS | Status: AC
  Filled 2014-03-08: qty 5

## 2014-03-08 MED ORDER — MIDAZOLAM HCL 2 MG/2ML IJ SOLN
INTRAMUSCULAR | Status: AC
  Filled 2014-03-08: qty 2

## 2014-03-08 MED ORDER — MEPERIDINE HCL 50 MG/ML IJ SOLN: 6.2500 mg | INTRAMUSCULAR | Status: DC | PRN

## 2014-03-08 MED ORDER — CLINDAMYCIN PHOSPHATE 900 MG/50ML IV SOLN
900.0000 mg | INTRAVENOUS | Status: AC
  Administered 2014-03-08: 900 mg via INTRAVENOUS

## 2014-03-08 MED ORDER — BUPIVACAINE HCL (PF) 0.25 % IJ SOLN
INTRAMUSCULAR | Status: AC
  Filled 2014-03-08: qty 30

## 2014-03-08 MED ORDER — ADULT MULTIVITAMIN W/MINERALS CH
1.0000 | ORAL_TABLET | Freq: Every day | ORAL | Status: DC
  Administered 2014-03-09: 1 via ORAL
  Filled 2014-03-08 (×2): qty 1

## 2014-03-08 MED ORDER — LISINOPRIL 5 MG PO TABS
5.0000 mg | ORAL_TABLET | Freq: Every morning | ORAL | Status: DC
  Administered 2014-03-08: 5 mg via ORAL
  Filled 2014-03-08 (×2): qty 1

## 2014-03-08 MED ORDER — LIDOCAINE HCL (CARDIAC) 20 MG/ML IV SOLN
INTRAVENOUS | Status: DC | PRN
  Administered 2014-03-08: 100 mg via INTRAVENOUS

## 2014-03-08 MED ORDER — OXYCODONE HCL 5 MG PO TABS
5.0000 mg | ORAL_TABLET | ORAL | Status: DC | PRN
  Administered 2014-03-08 – 2014-03-09 (×6): 10 mg via ORAL
  Filled 2014-03-08 (×6): qty 2

## 2014-03-08 MED ORDER — METHOCARBAMOL 100 MG/ML IJ SOLN
500.0000 mg | Freq: Four times a day (QID) | INTRAVENOUS | Status: DC | PRN
  Administered 2014-03-08: 500 mg via INTRAVENOUS
  Filled 2014-03-08: qty 5

## 2014-03-08 MED ORDER — HYDROMORPHONE HCL PF 1 MG/ML IJ SOLN
INTRAMUSCULAR | Status: AC
  Filled 2014-03-08: qty 1

## 2014-03-08 MED ORDER — SODIUM CHLORIDE 0.9 % IV SOLN
INTRAVENOUS | Status: DC
  Administered 2014-03-08 – 2014-03-09 (×2): via INTRAVENOUS

## 2014-03-08 MED ORDER — METOCLOPRAMIDE HCL 5 MG PO TABS
5.0000 mg | ORAL_TABLET | Freq: Three times a day (TID) | ORAL | Status: DC | PRN
  Filled 2014-03-08: qty 2

## 2014-03-08 MED ORDER — BUPIVACAINE HCL 0.25 % IJ SOLN
INTRAMUSCULAR | Status: DC | PRN
  Administered 2014-03-08: 10 mL

## 2014-03-08 MED ORDER — ESMOLOL HCL 10 MG/ML IV SOLN
INTRAVENOUS | Status: DC | PRN
  Administered 2014-03-08: 10 mg via INTRAVENOUS
  Administered 2014-03-08: 20 mg via INTRAVENOUS

## 2014-03-08 MED ORDER — FENTANYL CITRATE 0.05 MG/ML IJ SOLN
INTRAMUSCULAR | Status: AC
  Filled 2014-03-08: qty 2

## 2014-03-08 MED ORDER — MORPHINE SULFATE 4 MG/ML IJ SOLN
INTRAMUSCULAR | Status: DC | PRN
  Administered 2014-03-08: 4 mg via INTRAVENOUS

## 2014-03-08 MED ORDER — SODIUM CHLORIDE 0.9 % IR SOLN
Status: DC | PRN
  Administered 2014-03-08: 3000 mL

## 2014-03-08 MED ORDER — METOCLOPRAMIDE HCL 5 MG/ML IJ SOLN: 5.0000 mg | Freq: Three times a day (TID) | INTRAMUSCULAR | Status: DC | PRN

## 2014-03-08 MED ORDER — ONDANSETRON HCL 4 MG/2ML IJ SOLN
INTRAMUSCULAR | Status: DC | PRN
  Administered 2014-03-08: 4 mg via INTRAVENOUS

## 2014-03-08 MED ORDER — SODIUM CHLORIDE 0.9 % IJ SOLN
INTRAMUSCULAR | Status: AC
  Filled 2014-03-08: qty 10

## 2014-03-08 SURGICAL SUPPLY — 57 items
BAG ZIPLOCK 12X15 (MISCELLANEOUS) ×3 IMPLANT
BANDAGE ELASTIC 6 VELCRO ST LF (GAUZE/BANDAGES/DRESSINGS) ×3 IMPLANT
BANDAGE ESMARK 6X9 LF (GAUZE/BANDAGES/DRESSINGS) ×1 IMPLANT
BLADE CUDA SHAVER 3.5 (BLADE) ×3 IMPLANT
BNDG ESMARK 6X9 LF (GAUZE/BANDAGES/DRESSINGS) ×3
CLOSURE WOUND 1/2 X4 (GAUZE/BANDAGES/DRESSINGS)
COUNTER NEEDLE 20 DBL MAG RED (NEEDLE) ×3 IMPLANT
CUFF TOURN SGL QUICK 34 (TOURNIQUET CUFF) ×2
CUFF TRNQT CYL 34X4X40X1 (TOURNIQUET CUFF) ×1 IMPLANT
DRAPE POUCH INSTRU U-SHP 10X18 (DRAPES) ×3 IMPLANT
DRAPE STERI IOBAN 125X83 (DRAPES) IMPLANT
DRAPE U-SHAPE 47X51 STRL (DRAPES) ×3 IMPLANT
DRSG PAD ABDOMINAL 8X10 ST (GAUZE/BANDAGES/DRESSINGS) ×3 IMPLANT
DURAPREP 26ML APPLICATOR (WOUND CARE) ×3 IMPLANT
ELECT REM PT RETURN 9FT ADLT (ELECTROSURGICAL) ×6
ELECTRODE REM PT RTRN 9FT ADLT (ELECTROSURGICAL) ×2 IMPLANT
GAUZE XEROFORM 1X8 LF (GAUZE/BANDAGES/DRESSINGS) ×3 IMPLANT
GAUZE XEROFORM 5X9 LF (GAUZE/BANDAGES/DRESSINGS) ×3 IMPLANT
GLOVE BIO SURGEON STRL SZ7.5 (GLOVE) ×3 IMPLANT
GLOVE BIOGEL PI IND STRL 8 (GLOVE) ×2 IMPLANT
GLOVE BIOGEL PI INDICATOR 8 (GLOVE) ×4
GLOVE ECLIPSE 8.0 STRL XLNG CF (GLOVE) ×3 IMPLANT
GOWN STRL REUS W/TWL XL LVL3 (GOWN DISPOSABLE) ×3 IMPLANT
IV LACTATED RINGER IRRG 3000ML (IV SOLUTION) ×4
IV LR IRRIG 3000ML ARTHROMATIC (IV SOLUTION) ×2 IMPLANT
KIT BASIN OR (CUSTOM PROCEDURE TRAY) ×3 IMPLANT
MANIFOLD NEPTUNE II (INSTRUMENTS) ×3 IMPLANT
NS IRRIG 1000ML POUR BTL (IV SOLUTION) ×3 IMPLANT
PACK ARTHROSCOPY WL (CUSTOM PROCEDURE TRAY) ×3 IMPLANT
PACK TOTAL JOINT (CUSTOM PROCEDURE TRAY) ×3 IMPLANT
PAD ABD 8X10 STRL (GAUZE/BANDAGES/DRESSINGS) ×9 IMPLANT
PADDING CAST ABS 6INX4YD NS (CAST SUPPLIES) ×2
PADDING CAST ABS COTTON 6X4 NS (CAST SUPPLIES) ×1 IMPLANT
PADDING CAST COTTON 6X4 STRL (CAST SUPPLIES) ×3 IMPLANT
POSITIONER SURGICAL ARM (MISCELLANEOUS) ×3 IMPLANT
SET ARTHROSCOPY TUBING (MISCELLANEOUS) ×2
SET ARTHROSCOPY TUBING LN (MISCELLANEOUS) ×1 IMPLANT
SPONGE GAUZE 4X4 12PLY (GAUZE/BANDAGES/DRESSINGS) ×3 IMPLANT
SPONGE LAP 18X18 X RAY DECT (DISPOSABLE) ×6 IMPLANT
STAPLER VISISTAT 35W (STAPLE) IMPLANT
STRIP CLOSURE SKIN 1/2X4 (GAUZE/BANDAGES/DRESSINGS) IMPLANT
SUT ETHILON 2 0 PS N (SUTURE) ×12 IMPLANT
SUT ETHILON 4 0 PS 2 18 (SUTURE) ×3 IMPLANT
SUT MNCRL AB 4-0 PS2 18 (SUTURE) IMPLANT
SUT VIC AB 1 CT1 27 (SUTURE) ×2
SUT VIC AB 1 CT1 27XBRD ANTBC (SUTURE) ×1 IMPLANT
SUT VIC AB 2-0 CT1 27 (SUTURE) ×6
SUT VIC AB 2-0 CT1 TAPERPNT 27 (SUTURE) ×3 IMPLANT
SYR 20CC LL (SYRINGE) ×3 IMPLANT
SYR CONTROL 10ML LL (SYRINGE) ×3 IMPLANT
TOWEL OR 17X26 10 PK STRL BLUE (TOWEL DISPOSABLE) ×6 IMPLANT
TUBING CONNECTING 10 (TUBING) IMPLANT
TUBING CONNECTING 10' (TUBING)
WAND 90 DEG TURBOVAC W/CORD (SURGICAL WAND) IMPLANT
WATER STERILE IRR 1500ML POUR (IV SOLUTION) ×3 IMPLANT
WRAP KNEE MAXI GEL POST OP (GAUZE/BANDAGES/DRESSINGS) ×3 IMPLANT
YANKAUER SUCT BULB TIP 10FT TU (MISCELLANEOUS) ×3 IMPLANT

## 2014-03-08 NOTE — H&P (Signed)
Roger Ayers is an 48 Ayers.o. male.   Chief Complaint:   Right knee pain and swelling; known previous tibial plateau fracture HPI:   48 yo mle who sustained a severely comminuted right tibial plateau fracture in November 2014.  Underwent ORIF and then developed a post-op infection.  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  Past Medical History  Diagnosis Date  . Hypertension   . GERD (gastroesophageal reflux disease)   . Sleep apnea     cpap    Past Surgical History  Procedure Laterality Date  . Mouth surgery  2014    bone grafts for implants  . Trigger finger release Right ~2013    middle finger  . External fixation leg Right 11/17/2013    Procedure: EXTERNAL FIXATION LEG;  Surgeon: Marianna Payment, MD;  Location: [facility];  Service: Orthopedics;  Laterality: Right;  . Orif tibia plateau Right 11/23/2013    Procedure: OPEN REDUCTION INTERNAL FIXATION (ORIF) RIGHT TIBIAL PLATEAU;  Surgeon: Mcarthur Rossetti, MD;  Location: [facility];  Service: Orthopedics;  Laterality: Right;  . External fixation removal Right 11/23/2013    Procedure: REMOVAL EXTERNAL FIXATION RIGHT LEG;  Surgeon: Mcarthur Rossetti, MD;  Location: [facility];  Service: Orthopedics;  Laterality: Right;  . I&d extremity Right 12/05/2013    Procedure: IRRIGATION AND DEBRIDEMENT RIGHT LEG;  Surgeon: Mcarthur Rossetti, MD;  Location: [place];  Service: Orthopedics;  Laterality: Right;  . I&d extremity Right 12/08/2013    Procedure: Repeat IRRIGATION AND DEBRIDEMENT Right Leg with Stimulan bead placement;  Surgeon: Mcarthur Rossetti, MD;  Location: [place];  Service: Orthopedics;  Laterality: Right;    Family History  Problem Relation Age of Onset  . Stroke Father     38s   Social History:  reports that he has been smoking Cigarettes.  He has a 10 pack-year  smoking history. He has never used smokeless tobacco. He reports that he drinks alcohol. He reports that he does not use illicit drugs.  Allergies:  Allergies  Allergen Reactions  . Thimerosal     Back when he was in high school (dicontinued med)    No prescriptions prior to admission    No results found for this or any previous visit (from the past 48 hour(s)). No results found.  Review of Systems  All other systems reviewed and are negative.    There were no vitals taken for this visit. Physical Exam  Constitutional: He is oriented to person, place, and time. He appears well-developed and well-nourished.  HENT:  Head: Normocephalic and atraumatic.  Eyes: EOM are normal. Pupils are equal, round, and reactive to light.  Neck: Normal range of motion. Neck supple.  Cardiovascular: Normal rate and regular rhythm.   Respiratory: Effort normal and breath sounds normal.  GI: Soft. Bowel sounds are normal.  Musculoskeletal:       Right knee: He exhibits decreased range of motion, swelling, effusion and deformity.       Legs: Neurological: He is alert and oriented to person, place, and time.  Skin: Skin is warm and dry.  Psychiatric: He has a normal mood and affect.     Assessment/Plan Post ORIF of severely comminuted right tibial plateau fracture with history of a post-op infection with MRSA.  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

## 2014-03-08 NOTE — Transfer of Care (Signed)
Immediate Anesthesia Transfer of Care Note  Patient: Roger Ayers  Procedure(s) Performed: Procedure(s) (LRB):  arthroscopy RIGHT KNEE WITH EXTENSIVE DEBRIDEMENT (Right) Removal plate and screws right tibia, (Right)  Patient Location: PACU  Anesthesia Type: General  Level of Consciousness: sedated, patient cooperative and responds to stimulation  Airway & Oxygen Therapy: Patient Spontanous Breathing and Patient connected to face mask oxgen  Post-op Assessment: Report given to PACU RN and Post -op Vital signs reviewed and stable  Post vital signs: Reviewed and stable  Complications: No apparent anesthesia complications

## 2014-03-08 NOTE — Preoperative (Signed)
Beta Blockers   Reason not to administer Beta Blockers:Not Applicable 

## 2014-03-08 NOTE — Anesthesia Preprocedure Evaluation (Addendum)
Anesthesia Evaluation  Patient identified by MRN, date of birth, ID band Patient awake    Reviewed: Allergy & Precautions, H&P , NPO status , Patient's Chart, lab work & pertinent test results  Airway Mallampati: II TM Distance: >3 FB Neck ROM: Full    Dental no notable dental hx.    Pulmonary sleep apnea , Current Smoker,  CXR: Normal. breath sounds clear to auscultation  Pulmonary exam normal       Cardiovascular Exercise Tolerance: Good hypertension, Pt. on medications negative cardio ROS  Rhythm:Regular Rate:Normal  ECG: Normal. 2012   Neuro/Psych negative neurological ROS  negative psych ROS   GI/Hepatic Neg liver ROS, GERD-  Medicated,  Endo/Other  negative endocrine ROS  Renal/GU negative Renal ROS     Musculoskeletal negative musculoskeletal ROS (+)   Abdominal (+) + obese,   Peds  Hematology negative hematology ROS (+)   Anesthesia Other Findings   Reproductive/Obstetrics negative OB ROS                          Anesthesia Physical  Anesthesia Plan  ASA: II  Anesthesia Plan: General   Post-op Pain Management:    Induction: Intravenous  Airway Management Planned: LMA  Additional Equipment:   Intra-op Plan:   Post-operative Plan: Extubation in OR  Informed Consent: I have reviewed the patients History and Physical, chart, labs and discussed the procedure including the risks, benefits and alternatives for the proposed anesthesia with the patient or authorized representative who has indicated his/her understanding and acceptance.   Dental advisory given  Plan Discussed with: CRNA  Anesthesia Plan Comments:         Anesthesia Quick Evaluation

## 2014-03-08 NOTE — Anesthesia Postprocedure Evaluation (Signed)
Anesthesia Post Note  Patient: Roger Ayers  Procedure(s) Performed: Procedure(s) (LRB):  arthroscopy RIGHT KNEE WITH EXTENSIVE DEBRIDEMENT (Right) Removal plate and screws right tibia, (Right)  Anesthesia type: General  Patient location: PACU  Post pain: Pain level controlled  Post assessment: Post-op Vital signs reviewed  Last Vitals: BP 120/70  Pulse 71  Temp(Src) 36.4 C (Axillary)  Resp 16  Ht 5' 10.5" (1.791 m)  Wt 231 lb 0.7 oz (104.8 kg)  BMI 32.67 kg/m2  SpO2 98%  Post vital signs: Reviewed  Level of consciousness: sedated  Complications: No apparent anesthesia complications

## 2014-03-08 NOTE — Anesthesia Procedure Notes (Signed)
Anesthesia Regional Block:  Femoral nerve block  Pre-Anesthetic Checklist: ,, timeout performed, Correct Patient, Correct Site, Correct Laterality, Correct Procedure, Correct Position, site marked, Risks and benefits discussed,  Surgical consent,  Pre-op evaluation,  At surgeon's request and post-op pain management  Laterality: Right  Prep: chloraprep       Needles:  Injection technique: Single-shot  Needle Type: Stimulator Needle - 80     Needle Length: 9cm 9 cm Needle Gauge: 22 and 22 G  Needle insertion depth: 6 cm   Additional Needles:  Procedures: ultrasound guided (picture in chart) and nerve stimulator  Motor weakness within 5 minutes. Femoral nerve block  Nerve Stimulator or Paresthesia:  Response: Twitch elicited, 0.6 mA,   Additional Responses:   Narrative:  Start time: 03/08/2014 9:30 AM End time: 03/08/2014 9:35 AM Injection made incrementally with aspirations every 5 mL.  Performed by: Personally  Anesthesiologist: Lissa Hoard, MD  Additional Notes: BP cuff, EKG monitors applied. Sedation begun. Femoral artery palpated for location of nerve. After nerve location via ultrasound anesthetic injected incrementally, slowly , and after neg aspirations. Tolerated well.

## 2014-03-08 NOTE — Brief Op Note (Signed)
03/08/2014  11:54 AM  PATIENT:  Roger Ayers  48 y.o. male  PRE-OPERATIVE DIAGNOSIS:  Retained hardware right proximal tibia, infection right proximal tibia, previous right tibial plateau fracture with possible lateral meniscal tear  POST-OPERATIVE DIAGNOSIS:  tibia, infection right proximal tibia, previous right tibial plateau   PROCEDURE:  Procedure(s):  arthroscopy RIGHT KNEE WITH EXTENSIVE DEBRIDEMENT (Right) Removal plate and screws right tibia, (Right)  SURGEON:  Surgeon(s) and Role:    * Mcarthur Rossetti, MD - Primary  PHYSICIAN ASSISTANT: Benita Stabile, PA-c  ANESTHESIA:   general  EBL:  Total I/O In: 1000 [I.V.:1000] Out: -   BLOOD ADMINISTERED:none  DRAINS: none   LOCAL MEDICATIONS USED:  MARCAINE     SPECIMEN:  No Specimen  DISPOSITION OF SPECIMEN:  N/A  COUNTS:  YES  TOURNIQUET:  * Missing tourniquet times found for documented tourniquets in log:  [id number] *  DICTATION: .Other Dictation: Dictation Number [id number]  PLAN OF CARE: Admit to inpatient   PATIENT DISPOSITION:  PACU - hemodynamically stable.   Delay start of Pharmacological VTE agent (>24hrs) due to surgical blood loss or risk of bleeding: no

## 2014-03-09 MED ORDER — ASPIRIN EC 325 MG PO TBEC: 325.0000 mg | DELAYED_RELEASE_TABLET | Freq: Every day | ORAL | Status: DC

## 2014-03-09 MED ORDER — OXYCODONE-ACETAMINOPHEN 5-325 MG PO TABS: 1.0000 | ORAL_TABLET | ORAL | Status: DC | PRN

## 2014-03-09 NOTE — Evaluation (Signed)
Physical Therapy Evaluation Patient Details Name: Roger Ayers MRN: [id number] DOB: [birth year] Today's Date: 03/09/2014 Time: 4098-1191 PT Time Calculation (min): 17 min  PT Assessment / Plan / Recommendation History of Present Illness     Clinical Impression  Pt s/p R knee arthroscopic procedure and removal of retained hardware presents as performing basic mobility tasks at MOD I level with good adherence to TWB utilizing RW.  Pt plans d/c home this date with family assist    PT Assessment  Patent does not need any further PT services    Follow Up Recommendations  No PT follow up    Does the patient have the potential to tolerate intense rehabilitation      Barriers to Discharge        Equipment Recommendations  None recommended by PT    Recommendations for Other Services     Frequency      Precautions / Restrictions Precautions Precautions: Fall Restrictions Weight Bearing Restrictions: Yes RLE Weight Bearing: Touchdown weight bearing   Pertinent Vitals/Pain 4/10; meds requested.     Mobility  Bed Mobility Overal bed mobility: Modified Independent Transfers Overall transfer level: Modified independent Equipment used: Rolling walker (2 wheeled) Ambulation/Gait Ambulation/Gait assistance: Modified independent (Device/Increase time) Ambulation Distance (Feet): 200 Feet Assistive device: Rolling walker (2 wheeled) Gait Pattern/deviations: Step-to pattern;Step-through pattern General Gait Details: Pt demonstrating excellent stability and adherence to TWB  Stairs:  (Pt declined stating he felt comfortable with ability)    Exercises     PT Diagnosis:    PT Problem List:   PT Treatment Interventions:       PT Goals(Current goals can be found in the care plan section) Acute Rehab PT Goals Patient Stated Goal: Back to normal life  Visit Information  Last PT Received On: 03/09/14 Assistance Needed: +1       Prior [place] expects to be discharged to:: Private residence Living Arrangements: Spouse/significant other Available Help at Discharge: Family;Available 24 hours/day Type of Home: House Home Access: Stairs to enter [employer] of Steps: 3 Entrance Stairs-Rails: None Home Layout: One level Home Equipment: Walker - 2 wheels;Bedside commode;Tub bench Prior Function Level of Independence: Independent with assistive device(s) Comments: Pt was mobilizing wtih cane just prior to this surgery Communication Communication: No difficulties Dominant Hand: Right    Cognition  Cognition Arousal/Alertness: Awake/alert Behavior During Therapy: WFL for tasks assessed/performed Overall Cognitive Status: Within Functional Limits for tasks assessed    Extremity/Trunk Assessment Upper Extremity Assessment Upper Extremity Assessment: Overall WFL for tasks assessed Lower Extremity Assessment Lower Extremity Assessment: RLE deficits/detail RLE: Unable to fully assess due to pain   Balance    End of Session PT - End of Session Activity Tolerance: Patient tolerated treatment well Patient left: in chair;with call bell/phone within reach Nurse Communication: Mobility status  GP Functional Assessment Tool Used: Clinical judgement Functional Limitation: Mobility: Walking and moving around Mobility: Walking and Moving Around Current Status ([id number]): At least 1 percent but less than 20 percent impaired, limited or restricted Mobility: Walking and Moving Around Goal Status [phone]): At least 1 percent but less than 20 percent impaired, limited or restricted Mobility: Walking and Moving Around Discharge Status [phone]): At least 1 percent but less than 20 percent impaired, limited or restricted   [facility] 03/09/2014, 12:54 PM

## 2014-03-09 NOTE — Progress Notes (Signed)
Pt stable, scripts, d/c instructions given with no questions/concerns voiced by pt or wife.  Pt transported via wheelchair to private vehicle by NT and wife.

## 2014-03-09 NOTE — Op Note (Signed)
NAMEJOHNDAVID, GERALDS             ACCOUNT NO.:  [id number]  MEDICAL RECORD NO.:  [id number]  LOCATION:  3                         FACILITY:  [facility]  PHYSICIAN:  Lind Guest. Ninfa Linden, M.D.DATE OF BIRTH:  [birth year]  DATE OF PROCEDURE:  03/08/2014 DATE OF DISCHARGE:                              OPERATIVE REPORT   PREOPERATIVE DIAGNOSES: 1. Status post highly comminuted right tibial plateau fracture status     post open reduction and internal fixation of this fracture. 2. Status post postoperative infection with irrigation and debridement     and long-term antibiotics. 3. Recurrent right knee effusion with likely cartilage damage to the     knee joint. 4. Chronic draining wound, right proximal tibia.  POSTOPERATIVE DIAGNOSES: 1. Status post highly comminuted right tibial plateau fracture, status     post open reduction and internal fixation of this fracture. 2. Status post postoperative infection with irrigation and debridement     and long-term antibiotics. 3. Recurrent right knee effusion with likely cartilage damage to the     knee joint. 4. Chronic draining wound, right proximal tibia.  PROCEDURE PERFORMED: 1. Right knee arthroscopy with extensive debridement of the knee     joint. 2. Removal of retained hardware, right proximal tibia.  FINDINGS: 1. No gross purulence along right tibia. 2. 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  SURGEON:  Lind Guest. Ninfa Linden, M.D.  ASSISTING:  Erskine Emery, PA-C  ANESTHESIA:  General.  BLOOD LOSS:  Less than 100 mL.  TOURNIQUET TIME:  Less than 2 hours.  COMPLICATIONS:  None.  INDICATIONS:  Roger Ayers is a 48 year old gentleman well known to me.  In November of 2014, he sustained a mechanical fall of approximately 10 feet off a ladder and suffered a severe comminuted right tibial plateau fracture.  The day of his injury, he underwent external fixation to improve alignment and by the end of the  week when the soft tissues swelling had subsided, he underwent open reduction and internal fixation of this fracture as well as trying to repair the knee joint itself. Then postoperatively, he developed infection of the right proximal tibia with MRSA, he underwent repeat irrigation and debridement and antibiotic bead placement x2 and was on 6 weeks of IV antibiotics followed by oral antibiotics.  He has since demonstrated ability to heal the fracture but he has a chronic draining sinus wound distal to the knee joint and recurrent effusions of the right knee.  At this point, he presents for right knee arthroscopy as well as removal from the hardware because he has got radiographic evidence of healing plus the fact that I think that the retained whole part was contributing to not healing distally.  The risks and benefits of surgery explained to him in detail and he did wish to proceed.  PROCEDURE DESCRIPTION:  After informed consent was obtained and appropriate right leg was marked.  He was brought to the operating room and placed supine on the operating table.  General anesthesia was then obtained.  A nonsterile tourniquet placed around his upper right thigh. His right leg was prepped and draped with DuraPrep and sterile drapes including a sterile stockinette with the bed raised  and the lateral leg post utilized.  A time-out was called.  He was identified as correct patient, correct right knee, and proximal tibia.  I then made an anterolateral arthroscopy portal, inserted a cannula in the knee and drained effusion from the knee, but there was no purulence.  I then placed an anterior medial incision and placed arthroscopic shaver and carried out extensive debridement in all 3 compartments of his leg.  I found both meniscus to be decently intact with no gross tears, and the ACL and PCL were intact.  The lateral compartment had extensive cartilage loss along the tibial plateau and there was  some thinning of the cartilage on the femoral condyle but overall, the knee looked in good shape.  There was an area in the trochlear groove with full- thickness cartilage loss.  After debriding extensively, I allowed fluid to lavage the knee and then removed all instrumentation from the knee. We closed the 3 portal sites with nylon suture.  We then made incision along his previous incision on the lateral aspect of the leg and excised out a sinus tract but found no gross purulence.  I was able to remove the plate in its entirety and screws in its entirety.  We then removed any kind of necrotic tissue from deep in the tissue but again found no gross purulence.  We then placed 3 L of normal saline solution through the wound itself and I stressed the knee and it appears that the fracture has healed for the most part.  We then reapproximated the deep tissue with 0 Vicryl followed by 2-0 Vicryl to subcutaneous tissue and interrupted 2-0 nylon on the skin.  Xeroform and a well-padded sterile dressing was applied.  He was awakened, extubated, and taken to recovery room in stable condition.  All final counts were correct, there were no complications noted.  Postoperatively, I am going to put him back to protect weightbearing and admit him overnight for IV antibiotics with a discharge to home tomorrow.     Lind Guest. Ninfa Linden, M.D.     CYB/MEDQ  D:  03/08/2014  T:  03/09/2014  Job:  [id number]

## 2014-03-09 NOTE — Discharge Summary (Signed)
Patient ID: Roger Ayers MRN: [id number] DOB/AGE: [birth year] 48 y.o.  Admit date: 03/08/2014 Discharge date: 03/09/2014  Admission Diagnoses:  Principal Problem:   Retained orthopedic hardware right tibia; previous tibial plateau fracture Active Problems:   S/P right knee arthroscopy   Discharge Diagnoses:  Same  Past Medical History  Diagnosis Date  . Hypertension   . GERD (gastroesophageal reflux disease)   . Sleep apnea     cpap    Surgeries: Procedure(s):  arthroscopy RIGHT KNEE WITH EXTENSIVE DEBRIDEMENT Removal plate and screws right tibia, on 03/08/2014   Consultants:  PT  Discharged Condition: Improved  Hospital Course: Roger Ayers is an 48 y.o. male who was admitted 03/08/2014 for operative treatment ofRetained orthopedic hardware. Patient has severe unremitting pain that affects sleep, daily activities, and work/hobbies. After pre-op clearance the patient was taken to the operating room on 03/08/2014 and underwent  Procedure(s):  arthroscopy RIGHT KNEE WITH EXTENSIVE DEBRIDEMENT Removal plate and screws right tibia,.    Patient was given perioperative antibiotics: Anti-infectives   Start     Dose/Rate Route Frequency Ordered Stop   03/08/14 2200  vancomycin (VANCOCIN) IVPB 1000 mg/200 mL premix     1,000 mg 200 mL/hr over 60 Minutes Intravenous Every 12 hours 03/08/14 1329 03/08/14 2205   03/08/14 0800  clindamycin (CLEOCIN) IVPB 900 mg     900 mg 100 mL/hr over 30 Minutes Intravenous On call to O.R. 03/08/14 0739 03/08/14 1038       Patient was given sequential compression devices, early ambulation, and chemoprophylaxis to prevent DVT.  Patient benefited maximally from hospital stay and there were no complications.    Recent vital signs: Patient Vitals for the past 24 hrs:  BP Temp Temp src Pulse Resp SpO2 Height Weight  03/09/14 1000 102/53 mmHg 98.5 F (36.9 C) Oral 67 18 96 % - -  03/09/14 0620 104/61 mmHg 97.4 F (36.3 C) Oral 67 18 97 % - -   03/09/14 0206 109/59 mmHg 98.5 F (36.9 C) Oral 70 18 97 % - -  03/08/14 2100 110/67 mmHg 98.3 F (36.8 C) Oral 73 18 98 % - -  03/08/14 1800 113/70 mmHg 97.3 F (36.3 C) - 73 16 95 % - -  03/08/14 1620 120/70 mmHg 97.6 F (36.4 C) - 71 16 98 % - -  03/08/14 1520 111/69 mmHg 97.8 F (36.6 C) Axillary 64 18 94 % - -  03/08/14 1420 115/73 mmHg 97.7 F (36.5 C) - 77 16 97 % - -  03/08/14 1323 134/87 mmHg 98.3 F (36.8 C) - 81 16 99 % 5' 10.5" (1.791 m) 104.8 kg (231 lb 0.7 oz)  03/08/14 1300 132/88 mmHg 98.1 F (36.7 C) - 80 10 99 % - -  03/08/14 1245 146/89 mmHg - - 80 12 99 % - -  03/08/14 1230 143/88 mmHg - - 77 10 - - -  03/08/14 1217 159/96 mmHg 97.9 F (36.6 C) - 83 8 100 % - -     Recent laboratory studies: No results found for this basename: WBC, HGB, HCT, PLT, NA, K, CL, CO2, BUN, CREATININE, GLUCOSE, PT, INR, CALCIUM, 2,  in the last 72 hours   Discharge Medications:     Medication List    STOP taking these medications       naproxen sodium 220 MG tablet  Commonly known as:  ANAPROX      TAKE these medications       aspirin EC 325 MG tablet  Take 1 tablet (325 mg total) by mouth daily.     docusate sodium 100 MG capsule  Commonly known as:  COLACE  Take 100 mg by mouth 2 (two) times daily as needed for mild constipation.     lisinopril 5 MG tablet  Commonly known as:  PRINIVIL,ZESTRIL  Take 5 mg by mouth every morning.     multivitamin with minerals Tabs tablet  Take 1 tablet by mouth daily.     omeprazole 20 MG capsule  Commonly known as:  PRILOSEC  Take 20 mg by mouth every morning.     oxyCODONE-acetaminophen 5-325 MG per tablet  Commonly known as:  PERCOCET/ROXICET  Take 1-2 tablets by mouth every 4 (four) hours as needed for severe pain.     sertraline 50 MG tablet  Commonly known as:  ZOLOFT  Take 50 mg by mouth every morning.     sulfamethoxazole-trimethoprim 800-160 MG per tablet  Commonly known as:  BACTRIM DS  Take 2 tablets by mouth  2 (two) times daily.        Diagnostic Studies: Dg Knee Right Port  03/08/2014   CLINICAL DATA:  Postoperative study of the right knee  EXAM: PORTABLE RIGHT KNEE - 1-2 VIEW  COMPARISON:  DG KNEE*R*PORT dated 11/23/2013  FINDINGS: The patient has undergone removal of the hardware previously placed for fixation of a proximal tibial metaphysis seal fracture. The lateral aspect of the tibial metaphysis demonstrates heterogeneously increased density. The tibial plateau remains disrupted. Lucency through the tibial plateau is visible and cortical disruption along the lateral aspect of the tibial metaphysis is demonstrated. There is deformity of the adjacent fibula consistent with previous fracture. The distal femur appears intact. No definite joint effusion is demonstrated.  IMPRESSION: The patient has undergone removal of an orthopedic side plate with cortical screws originally placed through the metaphysis and proximal diaphysis of the tibia for ORIF upper proximal metaphyseal fracture. The density of the bone is heterogeneous which may indicate the clinically known infection. Nonunion of the fractures is suspected.   Electronically Signed   By: David  Martinique   On: 03/08/2014 13:03    Disposition: 06-Home-Health Care Svc      Discharge Orders   Future Appointments Provider Department Dept Phone   04/22/2014 3:45 PM Campbell Riches, MD [facility] [phone]   Future Orders Complete By Expires   Discharge wound care:  As directed    Comments:     May shower with dressing intact.Keep dressing cleanand intact until Wednesday, then remove and shower. Apply clean dressing after showering.   Elevate operative extremity  As directed    Comments:     Encourage patient to wiggle toes often   Non weight bearing  As directed    Comments:     Non weight bearing right leg touch down  for balance   Questions:     Laterality:  right   Extremity:  Lower       Follow-up Information   Follow up with Mcarthur Rossetti, MD. Schedule an appointment as soon as possible for a visit in 2 weeks.   Specialty:  Orthopedic Surgery   Contact information:   [place] [address] [phone]        Signed: Erskine Emery 03/09/2014, 10:59 AM

## 2014-03-11 ENCOUNTER — Encounter (HOSPITAL_COMMUNITY): Payer: Self-pay | Admitting: Orthopaedic Surgery

## 2014-03-19 ENCOUNTER — Encounter: Payer: Self-pay | Admitting: Infectious Diseases

## 2014-04-18 ENCOUNTER — Encounter: Payer: Self-pay | Admitting: Infectious Diseases

## 2014-04-22 ENCOUNTER — Encounter: Payer: Self-pay | Admitting: Infectious Diseases

## 2014-04-22 ENCOUNTER — Ambulatory Visit (INDEPENDENT_AMBULATORY_CARE_PROVIDER_SITE_OTHER): Payer: BC Managed Care – PPO | Admitting: Infectious Diseases

## 2014-04-22 VITALS — BP 132/84 | HR 76 | Temp 98.2°F | Ht 71.0 in | Wt 240.0 lb

## 2014-04-22 DIAGNOSIS — M869 Osteomyelitis, unspecified: Secondary | ICD-10-CM

## 2014-04-22 NOTE — Progress Notes (Signed)
   Subjective:    Patient ID: Roger Ayers, male    DOB: 1965-12-28, 48 y.o.   MRN: [id number]  HPI 48 yo M with hx falling 10' fall from a ladder and landing on his feet on 11-17-13. He developed severe R knee pain. He underwent repair with external fixation of R knee bicondylar tibial plateau fracture with severe comminution on 11-17-13. On 11-28 he underwent removal of fixator and had ORIF with plate, screws and bone chips as filler. He was d/c home 11-25-13.  He returned 12-10 with fever, pain and redness of his knee for 3 days as well as (bloody) drainage from his wound. He temps to 101 at home. In ED  WBC 24k and he was hyponatremic. He was taken to the OR and underwent I & D. He was started on vanco/zoyn.  His wound cx grew abundant MRSA. He was d/c home on vanco/rifampin on 12-10-13. He completed this on 01-09-14 and was changed to bactrim. He continued to have wound d/c and was taken to OR on 03-08-14 and hardware removed.  03-05-14 CRP and ESR normal.  His wound has since closed. His wt bearing has been "not too bad" still using cane. No fever or chills. Has lost 40# due to dysgusia while on anbx.  Has been on bactrim since surgery.    Review of Systems     Objective:   Physical Exam  Constitutional: He appears well-developed and well-nourished.  Musculoskeletal:       Legs:         Assessment & Plan:

## 2014-04-22 NOTE — Assessment & Plan Note (Signed)
He appears to be doing well. Will not recheck his ESR and CRP now as they were previously normal. Will stop his bactrim now. Have asked him to rtc if he has any f/c, swelling, wound breakdown.

## 2014-04-22 NOTE — Addendum Note (Signed)
Addended by: Adilynn Bessey C on: 04/22/2014 04:21 PM   Modules accepted: Orders, Medications

## 2014-10-17 ENCOUNTER — Other Ambulatory Visit (HOSPITAL_COMMUNITY): Payer: Self-pay | Admitting: Orthopaedic Surgery

## 2014-12-13 NOTE — Pre-Procedure Instructions (Signed)
Roger Ayers  12/13/2014   Your procedure is scheduled on:  Tuesday, December 29.  Report to [facility] Admitting at 11:00AM.  Call this number if you have problems the morning of surgery: [phone]   Remember:   Do not eat food or drink liquids after midnight Monday, December 28.   Take these medicines the morning of surgery with A SIP OF WATER: omeprazole (PRILOSEC).                May take pain medication if needed.              Stop taking Aspirin and Vitamins 1 week prior to surgery.   Do not wear jewelry, make-up or nail polish.  Do not wear lotions, powders, or perfumes.             Men may shave face and neck.  Do not bring valuables to the             [facility] is not responsible for any belongings or valuables.               Contacts, dentures or bridgework may not be worn into surgery.  Leave suitcase in the car. After surgery it may be brought to your room.  For patients admitted to the hospital, discharge time is determined by your  treatment team.               Patients discharged the day of surgery will not be allowed to drive home.  Name and phone number of your driver: -   Special Instructions: Review  [facility] - Preparing For Surgery.   Please read over the following fact sheets that you were given: Pain Booklet, Coughing and Deep Breathing and Surgical Site Infection Prevention

## 2014-12-16 ENCOUNTER — Encounter (HOSPITAL_COMMUNITY): Payer: Self-pay

## 2014-12-16 ENCOUNTER — Encounter (HOSPITAL_COMMUNITY)
Admission: RE | Admit: 2014-12-16 | Discharge: 2014-12-16 | Disposition: A | Discharge status: Home / Self Care | Payer: BC Managed Care – PPO | Source: Ambulatory Visit | Attending: Orthopaedic Surgery | Admitting: Orthopaedic Surgery

## 2014-12-16 DIAGNOSIS — Z01818 Encounter for other preprocedural examination: Secondary | ICD-10-CM | POA: Insufficient documentation

## 2014-12-16 DIAGNOSIS — M1990 Unspecified osteoarthritis, unspecified site: Secondary | ICD-10-CM | POA: Insufficient documentation

## 2014-12-16 DIAGNOSIS — I4519 Other right bundle-branch block: Secondary | ICD-10-CM | POA: Insufficient documentation

## 2014-12-16 DIAGNOSIS — F17200 Nicotine dependence, unspecified, uncomplicated: Secondary | ICD-10-CM | POA: Insufficient documentation

## 2014-12-16 DIAGNOSIS — K219 Gastro-esophageal reflux disease without esophagitis: Secondary | ICD-10-CM | POA: Diagnosis not present

## 2014-12-16 DIAGNOSIS — Z8614 Personal history of Methicillin resistant Staphylococcus aureus infection: Secondary | ICD-10-CM | POA: Insufficient documentation

## 2014-12-16 DIAGNOSIS — G4733 Obstructive sleep apnea (adult) (pediatric): Secondary | ICD-10-CM | POA: Diagnosis not present

## 2014-12-16 DIAGNOSIS — I10 Essential (primary) hypertension: Secondary | ICD-10-CM | POA: Diagnosis not present

## 2014-12-16 DIAGNOSIS — E669 Obesity, unspecified: Secondary | ICD-10-CM | POA: Insufficient documentation

## 2014-12-16 HISTORY — DX: Unspecified osteoarthritis, unspecified site: M19.90

## 2014-12-16 HISTORY — DX: Methicillin resistant Staphylococcus aureus infection, unspecified site: A49.02

## 2014-12-16 LAB — CBC
HCT: 45.1 % (ref 39.0–52.0)
HEMOGLOBIN: 15.3 g/dL (ref 13.0–17.0)
MCH: 31.4 pg (ref 26.0–34.0)
MCHC: 33.9 g/dL (ref 30.0–36.0)
MCV: 92.6 fL (ref 78.0–100.0)
Platelets: 226 10*3/uL (ref 150–400)
RBC: 4.87 MIL/uL (ref 4.22–5.81)
RDW: 12.6 % (ref 11.5–15.5)
WBC: 6.4 10*3/uL (ref 4.0–10.5)

## 2014-12-16 LAB — URINALYSIS, ROUTINE W REFLEX MICROSCOPIC
BILIRUBIN URINE: NEGATIVE
Glucose, UA: NEGATIVE mg/dL
HGB URINE DIPSTICK: NEGATIVE
Ketones, ur: NEGATIVE mg/dL
Leukocytes, UA: NEGATIVE
Nitrite: NEGATIVE
PH: 6 (ref 5.0–8.0)
PROTEIN: NEGATIVE mg/dL
Specific Gravity, Urine: 1.015 (ref 1.005–1.030)
Urobilinogen, UA: 1 mg/dL (ref 0.0–1.0)

## 2014-12-16 LAB — PROTIME-INR
INR: 1.02 (ref 0.00–1.49)
Prothrombin Time: 13.5 seconds (ref 11.6–15.2)

## 2014-12-16 LAB — APTT: APTT: 32 s (ref 24–37)

## 2014-12-16 LAB — BASIC METABOLIC PANEL
Anion gap: 12 (ref 5–15)
BUN: 13 mg/dL (ref 6–23)
CHLORIDE: 105 meq/L (ref 96–112)
CO2: 27 mEq/L (ref 19–32)
Calcium: 9.4 mg/dL (ref 8.4–10.5)
Creatinine, Ser: 0.94 mg/dL (ref 0.50–1.35)
GFR calc Af Amer: >90 mL/min (ref >90)
GFR calc non Af Amer: >90 mL/min (ref >90)
GLUCOSE: 102 mg/dL — AB (ref 70–99)
Potassium: 4.6 mEq/L (ref 3.7–5.3)
Sodium: 144 mEq/L (ref 137–147)

## 2014-12-16 LAB — SURGICAL PCR SCREEN
MRSA, PCR: NEGATIVE
Staphylococcus aureus: NEGATIVE

## 2014-12-16 NOTE — Progress Notes (Signed)
PCP is Marton Redwood. Patient denied having any acute cardiac or pulmonary issues. Patient informed Nurse that he has sleep apnea and wears CPAP nightly. Nurse instructed patient to bring CPAP mask DOS. Patient verbalized understanding.

## 2014-12-17 NOTE — Progress Notes (Signed)
Anesthesia Chart Review: Patient is a 48 year old male posted for right TKA on 12/14/14 by Dr. Jean Rosenthal.  History includes smoking, HTN, GERD, OSA with CPAP use, arthritis, RLE fracture s/p multiple surgeries and MRSA infection, oral surgery (bone grafts for implants '14),  BMI is consistent with obesity. PCP is listed as Dr. Marton Redwood.  EKG on 12/16/14 showed NSR, incomplete right BBB, LAD.  EKG was not felt significantly changed since prior tracing on 12/05/13.  Preoperative labs noted. T&S was not ordered by the surgeon.  If no acute changes then I anticipate that he can proceed as planned.  [facility] Short Stay Center/Anesthesiology Phone [phone] 12/17/2014 12:01 PM

## 2014-12-23 MED ORDER — CEFAZOLIN SODIUM-DEXTROSE 2-3 GM-% IV SOLR
2.0000 g | INTRAVENOUS | Status: AC
  Administered 2014-12-24: 2 g via INTRAVENOUS
  Filled 2014-12-23: qty 50

## 2014-12-23 NOTE — Progress Notes (Signed)
Patient called to arrive at 1030 for surgery

## 2014-12-24 ENCOUNTER — Inpatient Hospital Stay (HOSPITAL_COMMUNITY): Payer: BC Managed Care – PPO

## 2014-12-24 ENCOUNTER — Inpatient Hospital Stay (HOSPITAL_COMMUNITY)
Admission: RE | Admit: 2014-12-24 | Discharge: 2014-12-26 | DRG: 470 | Disposition: A | Discharge status: Home Health | Payer: BC Managed Care – PPO | Source: Ambulatory Visit | Attending: Orthopaedic Surgery | Admitting: Orthopaedic Surgery

## 2014-12-24 ENCOUNTER — Encounter (HOSPITAL_COMMUNITY)
Admission: RE | Disposition: A | Discharge status: Home Health | Payer: Self-pay | Source: Ambulatory Visit | Attending: Orthopaedic Surgery

## 2014-12-24 ENCOUNTER — Inpatient Hospital Stay (HOSPITAL_COMMUNITY): Payer: BC Managed Care – PPO | Admitting: Certified Registered Nurse Anesthetist

## 2014-12-24 ENCOUNTER — Inpatient Hospital Stay (HOSPITAL_COMMUNITY): Payer: BC Managed Care – PPO | Admitting: Vascular Surgery

## 2014-12-24 ENCOUNTER — Encounter (HOSPITAL_COMMUNITY): Payer: Self-pay | Admitting: Anesthesiology

## 2014-12-24 DIAGNOSIS — Z96651 Presence of right artificial knee joint: Secondary | ICD-10-CM

## 2014-12-24 DIAGNOSIS — M1731 Unilateral post-traumatic osteoarthritis, right knee: Principal | ICD-10-CM | POA: Diagnosis present

## 2014-12-24 DIAGNOSIS — Z7982 Long term (current) use of aspirin: Secondary | ICD-10-CM | POA: Diagnosis not present

## 2014-12-24 DIAGNOSIS — I10 Essential (primary) hypertension: Secondary | ICD-10-CM | POA: Diagnosis present

## 2014-12-24 DIAGNOSIS — K219 Gastro-esophageal reflux disease without esophagitis: Secondary | ICD-10-CM | POA: Diagnosis present

## 2014-12-24 DIAGNOSIS — Z8614 Personal history of Methicillin resistant Staphylococcus aureus infection: Secondary | ICD-10-CM | POA: Diagnosis not present

## 2014-12-24 DIAGNOSIS — F17210 Nicotine dependence, cigarettes, uncomplicated: Secondary | ICD-10-CM | POA: Diagnosis present

## 2014-12-24 DIAGNOSIS — G4730 Sleep apnea, unspecified: Secondary | ICD-10-CM | POA: Diagnosis present

## 2014-12-24 DIAGNOSIS — T1490 Injury, unspecified: Secondary | ICD-10-CM | POA: Diagnosis present

## 2014-12-24 DIAGNOSIS — M25561 Pain in right knee: Secondary | ICD-10-CM | POA: Diagnosis present

## 2014-12-24 DIAGNOSIS — M12561 Traumatic arthropathy, right knee: Secondary | ICD-10-CM

## 2014-12-24 HISTORY — PX: TOTAL KNEE ARTHROPLASTY: SHX125

## 2014-12-24 LAB — GLUCOSE, CAPILLARY: Glucose-Capillary: 152 mg/dL — ABNORMAL HIGH (ref 70–99)

## 2014-12-24 SURGERY — ARTHROPLASTY, KNEE, TOTAL
Anesthesia: General | Site: Knee | Laterality: Right

## 2014-12-24 MED ORDER — METHOCARBAMOL 1000 MG/10ML IJ SOLN
500.0000 mg | Freq: Four times a day (QID) | INTRAVENOUS | Status: DC | PRN
  Administered 2014-12-24: 500 mg via INTRAVENOUS
  Filled 2014-12-24 (×2): qty 5

## 2014-12-24 MED ORDER — DIPHENHYDRAMINE HCL 12.5 MG/5ML PO ELIX: 12.5000 mg | ORAL_SOLUTION | ORAL | Status: DC | PRN

## 2014-12-24 MED ORDER — METOCLOPRAMIDE HCL 10 MG PO TABS: 5.0000 mg | ORAL_TABLET | Freq: Three times a day (TID) | ORAL | Status: DC | PRN

## 2014-12-24 MED ORDER — HYDROMORPHONE HCL 1 MG/ML IJ SOLN
1.0000 mg | INTRAMUSCULAR | Status: DC | PRN
  Administered 2014-12-24 – 2014-12-26 (×8): 1 mg via INTRAVENOUS
  Filled 2014-12-24 (×8): qty 1

## 2014-12-24 MED ORDER — PROPOFOL 10 MG/ML IV BOLUS
INTRAVENOUS | Status: AC
  Filled 2014-12-24: qty 20

## 2014-12-24 MED ORDER — PHENOL 1.4 % MT LIQD: 1.0000 | OROMUCOSAL | Status: DC | PRN

## 2014-12-24 MED ORDER — ROCURONIUM BROMIDE 50 MG/5ML IV SOLN
INTRAVENOUS | Status: AC
  Filled 2014-12-24: qty 1

## 2014-12-24 MED ORDER — SODIUM CHLORIDE 0.9 % IV SOLN
INTRAVENOUS | Status: DC
  Administered 2014-12-25: 07:00:00 via INTRAVENOUS

## 2014-12-24 MED ORDER — PANTOPRAZOLE SODIUM 40 MG PO TBEC
40.0000 mg | DELAYED_RELEASE_TABLET | Freq: Every day | ORAL | Status: DC
  Administered 2014-12-25 – 2014-12-26 (×2): 40 mg via ORAL
  Filled 2014-12-24 (×2): qty 1

## 2014-12-24 MED ORDER — ONDANSETRON HCL 4 MG/2ML IJ SOLN
INTRAMUSCULAR | Status: AC
  Filled 2014-12-24: qty 2

## 2014-12-24 MED ORDER — ACETAMINOPHEN 325 MG PO TABS: 650.0000 mg | ORAL_TABLET | Freq: Four times a day (QID) | ORAL | Status: DC | PRN

## 2014-12-24 MED ORDER — CLINDAMYCIN PHOSPHATE 600 MG/50ML IV SOLN
600.0000 mg | Freq: Four times a day (QID) | INTRAVENOUS | Status: AC
  Administered 2014-12-24 – 2014-12-25 (×2): 600 mg via INTRAVENOUS
  Filled 2014-12-24 (×2): qty 50

## 2014-12-24 MED ORDER — ONDANSETRON HCL 4 MG/2ML IJ SOLN
INTRAMUSCULAR | Status: DC | PRN
  Administered 2014-12-24: 4 mg via INTRAVENOUS

## 2014-12-24 MED ORDER — HYDROMORPHONE HCL 1 MG/ML IJ SOLN
INTRAMUSCULAR | Status: AC
  Filled 2014-12-24: qty 1

## 2014-12-24 MED ORDER — RIVAROXABAN 10 MG PO TABS
10.0000 mg | ORAL_TABLET | Freq: Every day | ORAL | Status: DC
  Administered 2014-12-25 – 2014-12-26 (×2): 10 mg via ORAL
  Filled 2014-12-24 (×4): qty 1

## 2014-12-24 MED ORDER — ACETAMINOPHEN 10 MG/ML IV SOLN
INTRAVENOUS | Status: AC
  Filled 2014-12-24: qty 100

## 2014-12-24 MED ORDER — ROPINIROLE HCL 0.5 MG PO TABS
0.5000 mg | ORAL_TABLET | Freq: Every day | ORAL | Status: DC
  Administered 2014-12-24 – 2014-12-25 (×2): 0.5 mg via ORAL
  Filled 2014-12-24 (×3): qty 1

## 2014-12-24 MED ORDER — LACTATED RINGERS IV SOLN
INTRAVENOUS | Status: DC
  Administered 2014-12-24: 11:00:00 via INTRAVENOUS

## 2014-12-24 MED ORDER — METHOCARBAMOL 500 MG PO TABS
500.0000 mg | ORAL_TABLET | Freq: Four times a day (QID) | ORAL | Status: DC | PRN
  Administered 2014-12-24 – 2014-12-26 (×6): 500 mg via ORAL
  Filled 2014-12-24 (×8): qty 1

## 2014-12-24 MED ORDER — MENTHOL 3 MG MT LOZG: 1.0000 | LOZENGE | OROMUCOSAL | Status: DC | PRN

## 2014-12-24 MED ORDER — HYDROMORPHONE HCL 1 MG/ML IJ SOLN
0.2500 mg | INTRAMUSCULAR | Status: DC | PRN
  Administered 2014-12-24: 0.25 mg via INTRAVENOUS
  Administered 2014-12-24: 0.5 mg via INTRAVENOUS
  Administered 2014-12-24: 0.25 mg via INTRAVENOUS
  Administered 2014-12-24 (×2): 0.5 mg via INTRAVENOUS

## 2014-12-24 MED ORDER — MIDAZOLAM HCL 2 MG/2ML IJ SOLN: 1.0000 mg | INTRAMUSCULAR | Status: DC | PRN

## 2014-12-24 MED ORDER — ACETAMINOPHEN 650 MG RE SUPP: 650.0000 mg | Freq: Four times a day (QID) | RECTAL | Status: DC | PRN

## 2014-12-24 MED ORDER — HYDRALAZINE HCL 20 MG/ML IJ SOLN
INTRAMUSCULAR | Status: DC | PRN
  Administered 2014-12-24 (×2): 5 mg via INTRAVENOUS

## 2014-12-24 MED ORDER — MIDAZOLAM HCL 2 MG/2ML IJ SOLN
INTRAMUSCULAR | Status: AC
  Administered 2014-12-24: 2 mg
  Filled 2014-12-24: qty 2

## 2014-12-24 MED ORDER — ONDANSETRON HCL 4 MG PO TABS: 4.0000 mg | ORAL_TABLET | Freq: Four times a day (QID) | ORAL | Status: DC | PRN

## 2014-12-24 MED ORDER — ZOLPIDEM TARTRATE 5 MG PO TABS: 5.0000 mg | ORAL_TABLET | Freq: Every evening | ORAL | Status: DC | PRN

## 2014-12-24 MED ORDER — LIDOCAINE HCL (CARDIAC) 20 MG/ML IV SOLN
INTRAVENOUS | Status: AC
Start: 2014-12-24 — End: 2014-12-24
  Filled 2014-12-24: qty 5

## 2014-12-24 MED ORDER — FENTANYL CITRATE 0.05 MG/ML IJ SOLN
INTRAMUSCULAR | Status: AC
  Filled 2014-12-24: qty 5

## 2014-12-24 MED ORDER — FENTANYL CITRATE 0.05 MG/ML IJ SOLN
INTRAMUSCULAR | Status: DC | PRN
  Administered 2014-12-24 (×3): 50 ug via INTRAVENOUS

## 2014-12-24 MED ORDER — LACTATED RINGERS IV SOLN
INTRAVENOUS | Status: DC | PRN
  Administered 2014-12-24: 11:00:00 via INTRAVENOUS

## 2014-12-24 MED ORDER — LISINOPRIL 5 MG PO TABS
5.0000 mg | ORAL_TABLET | Freq: Every morning | ORAL | Status: DC
  Administered 2014-12-25 – 2014-12-26 (×2): 5 mg via ORAL
  Filled 2014-12-24 (×2): qty 1

## 2014-12-24 MED ORDER — ONDANSETRON HCL 4 MG/2ML IJ SOLN: 4.0000 mg | Freq: Once | INTRAMUSCULAR | Status: DC | PRN

## 2014-12-24 MED ORDER — FENTANYL CITRATE 0.05 MG/ML IJ SOLN: 50.0000 ug | INTRAMUSCULAR | Status: DC | PRN

## 2014-12-24 MED ORDER — OXYCODONE HCL 5 MG PO TABS
ORAL_TABLET | ORAL | Status: AC
  Filled 2014-12-24: qty 2

## 2014-12-24 MED ORDER — ALUM & MAG HYDROXIDE-SIMETH 200-200-20 MG/5ML PO SUSP
30.0000 mL | ORAL | Status: DC | PRN
  Administered 2014-12-25: 30 mL via ORAL
  Filled 2014-12-24: qty 30

## 2014-12-24 MED ORDER — SODIUM CHLORIDE 0.9 % IR SOLN
Status: DC | PRN
  Administered 2014-12-24: 3000 mL

## 2014-12-24 MED ORDER — ONDANSETRON HCL 4 MG/2ML IJ SOLN: 4.0000 mg | Freq: Four times a day (QID) | INTRAMUSCULAR | Status: DC | PRN

## 2014-12-24 MED ORDER — ADULT MULTIVITAMIN W/MINERALS CH
1.0000 | ORAL_TABLET | Freq: Every day | ORAL | Status: DC
  Administered 2014-12-25 – 2014-12-26 (×2): 1 via ORAL
  Filled 2014-12-24 (×2): qty 1

## 2014-12-24 MED ORDER — OXYCODONE HCL ER 20 MG PO T12A
20.0000 mg | EXTENDED_RELEASE_TABLET | Freq: Two times a day (BID) | ORAL | Status: DC
  Administered 2014-12-24 – 2014-12-26 (×4): 20 mg via ORAL
  Filled 2014-12-24 (×4): qty 1

## 2014-12-24 MED ORDER — OXYCODONE HCL 5 MG PO TABS
5.0000 mg | ORAL_TABLET | ORAL | Status: DC | PRN
  Administered 2014-12-24 (×2): 15 mg via ORAL
  Administered 2014-12-24: 10 mg via ORAL
  Administered 2014-12-25 – 2014-12-26 (×7): 15 mg via ORAL
  Administered 2014-12-26: 10 mg via ORAL
  Filled 2014-12-24 (×10): qty 3
  Filled 2014-12-24: qty 2

## 2014-12-24 MED ORDER — DOCUSATE SODIUM 100 MG PO CAPS
100.0000 mg | ORAL_CAPSULE | Freq: Two times a day (BID) | ORAL | Status: DC
  Administered 2014-12-24 – 2014-12-26 (×4): 100 mg via ORAL
  Filled 2014-12-24 (×6): qty 1

## 2014-12-24 MED ORDER — ACETAMINOPHEN 10 MG/ML IV SOLN
1000.0000 mg | Freq: Four times a day (QID) | INTRAVENOUS | Status: DC
  Administered 2014-12-24: 1000 mg via INTRAVENOUS

## 2014-12-24 MED ORDER — FENTANYL CITRATE 0.05 MG/ML IJ SOLN
INTRAMUSCULAR | Status: AC
  Administered 2014-12-24: 100 ug
  Filled 2014-12-24: qty 2

## 2014-12-24 MED ORDER — PROPOFOL INFUSION 10 MG/ML OPTIME
INTRAVENOUS | Status: DC | PRN
  Administered 2014-12-24: 140 ug/kg/min via INTRAVENOUS

## 2014-12-24 MED ORDER — METOCLOPRAMIDE HCL 5 MG/ML IJ SOLN: 5.0000 mg | Freq: Three times a day (TID) | INTRAMUSCULAR | Status: DC | PRN

## 2014-12-24 SURGICAL SUPPLY — 69 items
BANDAGE ELASTIC 6 VELCRO ST LF (GAUZE/BANDAGES/DRESSINGS) ×3 IMPLANT
BANDAGE ESMARK 6X9 LF (GAUZE/BANDAGES/DRESSINGS) ×1 IMPLANT
BLADE SAG 18X100X1.27 (BLADE) ×3 IMPLANT
BNDG ESMARK 6X9 LF (GAUZE/BANDAGES/DRESSINGS) ×3
BOWL SMART MIX CTS (DISPOSABLE) IMPLANT
CAPT KNEE TOTAL 3 ×3 IMPLANT
CEMENT BONE SIMPLEX SPEEDSET (Cement) ×9 IMPLANT
CLOSURE STERI-STRIP 1/2X4 (GAUZE/BANDAGES/DRESSINGS) ×1
CLOSURE WOUND 1/2 X4 (GAUZE/BANDAGES/DRESSINGS) ×2
CLSR STERI-STRIP ANTIMIC 1/2X4 (GAUZE/BANDAGES/DRESSINGS) ×2 IMPLANT
COVER SURGICAL LIGHT HANDLE (MISCELLANEOUS) ×3 IMPLANT
CUFF TOURNIQUET SINGLE 34IN LL (TOURNIQUET CUFF) ×3 IMPLANT
DRAPE IMP U-DRAPE 54X76 (DRAPES) ×3 IMPLANT
DRAPE INCISE IOBAN 66X45 STRL (DRAPES) ×3 IMPLANT
DRAPE ORTHO SPLIT 77X108 STRL (DRAPES) ×4
DRAPE PROXIMA HALF (DRAPES) ×3 IMPLANT
DRAPE SURG ORHT 6 SPLT 77X108 (DRAPES) ×2 IMPLANT
DRAPE U-SHAPE 47X51 STRL (DRAPES) ×3 IMPLANT
DRSG PAD ABDOMINAL 8X10 ST (GAUZE/BANDAGES/DRESSINGS) ×3 IMPLANT
DURAPREP 26ML APPLICATOR (WOUND CARE) ×3 IMPLANT
ELECT REM PT RETURN 9FT ADLT (ELECTROSURGICAL) ×3
ELECTRODE REM PT RTRN 9FT ADLT (ELECTROSURGICAL) ×1 IMPLANT
EVACUATOR 1/8 PVC DRAIN (DRAIN) IMPLANT
FACESHIELD WRAPAROUND (MASK) ×12 IMPLANT
GAUZE SPONGE 4X4 12PLY STRL (GAUZE/BANDAGES/DRESSINGS) ×3 IMPLANT
GAUZE XEROFORM 1X8 LF (GAUZE/BANDAGES/DRESSINGS) ×3 IMPLANT
GLOVE BIO SURGEON STRL SZ8 (GLOVE) ×6 IMPLANT
GLOVE BIOGEL PI IND STRL 6.5 (GLOVE) ×3 IMPLANT
GLOVE BIOGEL PI IND STRL 8 (GLOVE) ×1 IMPLANT
GLOVE BIOGEL PI INDICATOR 6.5 (GLOVE) ×6
GLOVE BIOGEL PI INDICATOR 8 (GLOVE) ×2
GLOVE ECLIPSE 7.0 STRL STRAW (GLOVE) ×3 IMPLANT
GLOVE ORTHO TXT STRL SZ7.5 (GLOVE) ×3 IMPLANT
GLOVE SURG SS PI 6.5 STRL IVOR (GLOVE) ×9 IMPLANT
GOWN STRL REUS W/ TWL LRG LVL3 (GOWN DISPOSABLE) ×2 IMPLANT
GOWN STRL REUS W/ TWL XL LVL3 (GOWN DISPOSABLE) ×4 IMPLANT
GOWN STRL REUS W/TWL LRG LVL3 (GOWN DISPOSABLE) ×4
GOWN STRL REUS W/TWL XL LVL3 (GOWN DISPOSABLE) ×8
HANDPIECE INTERPULSE COAX TIP (DISPOSABLE)
IMMOBILIZER KNEE 22 UNIV (SOFTGOODS) ×3 IMPLANT
KIT BASIN OR (CUSTOM PROCEDURE TRAY) ×3 IMPLANT
KIT ROOM TURNOVER OR (KITS) ×3 IMPLANT
MANIFOLD NEPTUNE II (INSTRUMENTS) ×3 IMPLANT
NS IRRIG 1000ML POUR BTL (IV SOLUTION) ×3 IMPLANT
PACK TOTAL JOINT (CUSTOM PROCEDURE TRAY) ×3 IMPLANT
PACK UNIVERSAL I (CUSTOM PROCEDURE TRAY) ×3 IMPLANT
PAD ABD 8X10 STRL (GAUZE/BANDAGES/DRESSINGS) ×3 IMPLANT
PAD ARMBOARD 7.5X6 YLW CONV (MISCELLANEOUS) ×6 IMPLANT
PAD CAST 4YDX4 CTTN HI CHSV (CAST SUPPLIES) ×1 IMPLANT
PADDING CAST COTTON 4X4 STRL (CAST SUPPLIES) ×2
PADDING CAST COTTON 6X4 STRL (CAST SUPPLIES) ×3 IMPLANT
SET HNDPC FAN SPRY TIP SCT (DISPOSABLE) IMPLANT
SET PAD KNEE POSITIONER (MISCELLANEOUS) ×3 IMPLANT
SPONGE GAUZE 4X4 12PLY STER LF (GAUZE/BANDAGES/DRESSINGS) ×3 IMPLANT
STAPLER VISISTAT 35W (STAPLE) IMPLANT
STRIP CLOSURE SKIN 1/2X4 (GAUZE/BANDAGES/DRESSINGS) ×4 IMPLANT
SUCTION FRAZIER TIP 10 FR DISP (SUCTIONS) IMPLANT
SUT VIC AB 0 CT1 27 (SUTURE) ×2
SUT VIC AB 0 CT1 27XBRD ANBCTR (SUTURE) ×1 IMPLANT
SUT VIC AB 1 CT1 27 (SUTURE) ×4
SUT VIC AB 1 CT1 27XBRD ANBCTR (SUTURE) ×2 IMPLANT
SUT VIC AB 2-0 CT1 27 (SUTURE) ×4
SUT VIC AB 2-0 CT1 TAPERPNT 27 (SUTURE) ×2 IMPLANT
SUT VIC AB 4-0 PS2 27 (SUTURE) ×3 IMPLANT
TOWEL OR 17X24 6PK STRL BLUE (TOWEL DISPOSABLE) ×3 IMPLANT
TOWEL OR 17X26 10 PK STRL BLUE (TOWEL DISPOSABLE) ×3 IMPLANT
TRAY FOLEY CATH 16FRSI W/METER (SET/KITS/TRAYS/PACK) IMPLANT
WATER STERILE IRR 1000ML POUR (IV SOLUTION) ×6 IMPLANT
WRAP KNEE MAXI GEL POST OP (GAUZE/BANDAGES/DRESSINGS) ×3 IMPLANT

## 2014-12-24 NOTE — Anesthesia Preprocedure Evaluation (Addendum)
Anesthesia Evaluation  Patient identified by MRN, date of birth, ID band Patient awake    Reviewed: Allergy & Precautions, H&P , NPO status , Patient's Chart, lab work & pertinent test results  Airway Mallampati: II  TM Distance: >3 FB Neck ROM: Full    Dental  (+) Teeth Intact, Dental Advisory Given   Pulmonary sleep apnea , Current Smoker,  breath sounds clear to auscultation        Cardiovascular hypertension, Rhythm:Regular Rate:Normal     Neuro/Psych    GI/Hepatic GERD-  ,  Endo/Other    Renal/GU      Musculoskeletal  (+) Arthritis -,   Abdominal   Peds  Hematology   Anesthesia Other Findings   Reproductive/Obstetrics                            Anesthesia Physical Anesthesia Plan  ASA: III  Anesthesia Plan: General   Post-op Pain Management:    Induction: Intravenous  Airway Management Planned: Oral ETT  Additional Equipment:   Intra-op Plan:   Post-operative Plan: Extubation in OR  Informed Consent: I have reviewed the patients History and Physical, chart, labs and discussed the procedure including the risks, benefits and alternatives for the proposed anesthesia with the patient or authorized representative who has indicated his/her understanding and acceptance.   Dental advisory given  Plan Discussed with: CRNA, Anesthesiologist and Surgeon  Anesthesia Plan Comments: (Plan SAB with adductor canal block)       Anesthesia Quick Evaluation

## 2014-12-24 NOTE — H&P (Signed)
TOTAL KNEE ADMISSION H&P  Patient is being admitted for right total knee arthroplasty.  Subjective:  Chief Complaint:right knee pain.  HPI: Roger Ayers, 48 y.o. male, has a history of pain and functional disability in the right knee due to trauma and has failed non-surgical conservative treatments for greater than 12 weeks to includeNSAID's and/or analgesics, corticosteriod injections, flexibility and strengthening excercises, supervised PT with diminished ADL's post treatment, use of assistive devices and activity modification.  Onset of symptoms was abrupt, starting 1 years ago with gradually worsening course since that time. The patient noted prior procedures on the knee to include  arthroscopy and ORIF of a complex tibial plateau fracture on the right knee(s).  Patient currently rates pain in the right knee(s) at 10 out of 10 with activity. Patient has night pain, worsening of pain with activity and weight bearing, pain that interferes with activities of daily living, pain with passive range of motion, crepitus and joint swelling.  Patient has evidence of subchondral sclerosis and joint space narrowing by imaging studies. This patient has had proximal tibial fracture. There is no active infection.  Patient Active Problem List   Diagnosis Date Noted  . Traumatic arthritis of right knee 12/24/2014  . Retained orthopedic hardware right tibia; previous tibial plateau fracture 03/08/2014  . S/P right knee arthroscopy 03/08/2014  . Infection of right tibia post ORIF tibial plateau 12/05/2013  . Tibial plateau fracture 11/17/2013   Past Medical History  Diagnosis Date  . Hypertension   . GERD (gastroesophageal reflux disease)   . Sleep apnea     cpap  . Arthritis   . MRSA infection     right leg    Past Surgical History  Procedure Laterality Date  . Mouth surgery  2014    bone grafts for implants  . Trigger finger release Right ~2013    middle finger  . External fixation leg Right  11/17/2013    Procedure: EXTERNAL FIXATION LEG;  Surgeon: Marianna Payment, MD;  Location: [facility];  Service: Orthopedics;  Laterality: Right;  . Orif tibia plateau Right 11/23/2013    Procedure: OPEN REDUCTION INTERNAL FIXATION (ORIF) RIGHT TIBIAL PLATEAU;  Surgeon: Mcarthur Rossetti, MD;  Location: [facility];  Service: Orthopedics;  Laterality: Right;  . External fixation removal Right 11/23/2013    Procedure: REMOVAL EXTERNAL FIXATION RIGHT LEG;  Surgeon: Mcarthur Rossetti, MD;  Location: [facility];  Service: Orthopedics;  Laterality: Right;  . I&d extremity Right 12/05/2013    Procedure: IRRIGATION AND DEBRIDEMENT RIGHT LEG;  Surgeon: Mcarthur Rossetti, MD;  Location: [place];  Service: Orthopedics;  Laterality: Right;  . I&d extremity Right 12/08/2013    Procedure: Repeat IRRIGATION AND DEBRIDEMENT Right Leg with Stimulan bead placement;  Surgeon: Mcarthur Rossetti, MD;  Location: [place];  Service: Orthopedics;  Laterality: Right;  . Knee arthroscopy Right 03/08/2014    Procedure:  arthroscopy RIGHT KNEE WITH EXTENSIVE DEBRIDEMENT;  Surgeon: Mcarthur Rossetti, MD;  Location: [facility];  Service: Orthopedics;  Laterality: Right;  . Hardware removal Right 03/08/2014    Procedure: Removal plate and screws right tibia,;  Surgeon: Mcarthur Rossetti, MD;  Location: [facility];  Service: Orthopedics;  Laterality: Right;    Prescriptions prior to admission  Medication Sig Dispense Refill Last Dose  . HYDROcodone-acetaminophen (NORCO/VICODIN) 5-325 MG per tablet Take 1 tablet by mouth daily as needed (for pain).   0 Past Month at Unknown time  . lisinopril (PRINIVIL,ZESTRIL) 5 MG tablet Take  5 mg by mouth every morning.   12/24/2014 at Unknown time  . omeprazole (PRILOSEC) 20 MG capsule Take 20 mg by mouth every morning.   12/24/2014 at Unknown time  . rOPINIRole (REQUIP) 0.25 MG tablet Take 0.5 mg by mouth at bedtime.  1 12/23/2014 at Unknown time  . aspirin EC 325 MG tablet Take 1  tablet (325 mg total) by mouth daily. (Patient not taking: Reported on 12/12/2014) 30 tablet 0 Not Taking at Unknown time  . Multiple Vitamin (MULTIVITAMIN WITH MINERALS) TABS tablet Take 1 tablet by mouth daily.   More than a month at Unknown time  . oxyCODONE-acetaminophen (PERCOCET/ROXICET) 5-325 MG per tablet Take 1-2 tablets by mouth every 4 (four) hours as needed for severe pain. (Patient not taking: Reported on 12/12/2014) 60 tablet 0 Not Taking at Unknown time   Allergies  Allergen Reactions  . Thimerosal     Back when he was in high school (dicontinued med)    History  Substance Use Topics  . Smoking status: Current Every Day Smoker -- 0.50 packs/day for 20 years    Types: Cigarettes  . Smokeless tobacco: Never Used     Comment: e-cigs  . Alcohol Use: 0.0 oz/week     Comment: I beer per day    Family History  Problem Relation Age of Onset  . Stroke Father     47s     Review of Systems  Musculoskeletal: Positive for joint pain.  All other systems reviewed and are negative.   Objective:  Physical Exam  Constitutional: He is oriented to person, place, and time. He appears well-developed and well-nourished.  HENT:  Head: Normocephalic and atraumatic.  Eyes: EOM are normal. Pupils are equal, round, and reactive to light.  Neck: Normal range of motion. Neck supple.  Cardiovascular: Normal rate and regular rhythm.   Respiratory: Effort normal and breath sounds normal.  GI: Soft. Bowel sounds are normal.  Musculoskeletal:       Right knee: He exhibits decreased range of motion, swelling, effusion, deformity and bony tenderness. Tenderness found. Medial joint line and lateral joint line tenderness noted.  Neurological: He is alert and oriented to person, place, and time.  Skin: Skin is warm and dry.  Psychiatric: He has a normal mood and affect.    Vital signs in last 24 hours: Temp:  [97.9 F (36.6 C)] 97.9 F (36.6 C) (12/29 1046) Pulse Rate:  [77] 77 (12/29  1046) BP: (169)/(103) 169/103 mmHg (12/29 1046) SpO2:  [98 %] 98 % (12/29 1046) Weight:  [115.667 kg (255 lb)] 115.667 kg (255 lb) (12/29 1046)  Labs:   Estimated body mass index is 35.58 kg/(m^2) as calculated from the following:   Height as of 12/16/14: 5\' 11"  (1.803 m).   Weight as of this encounter: 115.667 kg (255 lb).   Imaging Review Plain radiographs demonstrate moderate post-traumatic arthritis of the right knee(s). The overall alignment is mild varus. The bone quality appears to be good for age and reported activity level.  Assessment/Plan:  End stage arthritis, right knee   The patient history, physical examination, clinical judgment of the provider and imaging studies are consistent with end stage degenerative joint disease of the right knee(s) and total knee arthroplasty is deemed medically necessary. The treatment options including medical management, injection therapy arthroscopy and arthroplasty were discussed at length. The risks and benefits of total knee arthroplasty were presented and reviewed. The risks due to aseptic loosening, infection, stiffness, patella tracking problems, thromboembolic  complications and other imponderables were discussed. The patient acknowledged the explanation, agreed to proceed with the plan and consent was signed. Patient is being admitted for inpatient treatment for surgery, pain control, PT, OT, prophylactic antibiotics, VTE prophylaxis, progressive ambulation and ADL's and discharge planning. The patient is planning to be discharged home with home health services

## 2014-12-24 NOTE — Transfer of Care (Signed)
Immediate Anesthesia Transfer of Care Note  Patient: Roger Ayers  Procedure(s) Performed: Procedure(s): RIGHT TOTAL KNEE ARTHROPLASTY (Right)  Patient Location: PACU  Anesthesia Type:MAC, Regional and Spinal  Level of Consciousness: awake, alert  and oriented  Airway & Oxygen Therapy: Patient Spontanous Breathing  Post-op Assessment: Report given to PACU RN  Post vital signs: Reviewed and stable  Complications: No apparent anesthesia complications

## 2014-12-24 NOTE — Plan of Care (Signed)
Problem: Consults Goal: Diagnosis- Total Joint Replacement Primary Total Knee Right     

## 2014-12-24 NOTE — Anesthesia Procedure Notes (Addendum)
Spinal Patient location during procedure: OR Start time: 12/24/2014 12:40 PM End time: 12/24/2014 12:45 PM Staffing Performed by: anesthesiologist  Preanesthetic Checklist Completed: patient identified, site marked, surgical consent, pre-op evaluation, timeout performed, IV checked, risks and benefits discussed and monitors and equipment checked Spinal Block Patient position: sitting Prep: ChloraPrep Patient monitoring: heart rate, cardiac monitor, continuous pulse ox and blood pressure Approach: midline Location: L3-4 Needle Needle type: Tuohy  Needle gauge: 22 G Needle length: 9 cm Needle insertion depth: 5 cm Assessment Sensory level: T10 Additional Notes 10 mg 0.75% marcaine injected easily  Procedure Name: MAC Date/Time: 12/24/2014 12:56 PM Performed by: Kyung Rudd Pre-anesthesia Checklist: Patient identified, Emergency Drugs available, Suction available, Patient being monitored and Timeout performed Patient Re-evaluated:Patient Re-evaluated prior to inductionOxygen Delivery Method: Simple face mask Preoxygenation: Pre-oxygenation with 100% oxygen Intubation Type: IV induction Placement Confirmation: positive ETCO2    Anesthesia Regional Block:  Adductor canal block  Pre-Anesthetic Checklist: ,, timeout performed, Correct Patient, Correct Site, Correct Laterality, Correct Procedure, Correct Position, site marked, Risks and benefits discussed,  Surgical consent,  Pre-op evaluation,  At surgeon's request and post-op pain management  Laterality: Right  Prep: chloraprep       Needles:   Needle Type: Echogenic Stimulator Needle     Needle Length: 9cm 9 cm Needle Gauge: 22 and 22 G    Additional Needles:  Procedures: ultrasound guided (picture in chart) Adductor canal block Narrative:  Start time: 12/24/2014 12:30 PM End time: 12/24/2014 12:35 PM

## 2014-12-24 NOTE — Anesthesia Postprocedure Evaluation (Signed)
  Anesthesia Post-op Note  Patient: Roger Ayers  Procedure(s) Performed: Procedure(s): RIGHT TOTAL KNEE ARTHROPLASTY (Right)  Patient Location: PACU  Anesthesia Type:Spinal and MAC combined with regional for post-op pain  Level of Consciousness: awake, alert  and oriented  Airway and Oxygen Therapy: Patient Spontanous Breathing and Patient connected to nasal cannula oxygen  Post-op Pain: mild  Post-op Assessment: Post-op Vital signs reviewed, Patient's Cardiovascular Status Stable, Respiratory Function Stable, Patent Airway, No signs of Nausea or vomiting and Pain level controlled  Post-op Vital Signs: stable  Last Vitals:  Filed Vitals:   12/24/14 1750  BP: 145/85  Pulse: 76  Temp: 36.9 C  Resp: 18    Complications: No apparent anesthesia complications

## 2014-12-24 NOTE — Op Note (Signed)
NAMEZACHERIE, Roger Ayers NO.:  [id number]  MEDICAL RECORD NO.:  [id number]  LOCATION:  [id number]                        FACILITY:  [facility]  PHYSICIAN:  Lind Guest. Ninfa Linden, M.D.DATE OF BIRTH:  August 16, 1966  DATE OF PROCEDURE:  12/24/2014 DATE OF DISCHARGE:                              OPERATIVE REPORT   PREOPERATIVE DIAGNOSIS:  Severe posttraumatic arthritis, right knee.  POSTOPERATIVE DIAGNOSIS:  Severe posttraumatic arthritis, right knee.  PROCEDURE:  Right total knee arthroplasty.  IMPLANTS:  Smith and Nephew Oxinium femur size 6, size 6 tibial tray, size 13-mm constrained polyethylene insert, size 35 patellar button.  SURGEON:  Lind Guest. Ninfa Linden, M.D.  ASSISTANT:  Erskine Emery, P.A.C.  ANESTHESIA: 1. Right leg abductor block. 2. Spinal.  ANTIBIOTICS:  2 g IV Ancef.  BLOOD LOSS:  100 mL.  COMPLICATIONS:  None.  INDICATIONS:  Roger Ayers is a 48 year old gentleman who in November 2014 sustained a 10 feet fall off a ladder and suffered a debilitating bicondylar tibial plateau fracture with a crushing injury to his right knee.  He underwent external fixation and eventually open reduction and internal fixation of tibial plateau fracture.  He then developed an infection, eventually had all the hardware removed.  He has had arthroscopic surgery since then, and cleared the infection from his knee.  Over the last year, he has had debilitating pain with good range of motion but evidence of posttraumatic arthritic changes.  At this point, given the debilitating pain, the need for narcotics, he does wish to proceed with a total knee arthroplasty.  He totally understands the risk of acute blood loss anemia, nerve and vessel injury, fracture, infection, and DVT.  He understands the biggest risk for him are infection given his previous infection history.  He does wish to proceed with surgery at this point given all these risk.  The goals were decreased pain,  improved mobility, and overall improved quality of life.  PROCEDURE DESCRIPTION:  After informed consent was obtained, appropriate right knee was marked.  Anesthesia obtained abductor block in the holding room.  He was brought to the operating room, and while he was on a stretcher, spinal anesthesia was obtained.  He was then laid in the supine position.  Nonsterile tourniquet was placed on his upper right leg and his right leg was prepped and draped from the thigh down the toes with DuraPrep and sterile drapes.  Time-out was called to identify correct patient, correct right knee.  I then used an Esmarch to wrap out the leg and tourniquet was inflated to 300 mm of pressure.  We then tested him for anesthesia by pinching the knee and he did not flinch. We then made an incision in direct midline of the knee, carrying this over the patella down to the tibial tubercle and proximally.  We then performed a medial parapatellar arthrotomy and found a large joint effusion of his knee without significant arthritic changes on the lateral compartment of the knee and then torn meniscus medially and laterally.  Again, he has already had an arthroscopic intervention. Once we removed debris from the knee and with the knee in a flexed position, we used the extramedullary cutting guide to correct the varus  and valgus in negative slope and then took 9 mm off the high side on our tibia cut.  We did find sclerotic bone laterally and evidence of deformity was quite significant from the tibial plateau fracture.  We then used the intramedullary guide for cutting the femur and made our distal femoral cut using a 5-degree right cutting block and taking 9.5 mm off the distal femur.  We then went back down the extension with 11 mm to 13 mm extension block.  I felt he had good extension.  We then went back to the femur and we started femoral sizing guide based off Whiteside's line and epicondylar axis and chose a  size 6 femur.  We then placed our 4-in-1 cutting block for a size 6 femur, made our anterior and posterior cuts followed by our chamfer cuts.  We then went back to the tibia and trialed for a size 6 tibia and made our keel and drill for our post and made a cut for keel off for this.  With the trial components in place, I placed a 13-mm constrained polyethylene insert and this gave him the best stability.  We then made our patellar cut as well and drilled holes for a patellar button.  With all trial components in place, I was pleased with the stability and range of motion.  We then removed all trial components and irrigated the knee with normal saline solution using pulsatile lavage.  We then mixed our cement and cemented the real Smith and Nephew size 6 tibial tray followed by the size 6 femur.  We placed a real constrained fix bearing size 13 polyethylene insert and cemented the patellar button.  We then cleaned cement debris from the knee and when the cement had hardened, we let the tourniquet down and hemostasis was obtained with electrocautery.  We then washed the knee again with normal saline using pulsatile lavage.  I then closed the arthrotomy with interrupted #1 Vicryl suture followed by 0 Vicryl in the deep tissue, 2-0 Vicryl in subcutaneous tissue, 4-0 Monocryl subcuticular stitch, and Steri-Strips in the skin.  A well-padded sterile dressing was applied.  He was taken to the recovery room in stable condition.  All final counts were correct.  There were no complications noted.  Of note, Erskine Emery, PA-C, assisted during the entire case and his assistance was crucial for facilitating all aspects of this case.     Lind Guest. Ninfa Linden, M.D.     CYB/MEDQ  D:  12/24/2014  T:  12/24/2014  Job:  [id number]

## 2014-12-24 NOTE — Progress Notes (Signed)
Orthopedic Tech Progress Note Patient Details:  Roger Ayers 30-Jul-1966 [id number]  CPM Right Knee CPM Right Knee: On Right Knee Flexion (Degrees): 60 Right Knee Extension (Degrees): 0 Additional Comments: applied ohf to bed   Braulio Bosch 12/24/2014, 4:39 PM

## 2014-12-24 NOTE — Brief Op Note (Signed)
12/24/2014  2:58 PM  PATIENT:  Roger Ayers  48 y.o. male  PRE-OPERATIVE DIAGNOSIS:  Post traumatic arthritis right knee  POST-OPERATIVE DIAGNOSIS:  Post traumatic arthritis right knee  PROCEDURE:  Procedure(s): RIGHT TOTAL KNEE ARTHROPLASTY (Right)  SURGEON:  Surgeon(s) and Role:    * Mcarthur Rossetti, MD - Primary  PHYSICIAN ASSISTANT: Benita Stabile, PA-C  ANESTHESIA:   regional and spinal  EBL:  Total I/O In: -  Out: 100 [Blood:100]  BLOOD ADMINISTERED:none  DRAINS: none   LOCAL MEDICATIONS USED:  NONE  SPECIMEN:  No Specimen  DISPOSITION OF SPECIMEN:  N/A  COUNTS:  yes  TOURNIQUET:   Total Tourniquet Time Documented: Thigh (Right) - 85 minutes Total: Thigh (Right) - 85 minutes   DICTATION: .Other Dictation: Dictation Number [id number]  PLAN OF CARE: Admit to inpatient   PATIENT DISPOSITION:  PACU - hemodynamically stable.   Delay start of Pharmacological VTE agent (>24hrs) due to surgical blood loss or risk of bleeding: no

## 2014-12-25 ENCOUNTER — Encounter (HOSPITAL_COMMUNITY): Payer: Self-pay | Admitting: General Practice

## 2014-12-25 LAB — BASIC METABOLIC PANEL
ANION GAP: 10 (ref 5–15)
BUN: 10 mg/dL (ref 6–23)
CALCIUM: 8.2 mg/dL — AB (ref 8.4–10.5)
CO2: 23 mmol/L (ref 19–32)
Chloride: 102 mEq/L (ref 96–112)
Creatinine, Ser: 1.2 mg/dL (ref 0.50–1.35)
GFR calc Af Amer: 81 mL/min — ABNORMAL LOW (ref >90)
GFR, EST NON AFRICAN AMERICAN: 70 mL/min — AB (ref >90)
GLUCOSE: 141 mg/dL — AB (ref 70–99)
Potassium: 4.1 mmol/L (ref 3.5–5.1)
Sodium: 135 mmol/L (ref 135–145)

## 2014-12-25 LAB — CBC
HCT: 36.4 % — ABNORMAL LOW (ref 39.0–52.0)
Hemoglobin: 12 g/dL — ABNORMAL LOW (ref 13.0–17.0)
MCH: 31.2 pg (ref 26.0–34.0)
MCHC: 33 g/dL (ref 30.0–36.0)
MCV: 94.5 fL (ref 78.0–100.0)
PLATELETS: 226 10*3/uL (ref 150–400)
RBC: 3.85 MIL/uL — ABNORMAL LOW (ref 4.22–5.81)
RDW: 12.8 % (ref 11.5–15.5)
WBC: 10.9 10*3/uL — ABNORMAL HIGH (ref 4.0–10.5)

## 2014-12-25 LAB — GLUCOSE, CAPILLARY: Glucose-Capillary: 175 mg/dL — ABNORMAL HIGH (ref 70–99)

## 2014-12-25 MED ORDER — SULFAMETHOXAZOLE-TRIMETHOPRIM 800-160 MG PO TABS: 1.0000 | ORAL_TABLET | Freq: Two times a day (BID) | ORAL | Status: DC

## 2014-12-25 MED ORDER — RIVAROXABAN 10 MG PO TABS: 10.0000 mg | ORAL_TABLET | Freq: Every day | ORAL | Status: DC

## 2014-12-25 MED ORDER — OXYCODONE-ACETAMINOPHEN 5-325 MG PO TABS: 1.0000 | ORAL_TABLET | ORAL | Status: DC | PRN

## 2014-12-25 MED ORDER — OXYCODONE HCL ER 20 MG PO T12A: 20.0000 mg | EXTENDED_RELEASE_TABLET | Freq: Two times a day (BID) | ORAL | Status: DC

## 2014-12-25 NOTE — Progress Notes (Signed)
Placed patient on home CPAP unit. Will continue to monitor patient

## 2014-12-25 NOTE — Progress Notes (Signed)
Physical Therapy Treatment Patient Details Name: Roger Ayers MRN: [id number] DOB: Jun 09, 1966 Today's Date: 12/25/2014    History of Present Illness Admitted for RTKA post trauma over a year ago and multiple R?LE surgeries leading up to this one     PT Comments    Good progression of amb and transfers; Stair training done as well; taught pt and wife backwards technique, and pt described and performed a part of the way he had been managing steps over the past year (which is quite viable, and shows notable ingenuity)  Follow Up Recommendations  Home health PT;Supervision - Intermittent     Equipment Recommendations  Rolling walker with 5" wheels;3in1 (PT)    Recommendations for Other Services       Precautions / Restrictions Precautions Precautions: Knee Precaution Comments: Pt educated to not allow any pillow or bolster under knee for healing with optimal range of motion.  Restrictions RLE Weight Bearing: Weight bearing as tolerated    Mobility  Bed Mobility                  Transfers Overall transfer level: Needs assistance Equipment used: Rolling walker (2 wheeled) Transfers: Sit to/from Stand Sit to Stand: Min guard         General transfer comment: Cues for technqiue, safety, and hand placement  Ambulation/Gait Ambulation/Gait assistance: Supervision Ambulation Distance (Feet): 120 Feet Assistive device: Rolling walker (2 wheeled) Gait Pattern/deviations: Step-through pattern     General Gait Details: Initial cues for gait sequence and to activate R quad for stance stability; no reports of buckling in stance   Stairs Stairs: Yes Stairs assistance: Min assist Stair Management: No rails;Step to pattern;Backwards;With walker Number of Stairs: 2 (x2) General stair comments: Verbal cues for technique; wife present, and provided correct assist  Wheelchair Mobility    Modified Rankin (Stroke Patients Only)       Balance Overall balance  assessment: No apparent balance deficits (not formally assessed)                                  Cognition Arousal/Alertness: Awake/alert Behavior During Therapy: WFL for tasks assessed/performed Overall Cognitive Status: Within Functional Limits for tasks assessed                      Exercises      General Comments        Pertinent Vitals/Pain Pain Assessment: 0-10 Pain Score: 7  Pain Location: R knee Pain Descriptors / Indicators: Grimacing Pain Intervention(s): Monitored during session;Premedicated before session    Home Living                      Prior Function            PT Goals (current goals can now be found in the care plan section) Acute Rehab PT Goals Patient Stated Goal: less pain in R knee PT Goal Formulation: With patient Time For Goal Achievement: 01/01/15 Potential to Achieve Goals: Good Progress towards PT goals: Progressing toward goals    Frequency  7X/week    PT Plan Current plan remains appropriate    Co-evaluation             End of Session Equipment Utilized During Treatment: Gait belt;Right knee immobilizer Activity Tolerance: Patient tolerated treatment well Patient left: in chair;with call bell/phone within reach;with family/visitor present     Time: 4270-6237 PT  Time Calculation (min) (ACUTE ONLY): 18 min  Charges:  $Gait Training: 23-37 mins                    G Codes:      Quin Hoop 12/25/2014, 4:26 PM  Roney Marion, [place] Pager [phone] Office [phone]

## 2014-12-25 NOTE — Progress Notes (Signed)
Subjective: 1 Day Post-Op Procedure(s) (LRB): RIGHT TOTAL KNEE ARTHROPLASTY (Right) Patient reports pain as moderate.    Objective: Vital signs in last 24 hours: Temp:  [97.2 F (36.2 C)-98.5 F (36.9 C)] 97.8 F (36.6 C) (12/30 0631) Pulse Rate:  [61-80] 73 (12/30 0631) Resp:  [8-18] 17 (12/30 0400) BP: (125-169)/(70-111) 149/87 mmHg (12/30 0631) SpO2:  [95 %-100 %] 98 % (12/30 0631) Weight:  [115.667 kg (255 lb)] 115.667 kg (255 lb) (12/29 1046)  Intake/Output from previous day: 12/29 0701 - 12/30 0700 In: 2160 [P.O.:360; I.V.:1700; IV Piggyback:100] Out: 600 [Urine:500; Blood:100] Intake/Output this shift:     Recent Labs  12/25/14 0635  HGB 12.0*    Recent Labs  12/25/14 0635  WBC 10.9*  RBC 3.85*  HCT 36.4*  PLT 226   No results for input(s): NA, K, CL, CO2, BUN, CREATININE, GLUCOSE, CALCIUM in the last 72 hours. No results for input(s): LABPT, INR in the last 72 hours.  Sensation intact distally Intact pulses distally Dorsiflexion/Plantar flexion intact Incision: dressing C/D/I Compartment soft  Assessment/Plan: 1 Day Post-Op Procedure(s) (LRB): RIGHT TOTAL KNEE ARTHROPLASTY (Right) Up with therapy  Cordaryl Decelles Y 12/25/2014, 7:22 AM

## 2014-12-25 NOTE — Evaluation (Signed)
Physical Therapy Evaluation Patient Details Name: Roger Ayers MRN: [id number] DOB: [birth year] Today's Date: 12/25/2014   History of Present Illness  Admitted for RTKA post trauma over a year ago and multiple RLE surgeries leading up to this one  Past Medical History  Diagnosis Date  . Hypertension   . GERD (gastroesophageal reflux disease)   . Sleep apnea     cpap  . Arthritis   . MRSA infection     right leg   Past Surgical History  Procedure Laterality Date  . Mouth surgery  2014    bone grafts for implants  . Trigger finger release Right ~2013    middle finger  . External fixation leg Right 11/17/2013    Procedure: EXTERNAL FIXATION LEG;  Surgeon: Marianna Payment, MD;  Location: [facility];  Service: Orthopedics;  Laterality: Right;  . Orif tibia plateau Right 11/23/2013    Procedure: OPEN REDUCTION INTERNAL FIXATION (ORIF) RIGHT TIBIAL PLATEAU;  Surgeon: Mcarthur Rossetti, MD;  Location: [facility];  Service: Orthopedics;  Laterality: Right;  . External fixation removal Right 11/23/2013    Procedure: REMOVAL EXTERNAL FIXATION RIGHT LEG;  Surgeon: Mcarthur Rossetti, MD;  Location: [facility];  Service: Orthopedics;  Laterality: Right;  . I&d extremity Right 12/05/2013    Procedure: IRRIGATION AND DEBRIDEMENT RIGHT LEG;  Surgeon: Mcarthur Rossetti, MD;  Location: [place];  Service: Orthopedics;  Laterality: Right;  . I&d extremity Right 12/08/2013    Procedure: Repeat IRRIGATION AND DEBRIDEMENT Right Leg with Stimulan bead placement;  Surgeon: Mcarthur Rossetti, MD;  Location: [place];  Service: Orthopedics;  Laterality: Right;  . Knee arthroscopy Right 03/08/2014    Procedure:  arthroscopy RIGHT KNEE WITH EXTENSIVE DEBRIDEMENT;  Surgeon: Mcarthur Rossetti, MD;  Location: [facility];  Service: Orthopedics;  Laterality: Right;  . Hardware removal Right 03/08/2014    Procedure: Removal plate and screws right tibia,;  Surgeon: Mcarthur Rossetti, MD;  Location: [facility];  Service: Orthopedics;  Laterality: Right;  . Total knee arthroplasty Right 12/24/2014    dr Ninfa Linden  . Total knee arthroplasty Right 12/24/2014    Procedure: RIGHT TOTAL KNEE ARTHROPLASTY;  Surgeon: Mcarthur Rossetti, MD;  Location: [facility];  Service: Orthopedics;  Laterality: Right;     Clinical Impression  Pt is s/p TKA resulting in the deficits listed below (see PT Problem List).  Pt will benefit from skilled PT to increase their independence and safety with mobility to allow discharge to the venue listed below.      Follow Up Recommendations Home health PT;Supervision - Intermittent    Equipment Recommendations  Rolling walker with 5" wheels;3in1 (PT)    Recommendations for Other Services       Precautions / Restrictions Precautions Precautions: Knee Precaution Comments: Pt educated to not allow any pillow or bolster under knee for healing with optimal range of motion.  Restrictions RLE Weight Bearing: Weight bearing as tolerated      Mobility  Bed Mobility Overal bed mobility: Needs Assistance Bed Mobility: Supine to Sit     Supine to sit: Supervision     General bed mobility comments: Cues for technique  Transfers Overall transfer level: Needs assistance Equipment used: Rolling walker (2 wheeled) Transfers: Sit to/from Stand Sit to Stand: Min assist         General transfer comment: Cues for technqiue, safety, and hand placement  Ambulation/Gait Ambulation/Gait assistance: Min guard;Supervision Ambulation Distance (Feet): 120 Feet Assistive device: Rolling walker (2 wheeled)  Gait Pattern/deviations: Step-through pattern (emerging)     General Gait Details: Initial cues for gait sequence and to activate R quad for stance stability; no reports of buckling in stance  Stairs            Wheelchair Mobility    Modified Rankin (Stroke Patients Only)       Balance Overall balance assessment: No apparent balance deficits (not formally  assessed)                                           Pertinent Vitals/Pain Pain Assessment: 0-10 Pain Score: 5  (end of session; "11/10" with pushing knee flexion) Pain Location: R knee Pain Descriptors / Indicators: Aching;Grimacing Pain Intervention(s): Monitored during session;Premedicated before session    Home Living Family/patient expects to be discharged to:: Private residence Living Arrangements: Spouse/significant other Available Help at Discharge: Family;Available 24 hours/day Type of Home: House Home Access: Stairs to enter Entrance Stairs-Rails: None Entrance Stairs-Number of Steps: 3 Home Layout: One level Home Equipment: Walker - 2 wheels;Bedside commode;Tub bench Additional Comments: Is apparently borrowing his equipment; he states this has worked ok with previous surgeries    Prior Function Level of Independence: Independent with assistive device(s)               Hand Dominance   Dominant Hand: Right    Extremity/Trunk Assessment   Upper Extremity Assessment: Overall WFL for tasks assessed           Lower Extremity Assessment: RLE deficits/detail RLE Deficits / Details: Grossly decr "AROM and strength, limited by pain       Communication   Communication: No difficulties  Cognition Arousal/Alertness: Awake/alert Behavior During Therapy: WFL for tasks assessed/performed Overall Cognitive Status: Within Functional Limits for tasks assessed                      General Comments      Exercises Total Joint Exercises Ankle Circles/Pumps: AROM;Both;10 reps Quad Sets: AROM;Both;20 reps Short Arc QuadSinclair Ship;Right;10 reps Heel Slides: AAROM;Right;10 reps Straight Leg Raises: AAROM;Right;10 reps Goniometric ROM: approx 5-70 degrees      Assessment/Plan    PT Assessment Patient needs continued PT services  PT Diagnosis Difficulty walking;Acute pain   PT Problem List Decreased strength;Decreased range of  motion;Decreased activity tolerance;Decreased mobility;Decreased knowledge of use of DME;Decreased knowledge of precautions;Pain  PT Treatment Interventions DME instruction;Gait training;Stair training;Functional mobility training;Therapeutic activities;Therapeutic exercise;Patient/family education   PT Goals (Current goals can be found in the Care Plan section) Acute Rehab PT Goals Patient Stated Goal: less pain in R knee PT Goal Formulation: With patient Time For Goal Achievement: 01/01/15 Potential to Achieve Goals: Good    Frequency 7X/week   Barriers to discharge        Co-evaluation               End of Session Equipment Utilized During Treatment: Gait belt;Right knee immobilizer Activity Tolerance: Patient tolerated treatment well Patient left: in chair;with call bell/phone within reach Nurse Communication: Mobility status         Time: 0831-0910 PT Time Calculation (min) (ACUTE ONLY): 39 min   Charges:   PT Evaluation $Initial PT Evaluation Tier I: 1 Procedure PT Treatments $Gait Training: 8-22 mins $Therapeutic Exercise: 8-22 mins   PT G Codes:        Roney Marion Hamff 12/25/2014, 10:51  AM  Roney Marion, PT  Acute Rehabilitation Services Pager [phone] Office [phone]

## 2014-12-25 NOTE — Evaluation (Signed)
Occupational Therapy Evaluation Patient Details Name: Roger Ayers MRN: [id number] DOB: December 04, 1966 Today's Date: 12/25/2014    History of Present Illness s/p R TKA after injuring LE falling off a roof over a year ago.  Pt has had multiple surgeries and complications from MRSA.   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  Pt presents with R knee pain with decreased ROM and impaired balance as would be expected on POD 1.  Educated pt in use of AE for LB ADL, safe footwear, and fall prevention.  Pt has access to all necessary DME and is knowledgeable in use from previous knee surgeries.  Declined practicing tub transfer.  Pt has support of his wife upon discharge.  No further OT needs.    Follow Up Recommendations  No OT follow up    Equipment Recommendations  None recommended by OT    Recommendations for Other Services       Precautions / Restrictions Precautions Precautions: Knee;Fall Precaution Comments:  Restrictions Weight Bearing Restrictions: Yes RLE Weight Bearing: Weight bearing as tolerated Other Position/Activity Restrictions: KI in room, but no orders, did not use      Mobility Bed Mobility Overal bed mobility: Needs Assistance Bed Mobility: Supine to Sit     Supine to sit: Supervision     General bed mobility comments: pt received in chair and returned  Transfers Overall transfer level: Needs assistance Equipment used: Rolling walker (2 wheeled) Transfers: Sit to/from Stand Sit to Stand: Min assist         General transfer comment: Cues for technqiue, safety, and hand placement    Balance Overall balance assessment: No apparent balance deficits (not formally assessed)                                          ADL Overall ADL's : Needs assistance/impaired Eating/Feeding: Independent;Sitting   Grooming: Wash/dry hands;Min guard;Standing   Upper  Body Bathing: Set up;Sitting   Lower Body Bathing: Minimal assistance;Sit to/from stand   Upper Body Dressing : Set up;Sitting   Lower Body Dressing: Minimal assistance;Sit to/from stand   Toilet Transfer: Min guard;Ambulation (stood to urinate)   Toileting- Water quality scientist and Hygiene: Minimal assistance;Sit to/from Nurse, children's Details (indicate cue type and reason): pt is able to use tub seat with two legs on the outside and avoid stepping over the edge   General ADL Comments: Pt was using a reacher with previous surgeries for LB dressing.  Relying on wife for socks and shoes.  Educated pt in use of sock aide, long shoe horn and long bath sponge.       Vision                     Perception     Praxis      Pertinent Vitals/Pain Pain Assessment: 0-10 Pain Score: 7  Pain Location: R knee Pain Descriptors / Indicators: Aching Pain Intervention(s): Monitored during session;Limited activity within patient's tolerance;Premedicated before session;Repositioned     Hand Dominance Right   Extremity/Trunk Assessment Upper Extremity Assessment Upper Extremity Assessment: Overall WFL for tasks assessed   Lower Extremity Assessment Lower Extremity Assessment: Defer to PT evaluation RLE Deficits / Details: Grossly decr "AROM and strength, limited by pain  Communication Communication Communication: No difficulties   Cognition Arousal/Alertness: Awake/alert Behavior During Therapy: WFL for tasks assessed/performed Overall Cognitive Status: Within Functional Limits for tasks assessed                     General Comments       Exercises Exercises: Total Joint     Shoulder Instructions      Home Living Family/patient expects to be discharged to:: Private residence Living Arrangements: Spouse/significant other Available Help at Discharge: Family;Available 24 hours/day Type of Home: House Home Access: Stairs to enter [employer] of Steps: 3 Entrance Stairs-Rails: None Home Layout: One level     Bathroom Shower/Tub: Teacher, early years/pre: Standard (with sink beside)     Home Equipment: Walker - 2 wheels;Bedside commode;Shower seat;Adaptive equipment Adaptive Equipment: Reacher Additional Comments: Is apparently borrowing his equipment; he states this has worked ok with previous surgeries      Prior Functioning/Environment Level of Independence: Independent with assistive device(s)             OT Diagnosis:     OT Problem List:     OT Treatment/Interventions:      OT Goals(Current goals can be found in the care plan section) Acute Rehab OT Goals Patient Stated Goal: less pain in R knee  OT Frequency:     Barriers to D/C:            Co-evaluation              End of Session Equipment Utilized During Treatment: Gait belt;Rolling walker  Activity Tolerance: Patient tolerated treatment well Patient left: in chair;with call bell/phone within reach   Time: 1059-1118 OT Time Calculation (min): 19 min Charges:  OT General Charges $OT Visit: 1 Procedure OT Evaluation $Initial OT Evaluation Tier I: 1 Procedure OT Treatments $Self Care/Home Management : 8-22 mins G-Codes:    Malka So 12/25/2014, 11:32 AM  [phone]

## 2014-12-25 NOTE — Discharge Instructions (Signed)
Information on my medicine - XARELTO® (Rivaroxaban) ° °This medication education was reviewed with me or my healthcare representative as part of my discharge preparation.  The pharmacist that spoke with me during my hospital stay was:  Vickye Astorino Stillinger, RPH ° °Why was Xarelto® prescribed for you? °Xarelto® was prescribed for you to reduce the risk of blood clots forming after orthopedic surgery. The medical term for these abnormal blood clots is venous thromboembolism (VTE). ° °What do you need to know about xarelto® ? °Take your Xarelto® ONCE DAILY at the same time every day. °You may take it either with or without food. ° °If you have difficulty swallowing the tablet whole, you may crush it and mix in applesauce just prior to taking your dose. ° °Take Xarelto® exactly as prescribed by your doctor and DO NOT stop taking Xarelto® without talking to the doctor who prescribed the medication.  Stopping without other VTE prevention medication to take the place of Xarelto® may increase your risk of developing a clot. ° °After discharge, you should have regular check-up appointments with your healthcare provider that is prescribing your Xarelto®.   ° °What do you do if you miss a dose? °If you miss a dose, take it as soon as you remember on the same day then continue your regularly scheduled once daily regimen the next day. Do not take two doses of Xarelto® on the same day.  ° °Important Safety Information °A possible side effect of Xarelto® is bleeding. You should call your healthcare provider right away if you experience any of the following: °? Bleeding from an injury or your nose that does not stop. °? Unusual colored urine (red or dark brown) or unusual colored stools (red or black). °? Unusual bruising for unknown reasons. °? A serious fall or if you hit your head (even if there is no bleeding). ° °Some medicines may interact with Xarelto® and might increase your risk of bleeding while on Xarelto®. To  help avoid this, consult your healthcare provider or pharmacist prior to using any new prescription or non-prescription medications, including herbals, vitamins, non-steroidal anti-inflammatory drugs (NSAIDs) and supplements. ° °This website has more information on Xarelto®: www.xarelto.com. ° ° ° °

## 2014-12-25 NOTE — Progress Notes (Signed)
CARE MANAGEMENT NOTE 12/25/2014  Patient:  Roger Ayers, Roger Ayers   Account Number:  [id number]  Date Initiated:  12/25/2014  Documentation initiated by:  [facility]  Subjective/Objective Assessment:   s/p rt TKA     Action/Plan:   PT/OT evals- recommended HHPT   Anticipated DC Date:  12/26/2014   Anticipated DC Plan:  [place]  CM consult      [facility] Choice  HOME HEALTH   Choice offered to / List presented to:  C-1 Patient        [place] arranged  [facility]      [place].   Status of service:  Completed, signed off Medicare Important Message given?   (If response is "NO", the following Medicare IM given date fields will be blank) Date Medicare IM given:   Medicare IM given by:   Date Additional Medicare IM given:   Additional Medicare IM given by:    Discharge Disposition:  [place]  Per UR Regulation:  Reviewed for med. necessity/level of care/duration of stay  If discussed at [place] of Stay Meetings, dates discussed:    Comments:  12/25/14 Spoke with patient and his wife about HHC. They selected [facility]. Patient states that he has a rolling walker and does not need a 3N1. Contacted Miranda at [facility] and set up [place] per patient's request. No other d/c needs identified.

## 2014-12-26 LAB — CBC
HCT: 33.8 % — ABNORMAL LOW (ref 39.0–52.0)
HEMOGLOBIN: 10.9 g/dL — AB (ref 13.0–17.0)
MCH: 30.2 pg (ref 26.0–34.0)
MCHC: 32.2 g/dL (ref 30.0–36.0)
MCV: 93.6 fL (ref 78.0–100.0)
Platelets: 218 10*3/uL (ref 150–400)
RBC: 3.61 MIL/uL — AB (ref 4.22–5.81)
RDW: 12.6 % (ref 11.5–15.5)
WBC: 17.3 10*3/uL — ABNORMAL HIGH (ref 4.0–10.5)

## 2014-12-26 NOTE — Discharge Summary (Signed)
Patient ID: Roger Ayers MRN: [id number] DOB/AGE: 06/29/1966 48 y.o.  Admit date: 12/24/2014 Discharge date: 12/26/2014  Admission Diagnoses:  Principal Problem:   Traumatic arthritis of right knee Active Problems:   Status post total right knee replacement   Discharge Diagnoses:  Same  Past Medical History  Diagnosis Date  . Hypertension   . GERD (gastroesophageal reflux disease)   . Sleep apnea     cpap  . Arthritis   . MRSA infection     right leg    Surgeries: Procedure(s): RIGHT TOTAL KNEE ARTHROPLASTY on 12/24/2014   Consultants:    Discharged Condition: Improved  Hospital Course: Roger Ayers is an 48 y.o. male who was admitted 12/24/2014 for operative treatment ofTraumatic arthritis of right knee. Patient has severe unremitting pain that affects sleep, daily activities, and work/hobbies. After pre-op clearance the patient was taken to the operating room on 12/24/2014 and underwent  Procedure(s): RIGHT TOTAL KNEE ARTHROPLASTY.    Patient was given perioperative antibiotics: Anti-infectives    Start     Dose/Rate Route Frequency Ordered Stop   12/25/14 0000  sulfamethoxazole-trimethoprim (BACTRIM DS,SEPTRA DS) 800-160 MG per tablet     1 tablet Oral 2 times daily 12/25/14 1813     12/24/14 1900  clindamycin (CLEOCIN) IVPB 600 mg     600 mg100 mL/hr over 30 Minutes Intravenous Every 6 hours 12/24/14 1748 12/25/14 0217   12/24/14 0600  ceFAZolin (ANCEF) IVPB 2 g/50 mL premix     2 g100 mL/hr over 30 Minutes Intravenous On call to O.R. 12/23/14 1349 12/24/14 1255       Patient was given sequential compression devices, early ambulation, and chemoprophylaxis to prevent DVT.  Patient benefited maximally from hospital stay and there were no complications.    Recent vital signs: Patient Vitals for the past 24 hrs:  BP Temp Temp src Pulse Resp SpO2  12/26/14 0616 - - - (!) 117 - 95 %  12/26/14 0528 - - - - - 90 %  12/26/14 0500 110/70 mmHg 98.9 F (37.2  C) - (!) 127 18 (!) 85 %  12/26/14 0446 - - - (!) 108 - -  12/26/14 0024 - - - (!) 112 18 94 %  12/26/14 0000 - - - - 18 90 %  12/25/14 2200 121/81 mmHg 99.8 F (37.7 C) - (!) 107 18 94 %  12/25/14 2000 - - - - 18 95 %  12/25/14 1600 - - - - 18 -  12/25/14 1300 138/69 mmHg 98.7 F (37.1 C) - 81 18 96 %  12/25/14 1200 - - - - 18 -  12/25/14 0800 - - - - 18 -  12/25/14 0631 (!) 149/87 mmHg 97.8 F (36.6 C) Oral 73 - 98 %     Recent laboratory studies:  Recent Labs  12/25/14 0635 12/26/14 0506  WBC 10.9* 17.3*  HGB 12.0* 10.9*  HCT 36.4* 33.8*  PLT 226 218  NA 135  --   K 4.1  --   CL 102  --   CO2 23  --   BUN 10  --   CREATININE 1.20  --   GLUCOSE 141*  --   CALCIUM 8.2*  --      Discharge Medications:     Medication List    STOP taking these medications        aspirin EC 325 MG tablet     HYDROcodone-acetaminophen 5-325 MG per tablet  Commonly known as:  NORCO/VICODIN  TAKE these medications        lisinopril 5 MG tablet  Commonly known as:  PRINIVIL,ZESTRIL  Take 5 mg by mouth every morning.     multivitamin with minerals Tabs tablet  Take 1 tablet by mouth daily.     omeprazole 20 MG capsule  Commonly known as:  PRILOSEC  Take 20 mg by mouth every morning.     OxyCODONE 20 mg T12a 12 hr tablet  Commonly known as:  OXYCONTIN  Take 1 tablet (20 mg total) by mouth every 12 (twelve) hours.     oxyCODONE-acetaminophen 5-325 MG per tablet  Commonly known as:  PERCOCET/ROXICET  Take 1-2 tablets by mouth every 4 (four) hours as needed for severe pain.     rivaroxaban 10 MG Tabs tablet  Commonly known as:  XARELTO  Take 1 tablet (10 mg total) by mouth daily with breakfast.     rOPINIRole 0.25 MG tablet  Commonly known as:  REQUIP  Take 0.5 mg by mouth at bedtime.     sulfamethoxazole-trimethoprim 800-160 MG per tablet  Commonly known as:  BACTRIM DS,SEPTRA DS  Take 1 tablet by mouth 2 (two) times daily.        Diagnostic Studies: Dg  Knee Right Port  12/24/2014   CLINICAL DATA:  Status post total knee replacement  EXAM: PORTABLE RIGHT KNEE - 1-2 VIEW  COMPARISON:  March 08, 2014  FINDINGS: Frontal and lateral views were obtained. The patient is status post total knee replacement with femoral and tibial prosthetic components appearing well-seated. No acute fracture or dislocation. Air within the joint is an expected postoperative finding.  IMPRESSION: Prosthetic components appear well seated. No acute fracture or dislocation.   Electronically Signed   By: Lowella Grip M.D.   On: 12/24/2014 16:19    Disposition: 01-Home or Self Care      Discharge Instructions    Call MD / Call 911    Complete by:  As directed   If you experience chest pain or shortness of breath, CALL 911 and be transported to the hospital emergency room.  If you develope a fever above 101 F, pus (white drainage) or increased drainage or redness at the wound, or calf pain, call your surgeon's office.     Constipation Prevention    Complete by:  As directed   Drink plenty of fluids.  Prune juice may be helpful.  You may use a stool softener, such as Colace (over the counter) 100 mg twice a day.  Use MiraLax (over the counter) for constipation as needed.     Diet - low sodium heart healthy    Complete by:  As directed      Discharge instructions    Complete by:  As directed   Increase your activities as comfort allows. Full weight bearing and work on knee motion. You can get your current knee dressing wet daily in the shower. Leave your current dressing on if possible until your outpatient follow-up. Do take an over the counter stool softener.     Discharge patient    Complete by:  As directed      Increase activity slowly as tolerated    Complete by:  As directed            Follow-up Information    Follow up with [facility].   Why:  They will contact you to schedule home physical therapy visits.   Contact information:    [address]  [place] [address] [phone]       Follow up with Mcarthur Rossetti, MD In 2 weeks.   Specialty:  Orthopedic Surgery   Contact information:   [place] [address] [phone]        Signed: Mcarthur Rossetti 12/26/2014, 6:22 AM

## 2014-12-26 NOTE — Progress Notes (Signed)
Physical Therapy Treatment Patient Details Name: Roger Ayers MRN: [id number] DOB: [birth year] Today's Date: 12/26/2014    History of Present Illness Admitted for RTKA post trauma over a year ago and multiple R?LE surgeries leading up to this one    PT Comments    Pt mobilizing at supervision level with min cues for terminal knee extension. Reviewed HEP and stair management technique. Pt ready from PT standpoint to D/C home at this time.   Follow Up Recommendations  Home health PT;Supervision - Intermittent     Equipment Recommendations  Rolling walker with 5" wheels;3in1 (PT) (reports neighbor is giving him DME)    Recommendations for Other Services       Precautions / Restrictions Precautions Precautions: Knee Precaution Comments: reinforced no pillow under knee  Required Braces or Orthoses: Other Brace/Splint Other Brace/Splint: ambulated without KI; no buckling noted  Restrictions Weight Bearing Restrictions: Yes RLE Weight Bearing: Weight bearing as tolerated    Mobility  Bed Mobility Overal bed mobility: Modified Independent Bed Mobility: Supine to Sit     Supine to sit: Modified independent (Device/Increase time)     General bed mobility comments: educated on leg lift technique   Transfers Overall transfer level: Needs assistance Equipment used: Rolling walker (2 wheeled) Transfers: Sit to/from Stand Sit to Stand: Supervision         General transfer comment: min cues for technique   Ambulation/Gait Ambulation/Gait assistance: Supervision Ambulation Distance (Feet): 200 Feet Assistive device: Rolling walker (2 wheeled) Gait Pattern/deviations: Decreased step length - left;Decreased stance time - right;Antalgic;Wide base of support;Trunk flexed Gait velocity: decreased Gait velocity interpretation: Below normal speed for age/gender General Gait Details: cues for Rt LE extension in stance phase; multimodal cues for upright posture; practiced  terminal ext in standing for gt sequencing ; multiple standing rest breaks    Stairs         General stair comments: verbally reviewed technique   Wheelchair Mobility    Modified Rankin (Stroke Patients Only)       Balance Overall balance assessment: No apparent balance deficits (not formally assessed)                                  Cognition Arousal/Alertness: Awake/alert Behavior During Therapy: WFL for tasks assessed/performed Overall Cognitive Status: Within Functional Limits for tasks assessed                      Exercises Other Exercises Other Exercises: reviewed handout and answered all questions regarding HEP     General Comments General comments (skin integrity, edema, etc.): given HEP; all questions answered       Pertinent Vitals/Pain Pain Assessment: 0-10 Pain Score: 5  Pain Location: Rt knee  Pain Descriptors / Indicators: Aching;Sore Pain Intervention(s): Monitored during session;Premedicated before session;Repositioned    Home Living                      Prior Function            PT Goals (current goals can now be found in the care plan section) Acute Rehab PT Goals Patient Stated Goal: home today PT Goal Formulation: With patient Time For Goal Achievement: 01/01/15 Potential to Achieve Goals: Good Progress towards PT goals: Progressing toward goals    Frequency  7X/week    PT Plan Current plan remains appropriate    Co-evaluation  End of Session   Activity Tolerance: Patient tolerated treatment well Patient left: in chair;with call bell/phone within reach;with family/visitor present     Time: 7543-6067 PT Time Calculation (min) (ACUTE ONLY): 25 min  Charges:  [employer] Training: 23-37 mins                    G CodesGustavus Bryant, [place] 12/26/2014, 10:29 AM

## 2014-12-26 NOTE — Progress Notes (Signed)
Patient ID: Roger Ayers, male   DOB: June 26, 1966, 48 y.o.   MRN: [id number] Good progress with therapy.  Doing well overall.  Vitals stable.  Calf soft. Dressing changed.  Incision looks good.  Can discharge to home today.

## 2015-01-15 ENCOUNTER — Ambulatory Visit: Payer: BLUE CROSS/BLUE SHIELD | Attending: Orthopaedic Surgery | Admitting: Physical Therapy

## 2015-01-15 DIAGNOSIS — Z96651 Presence of right artificial knee joint: Secondary | ICD-10-CM | POA: Insufficient documentation

## 2015-01-15 DIAGNOSIS — Z471 Aftercare following joint replacement surgery: Secondary | ICD-10-CM | POA: Insufficient documentation

## 2015-01-15 DIAGNOSIS — I10 Essential (primary) hypertension: Secondary | ICD-10-CM | POA: Diagnosis not present

## 2015-01-22 ENCOUNTER — Ambulatory Visit: Payer: BLUE CROSS/BLUE SHIELD

## 2015-01-22 DIAGNOSIS — Z471 Aftercare following joint replacement surgery: Secondary | ICD-10-CM | POA: Diagnosis not present

## 2015-01-24 ENCOUNTER — Ambulatory Visit: Payer: BLUE CROSS/BLUE SHIELD | Admitting: Physical Therapy

## 2015-01-24 DIAGNOSIS — Z471 Aftercare following joint replacement surgery: Secondary | ICD-10-CM | POA: Diagnosis not present

## 2015-01-27 ENCOUNTER — Ambulatory Visit: Payer: BLUE CROSS/BLUE SHIELD | Attending: Orthopaedic Surgery | Admitting: Physical Therapy

## 2015-01-27 DIAGNOSIS — Z96651 Presence of right artificial knee joint: Secondary | ICD-10-CM | POA: Diagnosis not present

## 2015-01-27 DIAGNOSIS — M6281 Muscle weakness (generalized): Secondary | ICD-10-CM | POA: Diagnosis not present

## 2015-01-27 DIAGNOSIS — R609 Edema, unspecified: Secondary | ICD-10-CM | POA: Insufficient documentation

## 2015-01-29 ENCOUNTER — Ambulatory Visit: Payer: BLUE CROSS/BLUE SHIELD | Admitting: Physical Therapy

## 2015-01-29 DIAGNOSIS — M6281 Muscle weakness (generalized): Secondary | ICD-10-CM | POA: Diagnosis not present

## 2015-02-03 ENCOUNTER — Ambulatory Visit: Payer: BLUE CROSS/BLUE SHIELD | Admitting: Physical Therapy

## 2015-02-03 DIAGNOSIS — M6281 Muscle weakness (generalized): Secondary | ICD-10-CM | POA: Diagnosis not present

## 2015-02-05 ENCOUNTER — Ambulatory Visit: Payer: BLUE CROSS/BLUE SHIELD | Admitting: Physical Therapy

## 2015-02-05 DIAGNOSIS — M6281 Muscle weakness (generalized): Secondary | ICD-10-CM | POA: Diagnosis not present

## 2015-02-10 ENCOUNTER — Ambulatory Visit: Payer: BLUE CROSS/BLUE SHIELD | Admitting: Physical Therapy

## 2015-02-12 ENCOUNTER — Ambulatory Visit: Payer: BLUE CROSS/BLUE SHIELD | Admitting: Physical Therapy

## 2015-02-12 ENCOUNTER — Encounter: Payer: Self-pay | Admitting: Physical Therapy

## 2015-02-12 DIAGNOSIS — Z96651 Presence of right artificial knee joint: Secondary | ICD-10-CM

## 2015-02-12 DIAGNOSIS — M6281 Muscle weakness (generalized): Secondary | ICD-10-CM | POA: Diagnosis not present

## 2015-02-12 NOTE — Therapy (Signed)
[facility] [address] [place], [place], [place], [address] Phone: [phone]   Fax:  [phone]  Physical Therapy Treatment  Patient Details  Name: Roger Ayers MRN: [id number] Date of Birth: [birth year] Referring Provider:  Marton Redwood, MD  Encounter Date: 02/12/2015      PT End of Session - 02/12/15 1134    Visit Number 9   Number of Visits 16   Date for PT Re-Evaluation 03/12/15   PT Start Time 3220   PT Stop Time 1130   PT Time Calculation (min) 75 min   Activity Tolerance Patient tolerated treatment well   Behavior During Therapy [facility] for tasks assessed/performed      Past Medical History  Diagnosis Date  . Hypertension   . GERD (gastroesophageal reflux disease)   . Sleep apnea     cpap  . Arthritis   . MRSA infection     right leg    Past Surgical History  Procedure Laterality Date  . Mouth surgery  2014    bone grafts for implants  . Trigger finger release Right ~2013    middle finger  . External fixation leg Right 11/17/2013    Procedure: EXTERNAL FIXATION LEG;  Surgeon: Marianna Payment, MD;  Location: [facility];  Service: Orthopedics;  Laterality: Right;  . Orif tibia plateau Right 11/23/2013    Procedure: OPEN REDUCTION INTERNAL FIXATION (ORIF) RIGHT TIBIAL PLATEAU;  Surgeon: Mcarthur Rossetti, MD;  Location: [facility];  Service: Orthopedics;  Laterality: Right;  . External fixation removal Right 11/23/2013    Procedure: REMOVAL EXTERNAL FIXATION RIGHT LEG;  Surgeon: Mcarthur Rossetti, MD;  Location: [facility];  Service: Orthopedics;  Laterality: Right;  . I&d extremity Right 12/05/2013    Procedure: IRRIGATION AND DEBRIDEMENT RIGHT LEG;  Surgeon: Mcarthur Rossetti, MD;  Location: [place];  Service: Orthopedics;  Laterality: Right;  . I&d extremity Right 12/08/2013    Procedure: Repeat IRRIGATION AND DEBRIDEMENT Right Leg with Stimulan bead placement;  Surgeon: Mcarthur Rossetti, MD;   Location: [place];  Service: Orthopedics;  Laterality: Right;  . Knee arthroscopy Right 03/08/2014    Procedure:  arthroscopy RIGHT KNEE WITH EXTENSIVE DEBRIDEMENT;  Surgeon: Mcarthur Rossetti, MD;  [facility];  Service: Orthopedics;  Laterality: Right;  . Hardware removal Right 03/08/2014    Procedure: Removal plate and screws right tibia,;  Surgeon: Mcarthur Rossetti, MD;  Location: [facility];  Service: Orthopedics;  Laterality: Right;  . Total knee arthroplasty Right 12/24/2014    dr Ninfa Linden  . Total knee arthroplasty Right 12/24/2014    Procedure: RIGHT TOTAL KNEE ARTHROPLASTY;  Surgeon: Mcarthur Rossetti, MD;  Location: [place];  Service: Orthopedics;  Laterality: Right;    There were no vitals taken for this visit.  Visit Diagnosis:  Status post total right knee replacement      Subjective Assessment - 02/12/15 1021    Symptoms Patient reports he has pain on lateral right knee. Patient reports he has limited right knee range of motion. Patient reports he has to move slowly.  Patient has difficulty with bending forward.  Right knee buckles when moving to the side.   Limitations Walking;House hold activities   How long can you sit comfortably? Patient has pain when going from sitting to standing.    How long can you stand comfortably? No difficulty   Diagnostic tests None   Patient Stated Goals Return to work.    Currently in Pain? Yes  Pain Score 5    Pain Location Knee   Pain Orientation Right   Pain Descriptors / Indicators Aching;Sharp;Shooting   Pain Type Surgical pain   Pain Onset More than a month ago   Pain Frequency Constant   Aggravating Factors  twist right leg wrong or too much activity   Pain Relieving Factors ice, heat   Effect of Pain on Daily Activities makes daily activities difficult   Multiple Pain Sites No          [facility] PT Assessment - 02/12/15 0001    AROM   Right Knee Extension -4   Right Knee Flexion 104                   [facility] Adult PT Treatment/Exercise - 02/12/15 0001    Ambulation/Gait   Ambulation/Gait Yes   Ambulation/Gait Assistance 7: Independent  walk between 12 inch line to prevent his leg from abducting.   Knee/Hip Exercises: Aerobic   Stationary Bike level 2 x 10 minutes  moved seat up 2 times   Knee/Hip Exercises: Standing   Lateral Step Up Right;10 reps  4 inch step   Forward Step Up 10 reps;Right  lean on elliptical; 6 inch step   Step Down Right;15 reps  2 inch step, hold on, therapist guide knee   Functional Squat 10 reps  stand on rt. leg with hand on wobble pole reaching for cones   Other Standing Knee Exercises stand on flat side of BOSU ball; keep balance eyes open and eyes closed; mini squat    Knee/Hip Exercises: Seated   Long Arc Quad Right;10 reps;3 sets  5 pounds hold 5 seconds; up fast down slow   Knee/Hip Exercises: Supine   Straight Leg Raises Right;15 reps  15x1 2 pounds, 15x2 3 pounds   Modalities   Modalities --  vasopnuematic x 15 minutes right knee elevated   Knee/Hip Exercises: Machines for Strengthening   Cybex Leg Press 2 legs 75 pounds 2/15; right leg 35 pounds 15x2  verbal cues to fully straigthen right knee                  PT Short Term Goals - 02/12/15 1054    PT SHORT TERM GOAL #1   Title be independent with initial HEP for contracting quadricep correctly   Time 4   Period Weeks   Status Achieved   PT SHORT TERM GOAL #2   Title Right knee extension AROM >/-4 degrees   Time 4   Period Weeks   Status Achieved   PT SHORT TERM GOAL #3   Title Right knee flexion PROM  >/= 100 degrees   Time 4   Period Weeks   Status Achieved           PT Long Term Goals - 02/12/15 1117    PT LONG TERM GOAL #1   Title demonstrate and /or verbalize techniques to reduce the risk of re-injury to include info on anti-inflammatory   Time 8   Period Weeks   Status Achieved   PT LONG TERM GOAL #2   Title be independent with advanced HEP for knee  strengthening   Time 8   Period Weeks   Status New   PT LONG TERM GOAL #3   Title go up and down stairs with step over step pattern   Time 8   Period Weeks   Status New   PT LONG TERM GOAL #4   Title flex right  knee >/= 115 degrees so he can get in and out of car easier  reports it is 50% easier   Time 8   Status Partially Met   PT LONG TERM GOAL #5   Title ambulate to work from the parking lot with minimal gait deficitis   Time 8   Period Weeks   Status New   Additional Long Term Goals   Additional Long Term Goals Yes   PT LONG TERM GOAL #6   Title right knee strength >/= 4+/5 so patient can return to daily activities   Time [place] - 02/12/15 1135    Clinical Impression Statement Patient is weak with one legged stance activities.  Decreased right knee range of motion and strength. Difficulty with stairs. Verbal cues to not abduct right knee with gait.    Pt will benefit from skilled therapeutic intervention in order to improve on the following deficits Abnormal gait;Decreased range of motion;Difficulty walking;Impaired flexibility;Increased edema;Decreased mobility;Decreased strength   Rehab Potential Good   Clinical Impairments Affecting Rehab Potential None   PT Frequency 2x / week   PT Duration 8 weeks   PT Treatment/Interventions Therapeutic activities;Patient/family education;Therapeutic exercise;Passive range of motion;Manual techniques;Balance training;Gait training  vaspnuematic device   PT Next Visit Plan work on one legged stance activities   PT Home Exercise Plan review HEP   Consulted and Agree with Plan of Care Patient        Problem List Patient Active Problem List   Diagnosis Date Noted  . Traumatic arthritis of right knee 12/24/2014  . Status post total right knee replacement 12/24/2014  . Retained orthopedic hardware right tibia; previous tibial plateau fracture 03/08/2014  . S/P right knee  arthroscopy 03/08/2014  . Infection of right tibia post ORIF tibial plateau 12/05/2013  . Tibial plateau fracture 11/17/2013    GRAY,CHERYL, PT 02/12/2015, 11:39 AM  [facility] [address] [place], [place], [place], [address] Phone: [phone]   Fax:  [phone]

## 2015-02-14 ENCOUNTER — Ambulatory Visit: Payer: BLUE CROSS/BLUE SHIELD | Admitting: Physical Therapy

## 2015-02-14 ENCOUNTER — Encounter: Payer: Self-pay | Admitting: Physical Therapy

## 2015-02-14 DIAGNOSIS — M6281 Muscle weakness (generalized): Secondary | ICD-10-CM | POA: Diagnosis not present

## 2015-02-14 DIAGNOSIS — Z96651 Presence of right artificial knee joint: Secondary | ICD-10-CM

## 2015-02-14 NOTE — Therapy (Signed)
[facility] [address] [place], [place], [place], [address] Phone: [phone]   Fax:  [phone]  Physical Therapy Treatment  Patient Details  Name: Roger Ayers MRN: [id number] Date of Birth: 1966/02/07 Referring Provider:  Marton Redwood, MD  Encounter Date: 02/14/2015    Past Medical History  Diagnosis Date  . Hypertension   . GERD (gastroesophageal reflux disease)   . Sleep apnea     cpap  . Arthritis   . MRSA infection     right leg    Past Surgical History  Procedure Laterality Date  . Mouth surgery  2014    bone grafts for implants  . Trigger finger release Right ~2013    middle finger  . External fixation leg Right 11/17/2013    Procedure: EXTERNAL FIXATION LEG;  Surgeon: Marianna Payment, MD;  [facility];  Service: Orthopedics;  Laterality: Right;  . Orif tibia plateau Right 11/23/2013    Procedure: OPEN REDUCTION INTERNAL FIXATION (ORIF) RIGHT TIBIAL PLATEAU;  Surgeon: Mcarthur Rossetti, MD;  [facility];  Service: Orthopedics;  Laterality: Right;  . External fixation removal Right 11/23/2013    Procedure: REMOVAL EXTERNAL FIXATION RIGHT LEG;  Surgeon: Mcarthur Rossetti, MD;  [facility];  Service: Orthopedics;  Laterality: Right;  . I&d extremity Right 12/05/2013    Procedure: IRRIGATION AND DEBRIDEMENT RIGHT LEG;  Surgeon: Mcarthur Rossetti, MD;  Location: [place];  Service: Orthopedics;  Laterality: Right;  . I&d extremity Right 12/08/2013    Procedure: Repeat IRRIGATION AND DEBRIDEMENT Right Leg with Stimulan bead placement;  Surgeon: Mcarthur Rossetti, MD;  Location: [place];  Service: Orthopedics;  Laterality: Right;  . Knee arthroscopy Right 03/08/2014    Procedure:  arthroscopy RIGHT KNEE WITH EXTENSIVE DEBRIDEMENT;  Surgeon: Mcarthur Rossetti, MD;  [facility];  Service: Orthopedics;  Laterality: Right;  . Hardware removal Right 03/08/2014    Procedure:  Removal plate and screws right tibia,;  Surgeon: Mcarthur Rossetti, MD;  [facility];  Service: Orthopedics;  Laterality: Right;  . Total knee arthroplasty Right 12/24/2014    dr Ninfa Linden  . Total knee arthroplasty Right 12/24/2014    Procedure: RIGHT TOTAL KNEE ARTHROPLASTY;  Surgeon: Mcarthur Rossetti, MD;  Location: [facility];  Service: Orthopedics;  Laterality: Right;    There were no vitals taken for this visit.  Visit Diagnosis:  No diagnosis found.      Subjective Assessment - 02/14/15 1043    Symptoms Pt reports going back to work on march 7th for half day, pain on lat side of right leg                     [facility] Adult PT Treatment/Exercise - 02/14/15 0001    Knee/Hip Exercises: Aerobic   Stationary Bike level 2 x 12   level 2 x 12 min   Knee/Hip Exercises: Standing   Lateral Step Up Right  4" 1 x10, 6" 2 x 10 with occasionally UE support    Forward Step Up Right  4" x10, 6" 2 x10   Knee/Hip Exercises: Supine   Straight Leg Raises Right;Other (comment)  3# 3 x 10   Knee/Hip Exercises: Sidelying   Hip ABduction Right  2 # 3 x 10   Modalities   Modalities Cryotherapy   Cryotherapy   Number Minutes Cryotherapy 15 Minutes   Cryotherapy Location Knee  Rt   Type of Cryotherapy Other (comment)  Game Ready   Manual Therapy   Manual Therapy --   Myofascial Release to distal ITB   Other Manual Therapy STW to right Hamstrings in prone with PROM to decrease tightness  after STW, transfer sit to stand less pain                  PT Short Term Goals - 02/12/15 1054    PT SHORT TERM GOAL #1   Title be independent with initial HEP for contracting quadricep correctly   Time 4   Period Weeks   Status Achieved   PT SHORT TERM GOAL #2   Title Right knee extension AROM >/-4 degrees   Time 4   Period Weeks   Status Achieved   PT SHORT TERM GOAL #3   Title Right knee flexion PROM  >/= 100 degrees   Time 4   Period Weeks   Status Achieved            PT Long Term Goals - 02/12/15 1117    PT LONG TERM GOAL #1   Title demonstrate and /or verbalize techniques to reduce the risk of re-injury to include info on anti-inflammatory   Time 8   Period Weeks   Status Achieved   PT LONG TERM GOAL #2   Title be independent with advanced HEP for knee strengthening   Time 8   Period Weeks   Status New   PT LONG TERM GOAL #3   Title go up and down stairs with step over step pattern   Time 8   Period Weeks   Status New   PT LONG TERM GOAL #4   Title flex right knee >/= 115 degrees so he can get in and out of car easier  reports it is 50% easier   Time 8   Status Partially Met   PT LONG TERM GOAL #5   Title ambulate to work from the parking lot with minimal gait deficitis   Time 8   Period Weeks   Status New   Additional Long Term Goals   Additional Long Term Goals Yes   PT LONG TERM GOAL #6   Title right knee strength >/= 4+/5 so patient can return to daily activities   Time 8   Period Weeks   Status New               Problem List Patient Active Problem List   Diagnosis Date Noted  . Traumatic arthritis of right knee 12/24/2014  . Status post total right knee replacement 12/24/2014  . Retained orthopedic hardware right tibia; previous tibial plateau fracture 03/08/2014  . S/P right knee arthroscopy 03/08/2014  . Infection of right tibia post ORIF tibial plateau 12/05/2013  . Tibial plateau fracture 11/17/2013    NAUMANN-HOUEGNIFIO,Atiya Yera PTA 02/14/2015, 10:52 AM  [facility] [address] [place], [place], [place], [address] Phone: [phone]   Fax:  [phone]

## 2015-02-14 NOTE — Progress Notes (Signed)
Level 2 x 12 min

## 2015-02-17 ENCOUNTER — Ambulatory Visit: Payer: BLUE CROSS/BLUE SHIELD | Admitting: Physical Therapy

## 2015-02-17 DIAGNOSIS — M6281 Muscle weakness (generalized): Secondary | ICD-10-CM | POA: Diagnosis not present

## 2015-02-17 DIAGNOSIS — Z96651 Presence of right artificial knee joint: Secondary | ICD-10-CM

## 2015-02-17 NOTE — Therapy (Signed)
[facility] [address] [place], [place], [place], [address] Phone: [phone]   Fax:  [phone]  Physical Therapy Treatment  Patient Details  Name: Roger Ayers MRN: [id number] Date of Birth: 10/31/1966 Referring Provider:  Marton Redwood, MD  Encounter Date: 02/17/2015      PT End of Session - 02/17/15 1557    PT Start Time 7482   PT Stop Time 1550   PT Time Calculation (min) 65 min      Past Medical History  Diagnosis Date  . Hypertension   . GERD (gastroesophageal reflux disease)   . Sleep apnea     cpap  . Arthritis   . MRSA infection     right leg    Past Surgical History  Procedure Laterality Date  . Mouth surgery  2014    bone grafts for implants  . Trigger finger release Right ~2013    middle finger  . External fixation leg Right 11/17/2013    Procedure: EXTERNAL FIXATION LEG;  Surgeon: Marianna Payment, MD;  [facility]: [facility];  Service: Orthopedics;  Laterality: Right;  . Orif tibia plateau Right 11/23/2013    Procedure: OPEN REDUCTION INTERNAL FIXATION (ORIF) RIGHT TIBIAL PLATEAU;  Surgeon: Mcarthur Rossetti, MD;  [facility];  Service: Orthopedics;  Laterality: Right;  . External fixation removal Right 11/23/2013    Procedure: REMOVAL EXTERNAL FIXATION RIGHT LEG;  Surgeon: Mcarthur Rossetti, MD;  [facility];  Service: Orthopedics;  Laterality: Right;  . I&d extremity Right 12/05/2013    Procedure: IRRIGATION AND DEBRIDEMENT RIGHT LEG;  Surgeon: Mcarthur Rossetti, MD;  Location: [place];  Service: Orthopedics;  Laterality: Right;  . I&d extremity Right 12/08/2013    Procedure: Repeat IRRIGATION AND DEBRIDEMENT Right Leg with Stimulan bead placement;  Surgeon: Mcarthur Rossetti, MD;  Location: [place];  Service: Orthopedics;  Laterality: Right;  . Knee arthroscopy Right 03/08/2014    Procedure:  arthroscopy RIGHT KNEE WITH EXTENSIVE DEBRIDEMENT;  Surgeon: Mcarthur Rossetti, MD;  [facility];  Service: Orthopedics;  Laterality: Right;  . Hardware removal Right 03/08/2014    Procedure: Removal plate and screws right tibia,;  Surgeon: Mcarthur Rossetti, MD;  [facility];  Service: Orthopedics;  Laterality: Right;  . Total knee arthroplasty Right 12/24/2014    dr Ninfa Linden  . Total knee arthroplasty Right 12/24/2014    Procedure: RIGHT TOTAL KNEE ARTHROPLASTY;  Surgeon: Mcarthur Rossetti, MD;  Location: [place];  Service: Orthopedics;  Laterality: Right;    There were no vitals taken for this visit.  Visit Diagnosis:  Status post total right knee replacement      Subjective Assessment - 02/17/15 1454    Symptoms Patient reports her hurt his right knee on Saturday.  He jarred his right leg when coming off the step to get his dog out of tanglement of another dog.  Patient reports he had increased pain Saturday and Sunday.    Limitations Walking;House hold activities   How long can you sit comfortably? 20 minutes. pain with going from sitting to standing   How long can you stand comfortably? 10-15 minutes   How long can you walk comfortably? 20 minutes with ache at end in right knee   Patient Stated Goals no pain, return to activities   Currently in Pain? Yes   Pain Score 6    Pain Location Knee   Pain Orientation Right   Pain Descriptors / Indicators Dull;Aching  Sharp pain along the lateral hamstring area   Pain Type Surgical pain;Chronic pain   Pain Onset More than a month ago   Aggravating Factors  standing, sitting, laying to turn in bed, too much walking   Pain Relieving Factors ice, heat, and resting with leg elevated   Effect of Pain on Daily Activities makes daily activities difficulty   Multiple Pain Sites No          [facility] PT Assessment - 02/17/15 0001    AROM   Right Knee Extension 0                  [facility] Adult PT Treatment/Exercise - 02/17/15 0001    Exercises   Exercises Ankle   right heel raises  on step 20 reps   Knee/Hip Exercises: Aerobic   Stationary Bike level 2 x 12   level 2 x 12 min   Knee/Hip Exercises: Machines for Strengthening   Cybex Leg Press St 8, 2 legs 80 pounds,2x15; right leg 40 pounds 2x15  increased weight   Knee/Hip Exercises: Standing   Lateral Step Up Right  4" 1 x10, 6" 2 x 10 with occasionally UE support    Forward Step Up Right  4" x10, 6" 2 x10   Step Down Right;10 reps;Step Height: 4"  increased step to 4 inch   Step Down Limitations needs something to hold on due to occasional buckling   Knee/Hip Exercises: Seated   Other Seated Knee Exercises knee flexion 30 pounds 20 reps  difficulty at end range   Other Seated Knee Exercises knee extension 15 pounds 20 reps   Modalities   Modalities Cryotherapy   Cryotherapy   Cryotherapy Location Knee   Type of Cryotherapy Other (comment)  Vasopnuematic device 15 minutes right knee elevated   Manual Therapy   Manual Therapy Passive ROM  Flexion and extension for right knee   Other Manual Therapy soft tissue work to posterior lateral right knee and hamstring, peroneal muscle                PT Education - 02/17/15 1538    Education provided No          PT Short Term Goals - 02/12/15 1054    PT SHORT TERM GOAL #1   Title be independent with initial HEP for contracting quadricep correctly   Time 4   Period Weeks   Status Achieved   PT SHORT TERM GOAL #2   Title Right knee extension AROM >/-4 degrees   Time 4   Period Weeks   Status Achieved   PT SHORT TERM GOAL #3   Title Right knee flexion PROM  >/= 100 degrees   Time 4   Period Weeks   Status Achieved           PT Long Term Goals - 02/17/15 1541    PT LONG TERM GOAL #1   Time 8   PT LONG TERM GOAL #2   Title --  continues to learn new strengthening exercises   Time 8   Period Weeks   Status New   PT LONG TERM GOAL #3   Title go up and down stairs with step over step pattern  now able to do 4 inch step going down    Time 8   Period Weeks   Status New   PT LONG TERM GOAL #4   Title flex right knee >/= 115 degrees so he can get in and out  of car easier   Time 8   Status On-going   PT LONG TERM GOAL #5   Title ambulate to work from the parking lot with minimal gait deficitis  walks with his right leg going out laterally   Time 8   Period Weeks   Status New   PT LONG TERM GOAL #6   Title right knee strength >/= 4+/5 so patient can return to daily activities   Time 8   Period Weeks   Status On-going               Plan - 02/17/15 1539    Clinical Impression Statement Patient is able to tolerate increased steps with down, muscle spasm in right lateral hamstring and peroneal muscle, swings his right leg lateraly while walking   Pt will benefit from skilled therapeutic intervention in order to improve on the following deficits Impaired flexibility;Decreased range of motion;Increased edema;Pain;Decreased strength;Decreased mobility   Rehab Potential Good   PT Frequency 2x / week   PT Duration 8 weeks   PT Treatment/Interventions Therapeutic activities;Patient/family education;Therapeutic exercise;Passive range of motion;Manual techniques;Balance training;Gait training   PT Next Visit Plan work on one legged stance activities, review HEP   PT Home Exercise Plan review HEP   Consulted and Agree with Plan of Care Patient        Problem List Patient Active Problem List   Diagnosis Date Noted  . Traumatic arthritis of right knee 12/24/2014  . Status post total right knee replacement 12/24/2014  . Retained orthopedic hardware right tibia; previous tibial plateau fracture 03/08/2014  . S/P right knee arthroscopy 03/08/2014  . Infection of right tibia post ORIF tibial plateau 12/05/2013  . Tibial plateau fracture 11/17/2013    Sheriece Jefcoat, PT 02/17/2015, 3:58 PM  [facility] [address] [place], [place], [place], [address] Phone:  [phone]   Fax:  [phone]

## 2015-02-19 ENCOUNTER — Encounter: Payer: Self-pay | Admitting: Physical Therapy

## 2015-02-19 ENCOUNTER — Ambulatory Visit: Payer: BLUE CROSS/BLUE SHIELD | Admitting: Physical Therapy

## 2015-02-19 DIAGNOSIS — M6281 Muscle weakness (generalized): Secondary | ICD-10-CM | POA: Diagnosis not present

## 2015-02-19 DIAGNOSIS — R531 Weakness: Secondary | ICD-10-CM

## 2015-02-19 DIAGNOSIS — R609 Edema, unspecified: Secondary | ICD-10-CM

## 2015-02-19 NOTE — Therapy (Signed)
[facility] [address], [place], [place], [address] Phone: [phone]   Fax:  [phone]  Physical Therapy Treatment  Patient Details  Name: Roger Ayers MRN: [id number] Date of Birth: 03/22/1966 Referring Provider:  Marton Redwood, MD  Encounter Date: 02/19/2015      PT End of Session - 02/19/15 1539    Visit Number 12   Number of Visits 16   Date for PT Re-Evaluation 03/12/15   PT Start Time 4010   PT Stop Time 1555   PT Time Calculation (min) 70 min   Activity Tolerance Patient tolerated treatment well   Behavior During Therapy [facility] for tasks assessed/performed      Past Medical History  Diagnosis Date  . Hypertension   . GERD (gastroesophageal reflux disease)   . Sleep apnea     cpap  . Arthritis   . MRSA infection     right leg    Past Surgical History  Procedure Laterality Date  . Mouth surgery  2014    bone grafts for implants  . Trigger finger release Right ~2013    middle finger  . External fixation leg Right 11/17/2013    Procedure: EXTERNAL FIXATION LEG;  Surgeon: Marianna Payment, MD;  [facility];  Service: Orthopedics;  Laterality: Right;  . Orif tibia plateau Right 11/23/2013    Procedure: OPEN REDUCTION INTERNAL FIXATION (ORIF) RIGHT TIBIAL PLATEAU;  Surgeon: Mcarthur Rossetti, MD;  [facility];  Service: Orthopedics;  Laterality: Right;  . External fixation removal Right 11/23/2013    Procedure: REMOVAL EXTERNAL FIXATION RIGHT LEG;  Surgeon: Mcarthur Rossetti, MD;  [facility];  Service: Orthopedics;  Laterality: Right;  . I&d extremity Right 12/05/2013    Procedure: IRRIGATION AND DEBRIDEMENT RIGHT LEG;  Surgeon: Mcarthur Rossetti, MD;  Location: [place];  Service: Orthopedics;  Laterality: Right;  . I&d extremity Right 12/08/2013    Procedure: Repeat IRRIGATION AND DEBRIDEMENT Right Leg with Stimulan bead placement;  Surgeon: Mcarthur Rossetti,  MD;  Location: [place];  Service: Orthopedics;  Laterality: Right;  . Knee arthroscopy Right 03/08/2014    Procedure:  arthroscopy RIGHT KNEE WITH EXTENSIVE DEBRIDEMENT;  Surgeon: Mcarthur Rossetti, MD;  [facility];  Service: Orthopedics;  Laterality: Right;  . Hardware removal Right 03/08/2014    Procedure: Removal plate and screws right tibia,;  Surgeon: Mcarthur Rossetti, MD;  [facility];  Service: Orthopedics;  Laterality: Right;  . Total knee arthroplasty Right 12/24/2014    dr Ninfa Linden  . Total knee arthroplasty Right 12/24/2014    Procedure: RIGHT TOTAL KNEE ARTHROPLASTY;  Surgeon: Mcarthur Rossetti, MD;  Location: [place];  Service: Orthopedics;  Laterality: Right;    There were no vitals taken for this visit.  Visit Diagnosis:  Edema  Decreased strength      Subjective Assessment - 02/19/15 1448    Symptoms Stiff today. some soreness   Currently in Pain? Yes   Pain Score 5    Pain Location Knee   Pain Orientation Right   Pain Descriptors / Indicators Sore;Aching   Pain Type Surgical pain   Aggravating Factors  standing too long, sitting too long, too much walking   Pain Relieving Factors ice, meds, rest   Multiple Pain Sites No                    [facility] Adult PT Treatment/Exercise - 02/19/15 0001    Knee/Hip  Exercises: Aerobic   Stationary Bike L2 x10 min   Elliptical Ramp1 Resistance 2 x3 min   Knee/Hip Exercises: Machines for Strengthening   Cybex Leg Press St 8, Bil 80# 10x, 85# 10x 2, Sinlge leg 40# 15x, 45# x15   Knee/Hip Exercises: Standing   Lateral Step Up 3 sets;10 reps;Step Height: 6"   Forward Step Up 3 sets;10 reps  6 inch box   Step Down 2 sets;10 reps;Other (comment)  Did on stairs, better control at quad   Walking with Sports Cord 30# 10x each direction   Knee/Hip Exercises: Seated   Other Seated Knee Exercises 30# Bil 2x10  Verbal cues to work end range   Other Seated Knee Exercises 15# Bil 10x   2x10    Modalities   Modalities Cryotherapy   Cryotherapy   Number Minutes Cryotherapy 15 Minutes   Cryotherapy Location Knee   Type of Cryotherapy Other (comment)   Manual Therapy   Manual Therapy --  Game ready, tried high compression 3 flakes                  PT Short Term Goals - 02/12/15 1054    PT SHORT TERM GOAL #1   Title be independent with initial HEP for contracting quadricep correctly   Time 4   Period Weeks   Status Achieved   PT SHORT TERM GOAL #2   Title Right knee extension AROM >/-4 degrees   Time 4   Period Weeks   Status Achieved   PT SHORT TERM GOAL #3   Title Right knee flexion PROM  >/= 100 degrees   Time 4   Period Weeks   Status Achieved           PT Long Term Goals - 02/17/15 1541    PT LONG TERM GOAL #1   Time 8   PT LONG TERM GOAL #2   Title --  continues to learn new strengthening exercises   Time 8   Period Weeks   Status New   PT LONG TERM GOAL #3   Title go up and down stairs with step over step pattern  now able to do 4 inch step going down   Time 8   Period Weeks   Status New   PT LONG TERM GOAL #4   Title flex right knee >/= 115 degrees so he can get in and out of car easier   Time 8   Status On-going   PT LONG TERM GOAL #5   Title ambulate to work from the parking lot with minimal gait deficitis  walks with his right leg going out laterally   Time 8   Period Weeks   Status New   PT LONG TERM GOAL #6   Title right knee strength >/= 4+/5 so patient can return to daily activities   Time 8   Period Weeks   Status On-going               Plan - 02/19/15 1541    Clinical Impression Statement Performed better step downs today, better eccentric control. Added elipitcal today.   Pt will benefit from skilled therapeutic intervention in order to improve on the following deficits Impaired flexibility;Decreased range of motion;Increased edema;Pain;Decreased strength;Decreased mobility   Rehab Potential Good   PT  Frequency 2x / week   PT Duration 8 weeks   PT Treatment/Interventions Therapeutic activities;Patient/family education;Therapeutic exercise;Passive range of motion;Manual techniques;Balance training;Gait training   PT Next Visit Plan  Continue with knee strength, eccentric strength   Consulted and Agree with Plan of Care Patient        Problem List Patient Active Problem List   Diagnosis Date Noted  . Traumatic arthritis of right knee 12/24/2014  . Status post total right knee replacement 12/24/2014  . Retained orthopedic hardware right tibia; previous tibial plateau fracture 03/08/2014  . S/P right knee arthroscopy 03/08/2014  . Infection of right tibia post ORIF tibial plateau 12/05/2013  . Tibial plateau fracture 11/17/2013    Janyia Guion,PTA 02/19/2015, 3:44 PM  [facility] [address], [place], [place], [address] Phone: [phone]   Fax:  [phone]

## 2015-02-24 ENCOUNTER — Encounter: Payer: Self-pay | Admitting: Physical Therapy

## 2015-02-24 ENCOUNTER — Ambulatory Visit: Payer: BLUE CROSS/BLUE SHIELD | Admitting: Physical Therapy

## 2015-02-24 DIAGNOSIS — Z96651 Presence of right artificial knee joint: Secondary | ICD-10-CM

## 2015-02-24 DIAGNOSIS — M6281 Muscle weakness (generalized): Secondary | ICD-10-CM | POA: Diagnosis not present

## 2015-02-24 DIAGNOSIS — R609 Edema, unspecified: Secondary | ICD-10-CM

## 2015-02-24 DIAGNOSIS — R531 Weakness: Secondary | ICD-10-CM

## 2015-02-24 NOTE — Patient Instructions (Addendum)
Hip Extension (Prone)   Lift right leg 3 ____ inches from floor, keeping knee locked. Repeat __10__ times per set. Do __3__ sets per session. Do __2-3__ sessions per day.  http://orth.exer.us/98   Copyright  VHI. All rights reserved.  Hip Adduction: Leg Lift (Eccentric) - Side-Lying   Lie on side with top leg bent, foot flat behind lower leg. Quickly lift lower leg. Slowly lower for . 10___ reps per set, _2-3__ sets per day, _5__ days per week. Add _x__ lbs when you achieve __x_ repetitions.  Copyright  VHI. All rights reserved.  Abduction: Side Leg Lift (Eccentric) - Side-Lying   Lie on side. Lift right leg slightly higher than shoulder level. Keep top right leg straight with body, toes pointing forward. Slowly lower  _10__ reps per set, _x3__ sets per day, 5___ days per week. Add _x__ lbs when you achieve x___ repetitions.  Copyright  VHI. All rights reserved.  Strengthening: Straight Leg Raise (Phase 1)   Tighten muscles on front of right thigh, then lift right leg up to level of opposite knee _keeping knee locked.  Repeat _10___ times per set. Do __3__ sets per session. Do __5__ sessions per day.  http://orth.exer.us/614   Copyright  VHI. All rights reserved.     Copyright  VHI. All rights reserved.

## 2015-02-24 NOTE — Therapy (Signed)
[facility] [address] [place], [place], [address] Phone: [phone]   Fax:  [phone]  Physical Therapy Treatment  Patient Details  Name: Roger Ayers MRN: [id number] Date of Birth: October 31, 1966 Referring Provider:  Marton Redwood, MD  Encounter Date: 02/24/2015      PT End of Session - 02/24/15 1123    Visit Number 13   Number of Visits 16   Date for PT Re-Evaluation 03/12/15   PT Start Time 1100   PT Stop Time 1204   PT Time Calculation (min) 64 min   Activity Tolerance Patient tolerated treatment well   Behavior During Therapy [facility] for tasks assessed/performed      Past Medical History  Diagnosis Date  . Hypertension   . GERD (gastroesophageal reflux disease)   . Sleep apnea     cpap  . Arthritis   . MRSA infection     right leg    Past Surgical History  Procedure Laterality Date  . Mouth surgery  2014    bone grafts for implants  . Trigger finger release Right ~2013    middle finger  . External fixation leg Right 11/17/2013    Procedure: EXTERNAL FIXATION LEG;  Surgeon: Marianna Payment, MD;  [facility];  Service: Orthopedics;  Laterality: Right;  . Orif tibia plateau Right 11/23/2013    Procedure: OPEN REDUCTION INTERNAL FIXATION (ORIF) RIGHT TIBIAL PLATEAU;  Surgeon: Mcarthur Rossetti, MD;  [facility];  Service: Orthopedics;  Laterality: Right;  . External fixation removal Right 11/23/2013    Procedure: REMOVAL EXTERNAL FIXATION RIGHT LEG;  Surgeon: Mcarthur Rossetti, MD;  [facility];  Service: Orthopedics;  Laterality: Right;  . I&d extremity Right 12/05/2013    Procedure: IRRIGATION AND DEBRIDEMENT RIGHT LEG;  Surgeon: Mcarthur Rossetti, MD;  Location: [place];  Service: Orthopedics;  Laterality: Right;  . I&d extremity Right 12/08/2013    Procedure: Repeat IRRIGATION AND DEBRIDEMENT Right Leg with Stimulan bead placement;  Surgeon: Mcarthur Rossetti,  MD;  Location: [place];  Service: Orthopedics;  Laterality: Right;  . Knee arthroscopy Right 03/08/2014    Procedure:  arthroscopy RIGHT KNEE WITH EXTENSIVE DEBRIDEMENT;  Surgeon: Mcarthur Rossetti, MD;  [facility];  Service: Orthopedics;  Laterality: Right;  . Hardware removal Right 03/08/2014    Procedure: Removal plate and screws right tibia,;  Surgeon: Mcarthur Rossetti, MD;  [facility];  Service: Orthopedics;  Laterality: Right;  . Total knee arthroplasty Right 12/24/2014    dr Ninfa Linden  . Total knee arthroplasty Right 12/24/2014    Procedure: RIGHT TOTAL KNEE ARTHROPLASTY;  Surgeon: Mcarthur Rossetti, MD;  Location: [place];  Service: Orthopedics;  Laterality: Right;    There were no vitals taken for this visit.  Visit Diagnosis:  Decreased strength  Status post total right knee replacement  Edema      Subjective Assessment - 02/24/15 1105    Symptoms stiffness & sorness, it seems to hurt more at night   Limitations Walking;House hold activities   How long can you sit comfortably? 20 minutes   How long can you stand comfortably? 20 min   How long can you walk comfortably? 20 min with ache at end of task, in Rt knee   Diagnostic tests none   Patient Stated Goals no pain, return to activities   Currently in Pain? Yes   Pain Score 3    Pain Location Knee   Pain  Orientation Right   Pain Descriptors / Indicators Aching;Sore   Pain Type Surgical pain   Pain Onset More than a month ago   Pain Frequency Intermittent   Aggravating Factors  prolonged standing, sitting or walking   Pain Relieving Factors ice, meds, rest   Effect of Pain on Daily Activities makes daily activities difficult   Multiple Pain Sites No          [facility] PT Assessment - 02/24/15 0001    Assessment   Medical Diagnosis Rt TKA   AROM   Right Knee Extension -3   Right Knee Flexion 105                  [facility] Adult PT Treatment/Exercise - 02/24/15 0001    Exercises    Exercises Knee/Hip;Ankle   Knee/Hip Exercises: Aerobic   Stationary Bike L2 x 10   Elliptical 33min, ramp 2, incline 5   Knee/Hip Exercises: Machines for Strengthening   Cybex Leg Press St 8, B LE 85# 3x10, 45# Rt LE 3 x10   Knee/Hip Exercises: Standing   Walking with Sports Cord 45# 10x 4 directions   Knee/Hip Exercises: Supine   Quad Sets AROM  reviewed quad sets & remindet pt. continue as HEP   Other Supine Knee Exercises Reviewed SLR, handout given   Modalities   Modalities Cryotherapy   Cryotherapy   Number Minutes Cryotherapy 15 Minutes   Cryotherapy Location Knee   Type of Cryotherapy Other (comment)  Vasopneumatic device, high compression, 3 snowflakes                PT Education - 02/24/15 1201    Education provided Yes   Education Details hip SLR   Methods Handout          PT Short Term Goals - 02/12/15 1054    PT SHORT TERM GOAL #1   Title be independent with initial HEP for contracting quadricep correctly   Time 4   Period Weeks   Status Achieved   PT SHORT TERM GOAL #2   Title Right knee extension AROM >/-4 degrees   Time 4   Period Weeks   Status Achieved   PT SHORT TERM GOAL #3   Title Right knee flexion PROM  >/= 100 degrees   Time 4   Period Weeks   Status Achieved           PT Long Term Goals - 02/24/15 1124    PT LONG TERM GOAL #3   Title --  unable to perfrom yet due to weakness, less control  & buckeling of knee   PT LONG TERM GOAL #4   Title --  AROM Rt knee flexion 105, extension -4               Plan - 02/24/15 1135    Clinical Impression Statement Continue with strength, eccentric control. Continue eliptical   Pt will benefit from skilled therapeutic intervention in order to improve on the following deficits Impaired flexibility;Decreased range of motion;Increased edema;Pain;Decreased strength;Decreased mobility   Rehab Potential Good   Clinical Impairments Affecting Rehab Potential None   PT Frequency 2x /  week   PT Duration 8 weeks   PT Treatment/Interventions Therapeutic activities;Patient/family education;Therapeutic exercise;Passive range of motion;Manual techniques;Balance training;Gait training   PT Next Visit Plan Continue with knee strength, eccentric strength   PT Home Exercise Plan review HEP   Consulted and Agree with Plan of Care Patient  Problem List Patient Active Problem List   Diagnosis Date Noted  . Traumatic arthritis of right knee 12/24/2014  . Status post total right knee replacement 12/24/2014  . Retained orthopedic hardware right tibia; previous tibial plateau fracture 03/08/2014  . S/P right knee arthroscopy 03/08/2014  . Infection of right tibia post ORIF tibial plateau 12/05/2013  . Tibial plateau fracture 11/17/2013    NAUMANN-HOUEGNIFIO,Valdemar Mcclenahan PTA  02/24/2015, 12:14 PM  [facility] [address], [place], [place], [address] Phone: [phone]   Fax:  [phone]

## 2015-02-26 ENCOUNTER — Encounter: Payer: Self-pay | Admitting: Physical Therapy

## 2015-02-26 ENCOUNTER — Ambulatory Visit: Payer: BLUE CROSS/BLUE SHIELD | Attending: Orthopaedic Surgery | Admitting: Physical Therapy

## 2015-02-26 DIAGNOSIS — Z96651 Presence of right artificial knee joint: Secondary | ICD-10-CM

## 2015-02-26 DIAGNOSIS — R531 Weakness: Secondary | ICD-10-CM

## 2015-02-26 DIAGNOSIS — R609 Edema, unspecified: Secondary | ICD-10-CM | POA: Diagnosis not present

## 2015-02-26 DIAGNOSIS — M6281 Muscle weakness (generalized): Secondary | ICD-10-CM | POA: Insufficient documentation

## 2015-02-26 NOTE — Patient Instructions (Addendum)
Body-Weight Step-Up: Stable (Active)   Head up, back flat, place one foot on step. Bring other leg up on to step. Complete _10__ sets of __3_ repetitions. Perform _3__ sessions per day.  http://gtsc.exer.us/512   Copyright  VHI. All rights reserved.  Functional Quadriceps: Sit to Stand   Sit on edge of chair, feet flat on floor. Stand upright, extending knees fully. Repeat __10__ times per set. Do _3___ sets per session. Do _3___ sessions per day.  http://orth.exer.us/734   Copyright  VHI. All rights reserved.  Strengthening: Wall Slide   Leaning on wall, slowly lower buttocks until thighs are parallel to floor. Hold 20sec incr 30 seconds. Tighten thigh muscles and return. Repeat ___3_ times per set. Do _1___ sets per session. Do ____ sessions per day.  http://orth.exer.us/630   Copyright  VHI. All rights reserved.

## 2015-02-26 NOTE — Therapy (Signed)
[facility] [address], [place], [place], [address] Phone: [phone]   Fax:  [phone]  Physical Therapy Treatment  Patient Details  Name: Roger Ayers MRN: [id number] Date of Birth: 1966-01-30 Referring Provider:  Marton Redwood, MD  Encounter Date: 02/26/2015      PT End of Session - 02/26/15 1152    Activity Tolerance Patient tolerated treatment well   Behavior During Therapy [facility] for tasks assessed/performed      Past Medical History  Diagnosis Date  . Hypertension   . GERD (gastroesophageal reflux disease)   . Sleep apnea     cpap  . Arthritis   . MRSA infection     right leg    Past Surgical History  Procedure Laterality Date  . Mouth surgery  2014    bone grafts for implants  . Trigger finger release Right ~2013    middle finger  . External fixation leg Right 11/17/2013    Procedure: EXTERNAL FIXATION LEG;  Surgeon: Marianna Payment, MD;  [facility]: [facility];  Service: Orthopedics;  Laterality: Right;  . Orif tibia plateau Right 11/23/2013    Procedure: OPEN REDUCTION INTERNAL FIXATION (ORIF) RIGHT TIBIAL PLATEAU;  Surgeon: Mcarthur Rossetti, MD;  [facility]: [facility];  Service: Orthopedics;  Laterality: Right;  . External fixation removal Right 11/23/2013    Procedure: REMOVAL EXTERNAL FIXATION RIGHT LEG;  Surgeon: Mcarthur Rossetti, MD;  [facility];  Service: Orthopedics;  Laterality: Right;  . I&d extremity Right 12/05/2013    Procedure: IRRIGATION AND DEBRIDEMENT RIGHT LEG;  Surgeon: Mcarthur Rossetti, MD;  Location: [place];  Service: Orthopedics;  Laterality: Right;  . I&d extremity Right 12/08/2013    Procedure: Repeat IRRIGATION AND DEBRIDEMENT Right Leg with Stimulan bead placement;  Surgeon: Mcarthur Rossetti, MD;  Location: [place];  Service: Orthopedics;  Laterality: Right;  . Knee arthroscopy Right 03/08/2014    Procedure:  arthroscopy RIGHT KNEE WITH EXTENSIVE  DEBRIDEMENT;  Surgeon: Mcarthur Rossetti, MD;  [facility];  Service: Orthopedics;  Laterality: Right;  . Hardware removal Right 03/08/2014    Procedure: Removal plate and screws right tibia,;  Surgeon: Mcarthur Rossetti, MD;  [facility];  Service: Orthopedics;  Laterality: Right;  . Total knee arthroplasty Right 12/24/2014    dr Ninfa Linden  . Total knee arthroplasty Right 12/24/2014    Procedure: RIGHT TOTAL KNEE ARTHROPLASTY;  Surgeon: Mcarthur Rossetti, MD;  Location: [place];  Service: Orthopedics;  Laterality: Right;    There were no vitals taken for this visit.  Visit Diagnosis:  Decreased strength  Status post total right knee replacement  Edema      Subjective Assessment - 02/26/15 1202    Symptoms stiffness & sorness, it seems to hurt more at night                    [facility] Adult PT Treatment/Exercise - 02/26/15 0001    Knee/Hip Exercises: Aerobic   Stationary Bike L5 x12   Elliptical 51min, ramp 2, incline 5  ramp 5, resistance 1   Knee/Hip Exercises: Machines for Strengthening   Cybex Leg Press St7  100# B LE 3x10,     Knee/Hip Exercises: Standing   Lateral Step Up 3 sets;10 reps  6" box, without UE support   Forward Step Up 3 sets;10 reps  R LE on 6"box   Step Down 3 sets;10 reps  6" step with pole for A  Knee/Hip Exercises: Seated   Other Seated Knee Exercises 40# Bil 3 x 10   Other Seated Knee Exercises 25# Bil 3x10   Modalities   Modalities Cryotherapy   Cryotherapy   Number Minutes Cryotherapy 15 Minutes   Cryotherapy Location Knee   Type of Cryotherapy Other (comment)  Vasopneumatic device, high compression, 3 snowflakes                PT Education - 02/26/15 1146    Education provided Yes   Education Details step-up, sit to stand    Person(s) Educated Patient   Methods Handout;Explanation   Comprehension Verbalized understanding;Returned demonstration          PT Short Term Goals - 02/12/15 1054     PT SHORT TERM GOAL #1   Title be independent with initial HEP for contracting quadricep correctly   Time 4   Period Weeks   Status Achieved   PT SHORT TERM GOAL #2   Title Right knee extension AROM >/-4 degrees   Time 4   Period Weeks   Status Achieved   PT SHORT TERM GOAL #3   Title Right knee flexion PROM  >/= 100 degrees   Time 4   Period Weeks   Status Achieved           PT Long Term Goals - 02/24/15 1124    PT LONG TERM GOAL #3   Title --  unable to perfrom yet due to weakness, less control  & buckeling of knee   PT LONG TERM GOAL #4   Title --  AROM Rt knee flexion 105, extension -4               Plan - 02/26/15 1103    Clinical Impression Statement Continue with strength, eccentric control, eliptical.    Pt will benefit from skilled therapeutic intervention in order to improve on the following deficits Impaired flexibility;Decreased range of motion;Increased edema;Pain;Decreased strength;Decreased mobility   Rehab Potential Good   Clinical Impairments Affecting Rehab Potential None   PT Frequency 2x / week   PT Duration 8 weeks   PT Treatment/Interventions Therapeutic activities;Patient/family education;Therapeutic exercise;Passive range of motion;Manual techniques;Balance training;Gait training   PT Next Visit Plan Writte MD progress note for MD visit on Thursday March 10th   PT Home Exercise Plan review HEP   Recommended Other Services none   Consulted and Agree with Plan of Care Patient        Problem List Patient Active Problem List   Diagnosis Date Noted  . Traumatic arthritis of right knee 12/24/2014  . Status post total right knee replacement 12/24/2014  . Retained orthopedic hardware right tibia; previous tibial plateau fracture 03/08/2014  . S/P right knee arthroscopy 03/08/2014  . Infection of right tibia post ORIF tibial plateau 12/05/2013  . Tibial plateau fracture 11/17/2013    NAUMANN-HOUEGNIFIO,Kelyse Pask PTA  02/26/2015, 1:32  PM  [facility] [address] [place], [place], [address] Phone: [phone]   Fax:  [phone]

## 2015-03-03 ENCOUNTER — Encounter: Payer: BLUE CROSS/BLUE SHIELD | Admitting: Physical Therapy

## 2015-03-04 ENCOUNTER — Encounter: Payer: Self-pay | Admitting: Physical Therapy

## 2015-03-04 ENCOUNTER — Ambulatory Visit: Payer: BLUE CROSS/BLUE SHIELD | Admitting: Physical Therapy

## 2015-03-04 DIAGNOSIS — Z96651 Presence of right artificial knee joint: Secondary | ICD-10-CM

## 2015-03-04 DIAGNOSIS — R609 Edema, unspecified: Secondary | ICD-10-CM

## 2015-03-04 DIAGNOSIS — M6281 Muscle weakness (generalized): Secondary | ICD-10-CM | POA: Diagnosis not present

## 2015-03-04 DIAGNOSIS — R531 Weakness: Secondary | ICD-10-CM

## 2015-03-04 NOTE — Therapy (Signed)
[facility] [address] [place], [place], [address] Phone: [phone]   Fax:  [phone]  Physical Therapy Treatment  Patient Details  Name: Roger Ayers MRN: [id number] Date of Birth: May 09, 1966 Referring Provider:  Marton Redwood, MD  Encounter Date: 03/04/2015      PT End of Session - 03/04/15 1630    Visit Number 15   Date for PT Re-Evaluation 03/12/15   Authorization - Number of Visits 30   PT Start Time 1740   PT Stop Time 1710   PT Time Calculation (min) 56 min   Activity Tolerance Patient tolerated treatment well   Behavior During Therapy [facility] for tasks assessed/performed      Past Medical History  Diagnosis Date  . Hypertension   . GERD (gastroesophageal reflux disease)   . Sleep apnea     cpap  . Arthritis   . MRSA infection     right leg    Past Surgical History  Procedure Laterality Date  . Mouth surgery  2014    bone grafts for implants  . Trigger finger release Right ~2013    middle finger  . External fixation leg Right 11/17/2013    Procedure: EXTERNAL FIXATION LEG;  Surgeon: Marianna Payment, MD;  [facility];  Service: Orthopedics;  Laterality: Right;  . Orif tibia plateau Right 11/23/2013    Procedure: OPEN REDUCTION INTERNAL FIXATION (ORIF) RIGHT TIBIAL PLATEAU;  Surgeon: Mcarthur Rossetti, MD;  [facility];  Service: Orthopedics;  Laterality: Right;  . External fixation removal Right 11/23/2013    Procedure: REMOVAL EXTERNAL FIXATION RIGHT LEG;  Surgeon: Mcarthur Rossetti, MD;  [facility];  Service: Orthopedics;  Laterality: Right;  . I&d extremity Right 12/05/2013    Procedure: IRRIGATION AND DEBRIDEMENT RIGHT LEG;  Surgeon: Mcarthur Rossetti, MD;  Location: [place];  Service: Orthopedics;  Laterality: Right;  . I&d extremity Right 12/08/2013    Procedure: Repeat IRRIGATION AND DEBRIDEMENT Right Leg with Stimulan bead placement;  Surgeon:  Mcarthur Rossetti, MD;  Location: [place];  Service: Orthopedics;  Laterality: Right;  . Knee arthroscopy Right 03/08/2014    Procedure:  arthroscopy RIGHT KNEE WITH EXTENSIVE DEBRIDEMENT;  Surgeon: Mcarthur Rossetti, MD;  [facility];  Service: Orthopedics;  Laterality: Right;  . Hardware removal Right 03/08/2014    Procedure: Removal plate and screws right tibia,;  Surgeon: Mcarthur Rossetti, MD;  [facility];  Service: Orthopedics;  Laterality: Right;  . Total knee arthroplasty Right 12/24/2014    dr Ninfa Linden  . Total knee arthroplasty Right 12/24/2014    Procedure: RIGHT TOTAL KNEE ARTHROPLASTY;  Surgeon: Mcarthur Rossetti, MD;  Location: [place];  Service: Orthopedics;  Laterality: Right;    There were no vitals taken for this visit.  Visit Diagnosis:  Decreased strength - Plan: PT plan of care cert/re-cert  Status post total right knee replacement - Plan: PT plan of care cert/re-cert  Edema - Plan: PT plan of care cert/re-cert      Subjective Assessment - 03/04/15 1617    Symptoms stiffness & sorness, it seems to hurt more at night   Limitations Walking;House hold activities;Standing   How long can you sit comfortably? 20 minutes   How long can you stand comfortably? 25-30   How long can you walk comfortably? 20 min with ache at end of task, in Rt knee   Patient Stated Goals no pain, return to activities, and work fulltime,  working part time since Monday March7th   Currently in Pain? Yes   Pain Score 3    Pain Location Knee   Pain Orientation Right   Pain Descriptors / Indicators Sore;Aching   Pain Type Surgical pain   Pain Onset More than a month ago   Pain Frequency Intermittent   Aggravating Factors  prolonged standing or walking, after sitting stiffness with first steps   Pain Relieving Factors ice, meds, rest   Effect of Pain on Daily Activities makes daily activities difficult, working only parttime sine yesterday   Multiple Pain Sites No           [facility] PT Assessment - 03/04/15 0001    Assessment   Onset Date 12/24/14   Precautions   Precautions None   Balance Screen   Has the patient fallen in the past 6 months No   Has the patient had a decrease in activity level because of a fear of falling?  No   Is the patient reluctant to leave their home because of a fear of falling?  No   Prior Function   Level of Independence Independent with basic ADLs   Observation/Other Assessments   Focus on Therapeutic Outcomes (FOTO)  66% limitation   ROM / Strength   AROM / PROM / Strength Strength  Rt knee flex /   ext   AROM   Right Knee Extension -3   Right Knee Flexion 110   PROM   PROM Assessment Site Knee   Right/Left Knee Right   Flexion 115, extension 0   Strength   Strength Assessment Site Knee   Right/Left Knee Right   Right Knee Flexion 4/5   Right Knee Extension 4/5   Ambulation/Gait   Ambulation/Gait Yes   Ambulation/Gait Assistance 7: Independent                  [facility] Adult PT Treatment/Exercise - 03/04/15 0001    Ambulation/Gait   Stairs Yes   Stair Management Technique Step to pattern  Pt with functional weakness recommendet step to pattern at h   Knee/Hip Exercises: Aerobic   Stationary Bike L5 x12   Elliptical 61min, ramp 2, incline 5  ramp 5, resistance 1   Knee/Hip Exercises: Machines for Strengthening   Cybex Leg Press St7  110# B LE 3x10,  60# 2x10, 501x10   Knee/Hip Exercises: Standing   Lateral Step Up 3 sets;10 reps  6" box, without UE support, attempted 8" box to difficult   Forward Step Up 3 sets;10 reps  R LE on 6"box   Step Down 3 sets;10 reps  6" step with pole for A   Modalities   Modalities Cryotherapy   Cryotherapy   Number Minutes Cryotherapy 15 Minutes   Cryotherapy Location Knee   Type of Cryotherapy Other (comment)  Vasopneumatic                PT Education - 03/05/15 0744    Education provided No          PT Short Term Goals - 03/04/15 1656     PT SHORT TERM GOAL #1   Title be independent with initial HEP for contracting quadricep correctly   Time 4   Period Weeks   Status Achieved   PT SHORT TERM GOAL #2   Title Right knee extension AROM >/-4 degrees   Time 4   Period Weeks   Status Achieved   PT SHORT TERM GOAL #3   Title  Right knee flexion PROM  >/= 100 degrees   Time 4   Period Weeks   Status Achieved           PT Long Term Goals - 03/04/15 1657    PT LONG TERM GOAL #1   Title demonstrate and /or verbalize techniques to reduce the risk of re-injury to include info on anti-inflammatory   Time 8   Period Weeks   Status Achieved   PT LONG TERM GOAL #4   Title AROM Rt knee flexion 105, extension -4  AROM Rt knee 115, extension -3   Time 8   Period Weeks   Status Achieved   PT LONG TERM GOAL #5   Title ambulate to work from the parking lot with minimal gait deficitis   Time 8   Period Weeks   Status On-going   PT LONG TERM GOAL #6   Title right knee strength >/= 4+/5 so patient can return to daily activities   Time 8   Period Weeks   Status On-going               Plan - 03/04/15 1648    Clinical Impression Statement Pt. continues to improve with strength and enduracne, but still with functional weakness with Rt knee resulting in buckeling 2-3 x a day     Pt will benefit from skilled therapeutic intervention in order to improve on the following deficits Impaired flexibility;Decreased range of motion;Increased edema;Pain;Decreased strength;Decreased mobility   Rehab Potential Good   Clinical Impairments Affecting Rehab Potential None   PT Frequency 2x / week   PT Duration 8 weeks   PT Treatment/Interventions Therapeutic activities;Patient/family education;Therapeutic exercise;Passive range of motion;Manual techniques;Balance training;Gait training   PT Next Visit Plan Wait what MD says   PT Home Exercise Plan continue with HEP   Consulted and Agree with Plan of Care Patient        Problem  List Patient Active Problem List   Diagnosis Date Noted  . Traumatic arthritis of right knee 12/24/2014  . Status post total right knee replacement 12/24/2014  . Retained orthopedic hardware right tibia; previous tibial plateau fracture 03/08/2014  . S/P right knee arthroscopy 03/08/2014  . Infection of right tibia post ORIF tibial plateau 12/05/2013  . Tibial plateau fracture 11/17/2013   Earlie Counts, PT 03/05/2015 7:47 AM  GRAY,CHERYL PTA 03/05/2015, 7:47 AM  [facility] [address] [place], [place], [address] Phone: [phone]   Fax:  [phone]

## 2015-03-05 ENCOUNTER — Encounter: Payer: BLUE CROSS/BLUE SHIELD | Admitting: Physical Therapy

## 2015-03-06 ENCOUNTER — Encounter: Payer: BLUE CROSS/BLUE SHIELD | Admitting: Physical Therapy

## 2015-03-10 ENCOUNTER — Encounter: Payer: BLUE CROSS/BLUE SHIELD | Admitting: Physical Therapy

## 2015-03-11 ENCOUNTER — Ambulatory Visit: Payer: BLUE CROSS/BLUE SHIELD | Admitting: Physical Therapy

## 2015-03-11 DIAGNOSIS — R531 Weakness: Secondary | ICD-10-CM

## 2015-03-11 DIAGNOSIS — M6281 Muscle weakness (generalized): Secondary | ICD-10-CM | POA: Diagnosis not present

## 2015-03-11 NOTE — Therapy (Signed)
[facility] [address] [place], [place], [address] Phone: [phone]   Fax:  [phone]  Physical Therapy Treatment  Patient Details  Name: Roger Ayers MRN: [id number] Date of Birth: [birth year] Referring Provider:  Mcarthur Rossetti*  Encounter Date: 03/11/2015      PT End of Session - 03/11/15 9622    Visit Number 16   Date for PT Re-Evaluation 04/09/15   Authorization - Number of Visits 30   PT Start Time 2979   PT Stop Time 1710   PT Time Calculation (min) 56 min   Activity Tolerance Patient tolerated treatment well   Behavior During Therapy --      Past Medical History  Diagnosis Date  . Hypertension   . GERD (gastroesophageal reflux disease)   . Sleep apnea     cpap  . Arthritis   . MRSA infection     right leg    Past Surgical History  Procedure Laterality Date  . Mouth surgery  2014    bone grafts for implants  . Trigger finger release Right ~2013    middle finger  . External fixation leg Right 11/17/2013    Procedure: EXTERNAL FIXATION LEG;  Surgeon: Marianna Payment, MD;  [facility];  Service: Orthopedics;  Laterality: Right;  . Orif tibia plateau Right 11/23/2013    Procedure: OPEN REDUCTION INTERNAL FIXATION (ORIF) RIGHT TIBIAL PLATEAU;  Surgeon: Mcarthur Rossetti, MD;  [facility];  Service: Orthopedics;  Laterality: Right;  . External fixation removal Right 11/23/2013    Procedure: REMOVAL EXTERNAL FIXATION RIGHT LEG;  Surgeon: Mcarthur Rossetti, MD;  [facility];  Service: Orthopedics;  Laterality: Right;  . I&d extremity Right 12/05/2013    Procedure: IRRIGATION AND DEBRIDEMENT RIGHT LEG;  Surgeon: Mcarthur Rossetti, MD;  Location: [place];  Service: Orthopedics;  Laterality: Right;  . I&d extremity Right 12/08/2013    Procedure: Repeat IRRIGATION AND DEBRIDEMENT Right Leg with Stimulan bead placement;  Surgeon: Mcarthur Rossetti, MD;   Location: [place];  Service: Orthopedics;  Laterality: Right;  . Knee arthroscopy Right 03/08/2014    Procedure:  arthroscopy RIGHT KNEE WITH EXTENSIVE DEBRIDEMENT;  Surgeon: Mcarthur Rossetti, MD;  [facility];  Service: Orthopedics;  Laterality: Right;  . Hardware removal Right 03/08/2014    Procedure: Removal plate and screws right tibia,;  Surgeon: Mcarthur Rossetti, MD;  [facility];  Service: Orthopedics;  Laterality: Right;  . Total knee arthroplasty Right 12/24/2014    dr Ninfa Linden  . Total knee arthroplasty Right 12/24/2014    Procedure: RIGHT TOTAL KNEE ARTHROPLASTY;  Surgeon: Mcarthur Rossetti, MD;  Location: [place];  Service: Orthopedics;  Laterality: Right;    There were no vitals filed for this visit.  Visit Diagnosis:  Decreased strength      Subjective Assessment - 03/11/15 1627    Symptoms I saw the doctor and he is happy with my progress. Patient is back to work part-time.  I have more pain in the afternoon.     Limitations Walking;Sitting;Standing;House hold activities   How long can you sit comfortably? 30 min   How long can you stand comfortably? 20 min.   How long can you walk comfortably? 30-40 min.    Patient Stated Goals no pain, return to activities, and work fulltime, working part time since Monday March7th   Currently in Pain? Yes   Pain Score 2   evening is 4-5/10  Pain Location Knee   Pain Orientation Right   Pain Descriptors / Indicators Sore;Aching   Pain Type Chronic pain   Pain Onset More than a month ago   Pain Frequency Intermittent   Aggravating Factors  bending right knee wrong,    Pain Relieving Factors ice, medication, rest   Effect of Pain on Daily Activities does activities slower   Multiple Pain Sites No            [facility] PT Assessment - 03/11/15 0001    AROM   Right Knee Extension -3   PROM   PROM Assessment Site Knee   Right/Left Knee Right   Strength   Right/Left Knee Right   Right Knee Flexion 4/5    Right Knee Extension 4+/5                   [facility] Adult PT Treatment/Exercise - 03/11/15 0001    Knee/Hip Exercises: Aerobic   Stationary Bike L5 x12   Knee/Hip Exercises: Machines for Strengthening   Cybex Leg Press St7  110# B LE 3x10,  60# 2x10, 501x10   Knee/Hip Exercises: Standing   Knee Flexion AROM;Right;20 reps  6 pounds   Lateral Step Up 3 sets;10 reps  6" box, without UE support, attempted 8" box to difficult   Knee/Hip Exercises: Seated   Long Arc Quad AROM;Right;20 reps;Weights  6 pounds working on mid range   Modalities   Modalities Cryotherapy   Cryotherapy   Number Minutes Cryotherapy 15 Minutes   Cryotherapy Location Knee   Type of Cryotherapy Other (comment)  vasopnuematic device elevated right knee   Manual Therapy   Manual Therapy Passive ROM   Passive ROM to right knee                PT Education - 03/11/15 1702    Education provided No          PT Short Term Goals - 03/04/15 1656    PT SHORT TERM GOAL #1   Title be independent with initial HEP for contracting quadricep correctly   Time 4   Period Weeks   Status Achieved   PT SHORT TERM GOAL #2   Title Right knee extension AROM >/-4 degrees   Time 4   Period Weeks   Status Achieved   PT SHORT TERM GOAL #3   Title Right knee flexion PROM  >/= 100 degrees   Time 4   Period Weeks   Status Achieved           PT Long Term Goals - 03/11/15 1633    PT LONG TERM GOAL #1   Title demonstrate and /or verbalize techniques to reduce the risk of re-injury to include info on anti-inflammatory   Time 8   Period Weeks   Status Achieved   PT LONG TERM GOAL #2   Title be independent with HEP   Time 8   Period Weeks   Status New  still learning how to progress his exercises   PT LONG TERM GOAL #3   Title go up and down steps with step over step pattern   Time 8   Period Weeks   Status New  step to step pattern   PT LONG TERM GOAL #4   Title AROM Rt knee flexion 105,  extension -4   Time 8   Period Weeks   Status Achieved   PT LONG TERM GOAL #5   Title ambulate to work from the parking  lot with minimal gait deficitis   Time 8   Period Weeks   Status On-going  easier to walk in the morning   PT LONG TERM GOAL #6   Title right knee strength >/= 4+/5 so patient can return to daily activities   Time 8   Period Weeks   Status On-going               Plan - 03/11/15 1703    Clinical Impression Statement Patient has improved right knee range of motion.  Patient has decreased eccentric control of right quads for when going down the stairs.    Pt will benefit from skilled therapeutic intervention in order to improve on the following deficits Impaired flexibility;Decreased range of motion;Increased edema;Pain;Decreased strength;Decreased mobility   Rehab Potential Good   Clinical Impairments Affecting Rehab Potential None   PT Frequency 2x / week   PT Duration 8 weeks   PT Treatment/Interventions Therapeutic activities;Patient/family education;Therapeutic exercise;Passive range of motion;Manual techniques;Balance training;Gait training   PT Next Visit Plan work on right knee eccentric control   PT Home Exercise Plan Review and update HEP   Recommended Other Services None   Consulted and Agree with Plan of Care Patient        Problem List Patient Active Problem List   Diagnosis Date Noted  . Traumatic arthritis of right knee 12/24/2014  . Status post total right knee replacement 12/24/2014  . Retained orthopedic hardware right tibia; previous tibial plateau fracture 03/08/2014  . S/P right knee arthroscopy 03/08/2014  . Infection of right tibia post ORIF tibial plateau 12/05/2013  . Tibial plateau fracture 11/17/2013    Montay Vanvoorhis, PT 03/11/2015, 5:07 PM  [facility] [address] [place], [place], [address] Phone: [phone]   Fax:  [phone]

## 2015-03-12 ENCOUNTER — Encounter: Payer: BLUE CROSS/BLUE SHIELD | Admitting: Physical Therapy

## 2015-03-13 ENCOUNTER — Ambulatory Visit: Payer: BLUE CROSS/BLUE SHIELD | Admitting: Physical Therapy

## 2015-03-13 ENCOUNTER — Encounter: Payer: Self-pay | Admitting: Physical Therapy

## 2015-03-13 DIAGNOSIS — M6281 Muscle weakness (generalized): Secondary | ICD-10-CM | POA: Diagnosis not present

## 2015-03-13 DIAGNOSIS — R609 Edema, unspecified: Secondary | ICD-10-CM

## 2015-03-13 DIAGNOSIS — R531 Weakness: Secondary | ICD-10-CM

## 2015-03-13 DIAGNOSIS — Z96651 Presence of right artificial knee joint: Secondary | ICD-10-CM

## 2015-03-13 NOTE — Therapy (Signed)
[facility] [address] [place], [place], [address] Phone: [phone]   Fax:  [phone]  Physical Therapy Treatment  Patient Details  Name: Roger Ayers MRN: [id number] Date of Birth: 1966-06-06 Referring Provider:  Marton Redwood, MD  Encounter Date: 03/13/2015      PT End of Session - 03/13/15 1640    Visit Number 17   Date for PT Re-Evaluation 04/09/15   Authorization - Number of Visits 30   PT Start Time 9233   PT Stop Time 1705   PT Time Calculation (min) 61 min   Activity Tolerance Patient tolerated treatment well   Behavior During Therapy [facility] for tasks assessed/performed      Past Medical History  Diagnosis Date  . Hypertension   . GERD (gastroesophageal reflux disease)   . Sleep apnea     cpap  . Arthritis   . MRSA infection     right leg    Past Surgical History  Procedure Laterality Date  . Mouth surgery  2014    bone grafts for implants  . Trigger finger release Right ~2013    middle finger  . External fixation leg Right 11/17/2013    Procedure: EXTERNAL FIXATION LEG;  Surgeon: Marianna Payment, MD;  Location: [facility];  Service: Orthopedics;  Laterality: Right;  . Orif tibia plateau Right 11/23/2013    Procedure: OPEN REDUCTION INTERNAL FIXATION (ORIF) RIGHT TIBIAL PLATEAU;  Surgeon: Mcarthur Rossetti, MD;  [facility]: [facility];  Service: Orthopedics;  Laterality: Right;  . External fixation removal Right 11/23/2013    Procedure: REMOVAL EXTERNAL FIXATION RIGHT LEG;  Surgeon: Mcarthur Rossetti, MD;  [facility];  Service: Orthopedics;  Laterality: Right;  . I&d extremity Right 12/05/2013    Procedure: IRRIGATION AND DEBRIDEMENT RIGHT LEG;  Surgeon: Mcarthur Rossetti, MD;  Location: [place];  Service: Orthopedics;  Laterality: Right;  . I&d extremity Right 12/08/2013    Procedure: Repeat IRRIGATION AND DEBRIDEMENT Right Leg with Stimulan bead placement;  Surgeon:  Mcarthur Rossetti, MD;  Location: [place];  Service: Orthopedics;  Laterality: Right;  . Knee arthroscopy Right 03/08/2014    Procedure:  arthroscopy RIGHT KNEE WITH EXTENSIVE DEBRIDEMENT;  Surgeon: Mcarthur Rossetti, MD;  [facility];  Service: Orthopedics;  Laterality: Right;  . Hardware removal Right 03/08/2014    Procedure: Removal plate and screws right tibia,;  Surgeon: Mcarthur Rossetti, MD;  [facility];  Service: Orthopedics;  Laterality: Right;  . Total knee arthroplasty Right 12/24/2014    dr Ninfa Linden  . Total knee arthroplasty Right 12/24/2014    Procedure: RIGHT TOTAL KNEE ARTHROPLASTY;  Surgeon: Mcarthur Rossetti, MD;  Location: [place];  Service: Orthopedics;  Laterality: Right;    There were no vitals filed for this visit.  Visit Diagnosis:  Decreased strength  Status post total right knee replacement  Edema                     [facility] Adult PT Treatment/Exercise - 03/13/15 0001    Knee/Hip Exercises: Aerobic   Stationary Bike L5 29min   Elliptical 67min ramp2,incline 5   Knee/Hip Exercises: Machines for Strengthening   Cybex Leg Press 130#x10, 125# 3x10 with B LE, 70# x10, 65# 2x10 Rt LE   Knee/Hip Exercises: Standing   Lateral Step Up 3 sets;10 reps  8 inch box, no UE support, but challenging   Forward Step Up 3 sets;10 reps   Step  Down 3 sets;10 reps  6inch box with 1 UE support   Other Standing Knee Exercises sidestepping in partial squatting 21feet Lt and Rt side   Modalities   Modalities Cryotherapy   Cryotherapy   Number Minutes Cryotherapy 15 Minutes   Cryotherapy Location Knee   Type of Cryotherapy Other (comment)  vasopneumatic device B LE elevated   Manual Therapy   Manual Therapy --   Passive ROM Rt knee into flexion                  PT Short Term Goals - 03/04/15 1656    PT SHORT TERM GOAL #1   Title be independent with initial HEP for contracting quadricep correctly   Time 4   Period Weeks    Status Achieved   PT SHORT TERM GOAL #2   Title Right knee extension AROM >/-4 degrees   Time 4   Period Weeks   Status Achieved   PT SHORT TERM GOAL #3   Title Right knee flexion PROM  >/= 100 degrees   Time 4   Period Weeks   Status Achieved           PT Long Term Goals - 03/11/15 1633    PT LONG TERM GOAL #1   Title demonstrate and /or verbalize techniques to reduce the risk of re-injury to include info on anti-inflammatory   Time 8   Period Weeks   Status Achieved   PT LONG TERM GOAL #2   Title be independent with HEP   Time 8   Period Weeks   Status New  still learning how to progress his exercises   PT LONG TERM GOAL #3   Title go up and down steps with step over step pattern   Time 8   Period Weeks   Status New  step to step pattern   PT LONG TERM GOAL #4   Title AROM Rt knee flexion 105, extension -4   Time 8   Period Weeks   Status Achieved   PT LONG TERM GOAL #5   Title ambulate to work from the parking lot with minimal gait deficitis   Time 8   Period Weeks   Status On-going  easier to walk in the morning   PT LONG TERM GOAL #6   Title right knee strength >/= 4+/5 so patient can return to daily activities   Time 8   Period Weeks   Status On-going               Plan - 03/13/15 1642    Clinical Impression Statement Patient with improved strength and stamina   Pt will benefit from skilled therapeutic intervention in order to improve on the following deficits Impaired flexibility;Decreased range of motion;Increased edema;Pain;Decreased strength;Decreased mobility   Rehab Potential Good   Clinical Impairments Affecting Rehab Potential None   PT Frequency 2x / week   PT Duration 8 weeks   PT Treatment/Interventions Therapeutic activities;Patient/family education;Therapeutic exercise;Passive range of motion;Manual techniques;Balance training;Gait training   PT Next Visit Plan continue to improve eccentric control   PT Home Exercise Plan  continue with established HEP   Consulted and Agree with Plan of Care Patient        Problem List Patient Active Problem List   Diagnosis Date Noted  . Traumatic arthritis of right knee 12/24/2014  . Status post total right knee replacement 12/24/2014  . Retained orthopedic hardware right tibia; previous tibial plateau fracture 03/08/2014  . S/P  right knee arthroscopy 03/08/2014  . Infection of right tibia post ORIF tibial plateau 12/05/2013  . Tibial plateau fracture 11/17/2013    NAUMANN-HOUEGNIFIO,Nedra Mcinnis PTA 03/13/2015, 5:15 PM  [facility] [address] [place], [place], [address] Phone: [phone]   Fax:  [phone]

## 2015-03-17 ENCOUNTER — Encounter: Payer: BLUE CROSS/BLUE SHIELD | Admitting: Physical Therapy

## 2015-03-18 ENCOUNTER — Encounter: Payer: BLUE CROSS/BLUE SHIELD | Admitting: Physical Therapy

## 2015-03-19 ENCOUNTER — Encounter: Payer: BLUE CROSS/BLUE SHIELD | Admitting: Physical Therapy

## 2015-03-20 ENCOUNTER — Ambulatory Visit: Payer: BLUE CROSS/BLUE SHIELD | Admitting: Physical Therapy

## 2015-03-20 ENCOUNTER — Encounter: Payer: Self-pay | Admitting: Physical Therapy

## 2015-03-20 DIAGNOSIS — Z96651 Presence of right artificial knee joint: Secondary | ICD-10-CM

## 2015-03-20 DIAGNOSIS — M6281 Muscle weakness (generalized): Secondary | ICD-10-CM | POA: Diagnosis not present

## 2015-03-20 DIAGNOSIS — R531 Weakness: Secondary | ICD-10-CM

## 2015-03-20 NOTE — Therapy (Signed)
[facility] [address] [place], [place], [address] Phone: [phone]   Fax:  [phone]  Physical Therapy Treatment  Patient Details  Name: Roger Ayers MRN: [id number] Date of Birth: 07-Oct-1966 Referring Provider:  Mcarthur Rossetti*  Encounter Date: 03/20/2015      PT End of Session - 03/20/15 1635    Visit Number 18   Date for PT Re-Evaluation 04/09/15   Authorization - Number of Visits 30   PT Start Time 9702   PT Stop Time 1700   PT Time Calculation (min) 45 min   Activity Tolerance Patient tolerated treatment well   Behavior During Therapy [facility] for tasks assessed/performed      Past Medical History  Diagnosis Date  . Hypertension   . GERD (gastroesophageal reflux disease)   . Sleep apnea     cpap  . Arthritis   . MRSA infection     right leg    Past Surgical History  Procedure Laterality Date  . Mouth surgery  2014    bone grafts for implants  . Trigger finger release Right ~2013    middle finger  . External fixation leg Right 11/17/2013    Procedure: EXTERNAL FIXATION LEG;  Surgeon: Marianna Payment, MD;  [facility];  Service: Orthopedics;  Laterality: Right;  . Orif tibia plateau Right 11/23/2013    Procedure: OPEN REDUCTION INTERNAL FIXATION (ORIF) RIGHT TIBIAL PLATEAU;  Surgeon: Mcarthur Rossetti, MD;  [facility];  Service: Orthopedics;  Laterality: Right;  . External fixation removal Right 11/23/2013    Procedure: REMOVAL EXTERNAL FIXATION RIGHT LEG;  Surgeon: Mcarthur Rossetti, MD;  [facility];  Service: Orthopedics;  Laterality: Right;  . I&d extremity Right 12/05/2013    Procedure: IRRIGATION AND DEBRIDEMENT RIGHT LEG;  Surgeon: Mcarthur Rossetti, MD;  Location: [place];  Service: Orthopedics;  Laterality: Right;  . I&d extremity Right 12/08/2013    Procedure: Repeat IRRIGATION AND DEBRIDEMENT Right Leg with Stimulan bead placement;  Surgeon:  Mcarthur Rossetti, MD;  Location: [place];  Service: Orthopedics;  Laterality: Right;  . Knee arthroscopy Right 03/08/2014    Procedure:  arthroscopy RIGHT KNEE WITH EXTENSIVE DEBRIDEMENT;  Surgeon: Mcarthur Rossetti, MD;  [facility];  Service: Orthopedics;  Laterality: Right;  . Hardware removal Right 03/08/2014    Procedure: Removal plate and screws right tibia,;  Surgeon: Mcarthur Rossetti, MD;  [facility];  Service: Orthopedics;  Laterality: Right;  . Total knee arthroplasty Right 12/24/2014    dr Ninfa Linden  . Total knee arthroplasty Right 12/24/2014    Procedure: RIGHT TOTAL KNEE ARTHROPLASTY;  Surgeon: Mcarthur Rossetti, MD;  Location: [place];  Service: Orthopedics;  Laterality: Right;    There were no vitals filed for this visit.  Visit Diagnosis:  Decreased strength  Status post total right knee replacement      Subjective Assessment - 03/20/15 1629    Symptoms I am having trouble getting my endurance back. Able to go up 10 steps without handrail then need the handrail for the 30 remiander steps.    Limitations Walking;Sitting;Standing;House hold activities   How long can you sit comfortably? 1 hour   How long can you stand comfortably? 40 min.   How long can you walk comfortably? 40 min.   Diagnostic tests none   Patient Stated Goals no pain, return to activities, and work fulltime, working part time since Monday March7th   Currently in  Pain? Yes   Pain Score 3   constant   Pain Location Knee   Pain Orientation Right   Pain Descriptors / Indicators Constant;Aching;Dull   Pain Type Chronic pain   Pain Onset More than a month ago   Pain Frequency Constant   Aggravating Factors  hitting toe on something   Pain Relieving Factors ice, medication, rest   Effect of Pain on Daily Activities activities slower   Multiple Pain Sites No            [facility] PT Assessment - 03/20/15 0001    PROM   PROM Assessment Site Knee   Right/Left Knee Right   ext. 0 and flexion 125   Strength   Strength Assessment Site Knee   Right/Left Knee Right                   [facility] Adult PT Treatment/Exercise - 03/20/15 0001    Knee/Hip Exercises: Aerobic   Stationary Bike L5 67min   Elliptical 19min ramp2,incline 5   Knee/Hip Exercises: Machines for Strengthening   Cybex Leg Press 135#x10,140#x10,145#x10 bil.; right 10x70#, 10x 75#, 10x80#   Knee/Hip Exercises: Standing   Lateral Step Up 3 sets;10 reps  8 inch box, no UE support, but challenging   Forward Step Up 3 sets;10 reps   Step Down 3 sets;10 reps  6inch box with 1 UE support   Other Standing Knee Exercises sidestepping in partial squatting 77feet Lt and Rt side   Manual Therapy   Passive ROM PROM of  right knee                PT Education - 03/20/15 1657    Education provided No          PT Short Term Goals - 03/04/15 1656    PT SHORT TERM GOAL #1   Title be independent with initial HEP for contracting quadricep correctly   Time 4   Period Weeks   Status Achieved   PT SHORT TERM GOAL #2   Title Right knee extension AROM >/-4 degrees   Time 4   Period Weeks   Status Achieved   PT SHORT TERM GOAL #3   Title Right knee flexion PROM  >/= 100 degrees   Time 4   Period Weeks   Status Achieved           PT Long Term Goals - 03/20/15 1636    PT LONG TERM GOAL #1   Title demonstrate and /or verbalize techniques to reduce the risk of re-injury to include info on anti-inflammatory   Time 8   Period Weeks   Status Achieved   PT LONG TERM GOAL #2   Title be independent with HEP   Time 8   Period Weeks   Status On-going  still learning HEP   PT LONG TERM GOAL #3   Title go up and down steps with step over step pattern   Time 8   Period Weeks   Status On-going  had to use a rail   PT LONG TERM GOAL #4   Title AROM Rt knee flexion 105, extension -4   Time 8   Period Weeks   Status Achieved   PT LONG TERM GOAL #5   Title ambulate to work from  the parking lot with minimal gait deficitis   Time 8   Period Weeks   Status On-going  no limping in morning, limp in evening   PT LONG TERM GOAL #  6   Title right knee strength >/= 4+/5 so patient can return to daily activities   Time 8   Period Weeks   Status On-going               Plan - 03/20/15 1658    Clinical Impression Statement Patient has increased time on the bike and elliptical due to increased stamina.  Patient has increased right knee PROM.    Pt will benefit from skilled therapeutic intervention in order to improve on the following deficits Impaired flexibility;Decreased range of motion;Increased edema;Pain;Decreased strength;Decreased mobility   Rehab Potential Good   Clinical Impairments Affecting Rehab Potential None   PT Frequency 2x / week   PT Duration 8 weeks   PT Treatment/Interventions Therapeutic activities;Patient/family education;Therapeutic exercise;Passive range of motion;Manual techniques;Balance training;Gait training   PT Next Visit Plan work on right knee strength   PT Home Exercise Plan continue with established HEP   Consulted and Agree with Plan of Care Patient        Problem List Patient Active Problem List   Diagnosis Date Noted  . Traumatic arthritis of right knee 12/24/2014  . Status post total right knee replacement 12/24/2014  . Retained orthopedic hardware right tibia; previous tibial plateau fracture 03/08/2014  . S/P right knee arthroscopy 03/08/2014  . Infection of right tibia post ORIF tibial plateau 12/05/2013  . Tibial plateau fracture 11/17/2013    GRAY,CHERYL,PT 03/20/2015, 5:03 PM  [facility] [address], [place], [place], [address] Phone: [phone]   Fax:  [phone]

## 2015-03-24 ENCOUNTER — Encounter: Payer: BLUE CROSS/BLUE SHIELD | Admitting: Physical Therapy

## 2015-03-25 ENCOUNTER — Encounter: Payer: Self-pay | Admitting: Physical Therapy

## 2015-03-25 ENCOUNTER — Ambulatory Visit: Payer: BLUE CROSS/BLUE SHIELD | Admitting: Physical Therapy

## 2015-03-25 DIAGNOSIS — M6281 Muscle weakness (generalized): Secondary | ICD-10-CM | POA: Diagnosis not present

## 2015-03-25 DIAGNOSIS — R609 Edema, unspecified: Secondary | ICD-10-CM

## 2015-03-25 DIAGNOSIS — Z96651 Presence of right artificial knee joint: Secondary | ICD-10-CM

## 2015-03-25 DIAGNOSIS — R531 Weakness: Secondary | ICD-10-CM

## 2015-03-25 NOTE — Therapy (Signed)
[facility] [address] [place], [place], [address] Phone: [phone]   Fax:  [phone]  Physical Therapy Treatment  Patient Details  Name: Roger Ayers MRN: [id number] Date of Birth: 06/27/1966 Referring Provider:  Blackman, Christopher Y*  Encounter Date: 03/25/2015      PT End of Session - 03/25/15 1649    Visit Number 19   Date for PT Re-Evaluation 04/09/15   Authorization - Visit Number 14   Authorization - Number of Visits 30   PT Start Time 1610   PT Stop Time 1715   PT Time Calculation (min) 65 min   Activity Tolerance Patient tolerated treatment well      Past Medical History  Diagnosis Date  . Hypertension   . GERD (gastroesophageal reflux disease)   . Sleep apnea     cpap  . Arthritis   . MRSA infection     right leg    Past Surgical History  Procedure Laterality Date  . Mouth surgery  2014    bone grafts for implants  . Trigger finger release Right ~2013    middle finger  . External fixation leg Right 11/17/2013    Procedure: EXTERNAL FIXATION LEG;  Surgeon: Naiping Michael Xu, MD;  [facility];  Service: Orthopedics;  Laterality: Right;  . Orif tibia plateau Right 11/23/2013    Procedure: OPEN REDUCTION INTERNAL FIXATION (ORIF) RIGHT TIBIAL PLATEAU;  Surgeon: Christopher Y Blackman, MD;  [facility];  Service: Orthopedics;  Laterality: Right;  . External fixation removal Right 11/23/2013    Procedure: REMOVAL EXTERNAL FIXATION RIGHT LEG;  Surgeon: Christopher Y Blackman, MD;  [facility];  Service: Orthopedics;  Laterality: Right;  . I&d extremity Right 12/05/2013    Procedure: IRRIGATION AND DEBRIDEMENT RIGHT LEG;  Surgeon: Christopher Y Blackman, MD;  [facility];  Service: Orthopedics;  Laterality: Right;  . I&d extremity Right 12/08/2013    Procedure: Repeat IRRIGATION AND DEBRIDEMENT Right Leg with Stimulan bead placement;  Surgeon: Christopher Y Blackman,  MD;  [facility];  Service: Orthopedics;  Laterality: Right;  . Knee arthroscopy Right 03/08/2014    Procedure:  arthroscopy RIGHT KNEE WITH EXTENSIVE DEBRIDEMENT;  Surgeon: Christopher Y Blackman, MD;  [facility];  Service: Orthopedics;  Laterality: Right;  . Hardware removal Right 03/08/2014    Procedure: Removal plate and screws right tibia,;  Surgeon: Christopher Y Blackman, MD;  [facility];  Service: Orthopedics;  Laterality: Right;  . Total knee arthroplasty Right 12/24/2014    dr blackman  . Total knee arthroplasty Right 12/24/2014    Procedure: RIGHT TOTAL KNEE ARTHROPLASTY;  Surgeon: Christopher Y Blackman, MD;  [facility];  Service: Orthopedics;  Laterality: Right;    There were no vitals filed for this visit.  Visit Diagnosis:  Decreased strength  Status post total right knee replacement  Edema      Subjective Assessment - 03/25/15 1625    Symptoms Patient with limbing gait at around 4:30 at work, due to decreased endurance and increase swellling in Rt knee at end of day.    Limitations Walking;Sitting;Standing;House hold activities   How long can you sit comfortably? 1 hour   How long can you stand comfortably? up to 60   How long can you walk comfortably? 45-50min   Diagnostic tests none   Patient Stated Goals no pain, return to activities, and work fulltime, working full time (21 of March) without limbing gait at the end   of day   Currently in Pain? Yes   Pain Score 4    Pain Location Knee   Pain Orientation Right   Pain Descriptors / Indicators Aching;Constant;Dull   Pain Type Chronic pain   Pain Onset More than a month ago   Aggravating Factors  long days with activities   Effect of Pain on Daily Activities slower down activities, limbing at times buckeling of Rt knee   Multiple Pain Sites No            [facility] PT Assessment - 03/25/15 0001    AROM   Right Knee Flexion 125  in sitting                   [facility] Adult PT  Treatment/Exercise - 03/25/15 0001    Knee/Hip Exercises: Aerobic   Stationary Bike L5 10min   Knee/Hip Exercises: Machines for Strengthening   Cybex Leg Press 150#x10,155#x10,160#x20 bil: Rt 80# x10, 75#x10   Knee/Hip Exercises: Standing   Lateral Step Up 3 sets;10 reps  8 inch box, no UE support, but challenging   Forward Step Up 3 sets;10 reps   Step Down 3 sets;10 reps  8inch box with 1 UE support   Modalities   Modalities Cryotherapy   Cryotherapy   Number Minutes Cryotherapy 15 Minutes   Cryotherapy Location Knee   Type of Cryotherapy Other (comment)   Manual Therapy   Manual Therapy Joint mobilization  Patella mob caudal lat/med glide and crossfriction                  PT Short Term Goals - 03/04/15 1656    PT SHORT TERM GOAL #1   Title be independent with initial HEP for contracting quadricep correctly   Time 4   Period Weeks   Status Achieved   PT SHORT TERM GOAL #2   Title Right knee extension AROM >/-4 degrees   Time 4   Period Weeks   Status Achieved   PT SHORT TERM GOAL #3   Title Right knee flexion PROM  >/= 100 degrees   Time 4   Period Weeks   Status Achieved           PT Long Term Goals - 03/25/15 1652    PT LONG TERM GOAL #1   Title demonstrate and /or verbalize techniques to reduce the risk of re-injury to include info on anti-inflammatory   Time 8   Period Weeks   Status Achieved   PT LONG TERM GOAL #2   Title be independent with HEP   Time 8   Period Weeks   Status On-going   PT LONG TERM GOAL #3   Title go up and down steps with step over step pattern   Period Weeks   Status Partially Met  80% of the time   PT LONG TERM GOAL #4   Title AROM Rt knee flexion 105, extension -4   Status Achieved  As of 03/25/15 flexion 125degrees   PT LONG TERM GOAL #5   Title ambulate to work from the parking lot with minimal gait deficitis   Time 8   Period Weeks   Status Partially Met  50% improvement reported   PT LONG TERM GOAL #6    Title right knee strength >/= 4+/5 so patient can return to daily activities   Time 8   Period Weeks   Status On-going                 Plan - 03/25/15 1650    Clinical Impression Statement Patient able to negotiate 8" box and demonstrates improved quadriceps control   Pt will benefit from skilled therapeutic intervention in order to improve on the following deficits Impaired flexibility;Decreased range of motion;Increased edema;Pain;Decreased strength;Decreased mobility   Rehab Potential Good   Clinical Impairments Affecting Rehab Potential None   PT Frequency 2x / week   PT Duration 8 weeks   PT Treatment/Interventions Therapeutic activities;Patient/family education;Therapeutic exercise;Passive range of motion;Manual techniques;Balance training;Gait training   PT Next Visit Plan work on right knee strength   PT Home Exercise Plan continue with established HEP   Recommended Other Services none   Consulted and Agree with Plan of Care Patient        Problem List Patient Active Problem List   Diagnosis Date Noted  . Traumatic arthritis of right knee 12/24/2014  . Status post total right knee replacement 12/24/2014  . Retained orthopedic hardware right tibia; previous tibial plateau fracture 03/08/2014  . S/P right knee arthroscopy 03/08/2014  . Infection of right tibia post ORIF tibial plateau 12/05/2013  . Tibial plateau fracture 11/17/2013    Ayers,Roger Hansley PTA 03/25/2015, 5:06 PM  [facility] [address], [place], [place], [address] Phone: [phone]   Fax:  [phone]

## 2015-03-26 ENCOUNTER — Encounter: Payer: BLUE CROSS/BLUE SHIELD | Admitting: Physical Therapy

## 2015-03-27 ENCOUNTER — Ambulatory Visit: Payer: BLUE CROSS/BLUE SHIELD | Admitting: Physical Therapy

## 2015-03-27 ENCOUNTER — Encounter: Payer: Self-pay | Admitting: Physical Therapy

## 2015-03-27 DIAGNOSIS — M6281 Muscle weakness (generalized): Secondary | ICD-10-CM | POA: Diagnosis not present

## 2015-03-27 DIAGNOSIS — R531 Weakness: Secondary | ICD-10-CM

## 2015-03-27 DIAGNOSIS — Z96651 Presence of right artificial knee joint: Secondary | ICD-10-CM

## 2015-03-27 DIAGNOSIS — R609 Edema, unspecified: Secondary | ICD-10-CM

## 2015-03-27 NOTE — Therapy (Signed)
[facility] [address], [place], [place], [address] Phone: [phone]   Fax:  [phone]  Physical Therapy Treatment  Patient Details  Name: Roger Ayers MRN: [id number] Date of Birth: Apr 21, 1966 Referring Provider:  Mcarthur Rossetti*  Encounter Date: 03/27/2015      PT End of Session - 03/27/15 1653    Visit Number 20   Date for PT Re-Evaluation 04/09/15   Authorization - Visit Number 14   Authorization - Number of Visits 30   PT Start Time 2446   PT Stop Time 1712   PT Time Calculation (min) 63 min   Activity Tolerance Patient tolerated treatment well   Behavior During Therapy [facility] for tasks assessed/performed      Past Medical History  Diagnosis Date  . Hypertension   . GERD (gastroesophageal reflux disease)   . Sleep apnea     cpap  . Arthritis   . MRSA infection     right leg    Past Surgical History  Procedure Laterality Date  . Mouth surgery  2014    bone grafts for implants  . Trigger finger release Right ~2013    middle finger  . External fixation leg Right 11/17/2013    Procedure: EXTERNAL FIXATION LEG;  Surgeon: Marianna Payment, MD;  [facility]: [facility];  Service: Orthopedics;  Laterality: Right;  . Orif tibia plateau Right 11/23/2013    Procedure: OPEN REDUCTION INTERNAL FIXATION (ORIF) RIGHT TIBIAL PLATEAU;  Surgeon: Mcarthur Rossetti, MD;  [facility];  Service: Orthopedics;  Laterality: Right;  . External fixation removal Right 11/23/2013    Procedure: REMOVAL EXTERNAL FIXATION RIGHT LEG;  Surgeon: Mcarthur Rossetti, MD;  [facility];  Service: Orthopedics;  Laterality: Right;  . I&d extremity Right 12/05/2013    Procedure: IRRIGATION AND DEBRIDEMENT RIGHT LEG;  Surgeon: Mcarthur Rossetti, MD;  Location: [place];  Service: Orthopedics;  Laterality: Right;  . I&d extremity Right 12/08/2013    Procedure: Repeat IRRIGATION AND DEBRIDEMENT Right Leg with  Stimulan bead placement;  Surgeon: Mcarthur Rossetti, MD;  Location: [place];  Service: Orthopedics;  Laterality: Right;  . Knee arthroscopy Right 03/08/2014    Procedure:  arthroscopy RIGHT KNEE WITH EXTENSIVE DEBRIDEMENT;  Surgeon: Mcarthur Rossetti, MD;  [facility];  Service: Orthopedics;  Laterality: Right;  . Hardware removal Right 03/08/2014    Procedure: Removal plate and screws right tibia,;  Surgeon: Mcarthur Rossetti, MD;  [facility];  Service: Orthopedics;  Laterality: Right;  . Total knee arthroplasty Right 12/24/2014    dr Ninfa Linden  . Total knee arthroplasty Right 12/24/2014    Procedure: RIGHT TOTAL KNEE ARTHROPLASTY;  Surgeon: Mcarthur Rossetti, MD;  Location: [place];  Service: Orthopedics;  Laterality: Right;    There were no vitals filed for this visit.  Visit Diagnosis:  Decreased strength  Status post total right knee replacement  Edema                     [facility] Adult PT Treatment/Exercise - 03/27/15 0001    Knee/Hip Exercises: Aerobic   Stationary Bike L6 28mn   Elliptical --  pt wishes to skip ellictical today due to slightly congested   Knee/Hip Exercises: Machines for Strengthening   Cybex Leg Press 170#x10,175#x10,180#10 bil: Rt 80# 2x10, 85#x10   Knee/Hip Exercises: Standing   Lateral Step Up 3 sets;10 reps  8 inch box, no UE support, but challenging  Forward Step Up 3 sets;10 reps   Step Down 3 sets;10 reps  8inch box with 1 UE support   Other Standing Knee Exercises sidestepping in partial squatting 75fet Lt and Rt side   Other Standing Knee Exercises BOSU step up x 10 with 1 UE support   Knee/Hip Exercises: Seated   Other Seated Knee Exercises 45# B LE 2 x10, Rt LE 30# 2 x 10    Modalities   Modalities Cryotherapy   Cryotherapy   Number Minutes Cryotherapy 15 Minutes   Cryotherapy Location Knee   Type of Cryotherapy Other (comment)  Game Ready   Manual Therapy   Manual Therapy Joint mobilization   Patella mob caudal lat/med glide and crossfriction                  PT Short Term Goals - 03/04/15 1656    PT SHORT TERM GOAL #1   Title be independent with initial HEP for contracting quadricep correctly   Time 4   Period Weeks   Status Achieved   PT SHORT TERM GOAL #2   Title Right knee extension AROM >/-4 degrees   Time 4   Period Weeks   Status Achieved   PT SHORT TERM GOAL #3   Title Right knee flexion PROM  >/= 100 degrees   Time 4   Period Weeks   Status Achieved           PT Long Term Goals - 03/25/15 1652    PT LONG TERM GOAL #1   Title demonstrate and /or verbalize techniques to reduce the risk of re-injury to include info on anti-inflammatory   Time 8   Period Weeks   Status Achieved   PT LONG TERM GOAL #2   Title be independent with HEP   Time 8   Period Weeks   Status On-going   PT LONG TERM GOAL #3   Title go up and down steps with step over step pattern   Period Weeks   Status Partially Met  80% of the time   PT LONG TERM GOAL #4   Title AROM Rt knee flexion 105, extension -4   Status Achieved  As of 03/25/15 flexion 125degrees   PT LONG TERM GOAL #5   Title ambulate to work from the parking lot with minimal gait deficitis   Time 8   Period Weeks   Status Partially Met  50% improvement reported   PT LONG TERM GOAL #6   Title right knee strength >/= 4+/5 so patient can return to daily activities   Time 8   Period Weeks   Status On-going               Plan - 03/27/15 1657    Clinical Impression Statement Pt presents able to tolerate increased weight on leg press improving with strength    Pt will benefit from skilled therapeutic intervention in order to improve on the following deficits Impaired flexibility;Decreased range of motion;Increased edema;Pain;Decreased strength;Decreased mobility   Rehab Potential Good   Clinical Impairments Affecting Rehab Potential None   PT Frequency 2x / week   PT Duration 8 weeks   PT  Treatment/Interventions Therapeutic activities;Patient/family education;Therapeutic exercise;Passive range of motion;Manual techniques;Balance training;Gait training   PT Next Visit Plan work on right knee strength   PT Home Exercise Plan continue with established HEP   Consulted and Agree with Plan of Care Patient        Problem List Patient Active Problem  List   Diagnosis Date Noted  . Traumatic arthritis of right knee 12/24/2014  . Status post total right knee replacement 12/24/2014  . Retained orthopedic hardware right tibia; previous tibial plateau fracture 03/08/2014  . S/P right knee arthroscopy 03/08/2014  . Infection of right tibia post ORIF tibial plateau 12/05/2013  . Tibial plateau fracture 11/17/2013    NAUMANN-HOUEGNIFIO,Imagine Nest PTA 03/27/2015, 5:01 PM  [facility] [address], [place], [place], [address] Phone: [phone]   Fax:  [phone]

## 2015-04-01 ENCOUNTER — Ambulatory Visit: Payer: BLUE CROSS/BLUE SHIELD | Admitting: Physical Therapy

## 2015-04-07 ENCOUNTER — Ambulatory Visit: Payer: BLUE CROSS/BLUE SHIELD | Attending: Orthopaedic Surgery | Admitting: Physical Therapy

## 2015-04-07 ENCOUNTER — Encounter: Payer: Self-pay | Admitting: Physical Therapy

## 2015-04-07 DIAGNOSIS — Z96651 Presence of right artificial knee joint: Secondary | ICD-10-CM | POA: Insufficient documentation

## 2015-04-07 DIAGNOSIS — R609 Edema, unspecified: Secondary | ICD-10-CM | POA: Insufficient documentation

## 2015-04-07 DIAGNOSIS — R531 Weakness: Secondary | ICD-10-CM

## 2015-04-07 DIAGNOSIS — M6281 Muscle weakness (generalized): Secondary | ICD-10-CM | POA: Insufficient documentation

## 2015-04-07 NOTE — Therapy (Signed)
[facility] [address] [place], [place], [address] Phone: [phone]   Fax:  [phone]  Physical Therapy Treatment  Patient Details  Name: Roger Ayers MRN: [id number] Date of Birth: 03-28-1966 Referring Provider:  Mcarthur Rossetti*  Encounter Date: 04/07/2015      PT End of Session - 04/07/15 1633    Visit Number 21   Date for PT Re-Evaluation 04/09/15   PT Start Time 1615   PT Stop Time 1700   PT Time Calculation (min) 45 min   Activity Tolerance Patient tolerated treatment well   Behavior During Therapy [facility] for tasks assessed/performed      Past Medical History  Diagnosis Date  . Hypertension   . GERD (gastroesophageal reflux disease)   . Sleep apnea     cpap  . Arthritis   . MRSA infection     right leg    Past Surgical History  Procedure Laterality Date  . Mouth surgery  2014    bone grafts for implants  . Trigger finger release Right ~2013    middle finger  . External fixation leg Right 11/17/2013    Procedure: EXTERNAL FIXATION LEG;  Surgeon: Marianna Payment, MD;  [facility];  Service: Orthopedics;  Laterality: Right;  . Orif tibia plateau Right 11/23/2013    Procedure: OPEN REDUCTION INTERNAL FIXATION (ORIF) RIGHT TIBIAL PLATEAU;  Surgeon: Mcarthur Rossetti, MD;  [facility];  Service: Orthopedics;  Laterality: Right;  . External fixation removal Right 11/23/2013    Procedure: REMOVAL EXTERNAL FIXATION RIGHT LEG;  Surgeon: Mcarthur Rossetti, MD;  [facility];  Service: Orthopedics;  Laterality: Right;  . I&d extremity Right 12/05/2013    Procedure: IRRIGATION AND DEBRIDEMENT RIGHT LEG;  Surgeon: Mcarthur Rossetti, MD;  Location: [place];  Service: Orthopedics;  Laterality: Right;  . I&d extremity Right 12/08/2013    Procedure: Repeat IRRIGATION AND DEBRIDEMENT Right Leg with Stimulan bead placement;  Surgeon: Mcarthur Rossetti, MD;  Location:  [place];  Service: Orthopedics;  Laterality: Right;  . Knee arthroscopy Right 03/08/2014    Procedure:  arthroscopy RIGHT KNEE WITH EXTENSIVE DEBRIDEMENT;  Surgeon: Mcarthur Rossetti, MD;  [facility];  Service: Orthopedics;  Laterality: Right;  . Hardware removal Right 03/08/2014    Procedure: Removal plate and screws right tibia,;  Surgeon: Mcarthur Rossetti, MD;  [facility];  Service: Orthopedics;  Laterality: Right;  . Total knee arthroplasty Right 12/24/2014    dr Ninfa Linden  . Total knee arthroplasty Right 12/24/2014    Procedure: RIGHT TOTAL KNEE ARTHROPLASTY;  Surgeon: Mcarthur Rossetti, MD;  Location: [place];  Service: Orthopedics;  Laterality: Right;    There were no vitals filed for this visit.  Visit Diagnosis:  Decreased strength      Subjective Assessment - 04/07/15 1627    Subjective i am doing well. I am exercising at home.    How long can you sit comfortably? 2 hours   How long can you stand comfortably? 1 1/2 hours   How long can you walk comfortably? 1/2 mile   Diagnostic tests none   Currently in Pain? Yes   Pain Score 5    Pain Location Knee   Pain Orientation Right   Pain Descriptors / Indicators Aching;Constant;Dull   Pain Type Chronic pain   Pain Onset More than a month ago   Pain Frequency Constant   Multiple Pain Sites No  [facility] PT Assessment - 04/07/15 0001    Assessment   Medical Diagnosis Right TKA   Observation/Other Assessments   Focus on Therapeutic Outcomes (FOTO)  435 limitation   AROM   Right Knee Extension 0   Right Knee Flexion 125   PROM   PROM Assessment Site Knee   Right/Left Knee Right  0-128   Strength   Right Knee Flexion 5/5   Right Knee Extension 5/5   Ambulation/Gait   Stair Management Technique No rails;Alternating pattern                   [facility] Adult PT Treatment/Exercise - 04/07/15 0001    Knee/Hip Exercises: Aerobic   Stationary Bike L6 41mn   Knee/Hip Exercises:  Machines for Strengthening   Cybex Leg Press  185 #x10;170#x10,175#x10,180#10 bil: Rt 80# 2x10, 85#x10   Knee/Hip Exercises: Standing   Lateral Step Up 3 sets;10 reps  8 inch box, no UE support, but challenging   Forward Step Up 3 sets;10 reps   Step Down 3 sets;10 reps  8inch box with 1 UE support   Other Standing Knee Exercises BOSU step up x 10 with 1 UE support   Knee/Hip Exercises: Seated   Other Seated Knee Exercises 45# B LE 2 x10, Rt LE 25# 2 x 10                 PT Education - 04/07/15 1642    Education provided Yes   Education Details reviewed patient exercise program he is performing at home verbally. Patient understands correctly on how to perform.    Person(s) Educated Patient   Methods Explanation;Verbal cues   Comprehension Verbalized understanding          PT Short Term Goals - 03/04/15 1656    PT SHORT TERM GOAL #1   Title be independent with initial HEP for contracting quadricep correctly   Time 4   Period Weeks   Status Achieved   PT SHORT TERM GOAL #2   Title Right knee extension AROM >/-4 degrees   Time 4   Period Weeks   Status Achieved   PT SHORT TERM GOAL #3   Title Right knee flexion PROM  >/= 100 degrees   Time 4   Period Weeks   Status Achieved           PT Long Term Goals - 04/07/15 1631    PT LONG TERM GOAL #2   Title be independent with HEP   Time 8   Period Weeks   Status Achieved   PT LONG TERM GOAL #3   Title go up and down steps with step over step pattern   Period Weeks   Status Achieved   PT LONG TERM GOAL #5   Title ambulate to work from the parking lot with minimal gait deficitis   Time 8   Period Days   Status Achieved   PT LONG TERM GOAL #6   Title right knee strength >/= 4+/5 so patient can return to daily activities   Time 8   Period Weeks   Status Achieved               Plan - 04/07/15 1632    Clinical Impression Statement Patient has met all of his goals.  Patient is ready to exerices at  home.  Patient has increased ROM and strength in right knee.    Pt will benefit from skilled therapeutic intervention in order to improve on  the following deficits Impaired flexibility;Decreased range of motion;Increased edema;Pain;Decreased strength;Decreased mobility   Rehab Potential Good   Clinical Impairments Affecting Rehab Potential None   PT Frequency 2x / week   PT Duration 8 weeks   PT Treatment/Interventions Therapeutic activities;Patient/family education;Therapeutic exercise;Passive range of motion;Manual techniques;Balance training;Gait training   PT Next Visit Plan D/C patient    PT Home Exercise Plan continue with current HEP   Recommended Other Services None   Consulted and Agree with Plan of Care Patient        Problem List Patient Active Problem List   Diagnosis Date Noted  . Traumatic arthritis of right knee 12/24/2014  . Status post total right knee replacement 12/24/2014  . Retained orthopedic hardware right tibia; previous tibial plateau fracture 03/08/2014  . S/P right knee arthroscopy 03/08/2014  . Infection of right tibia post ORIF tibial plateau 12/05/2013  . Tibial plateau fracture 11/17/2013    GRAY,CHERYL,PT 04/07/2015, 4:53 PM  [facility] [address] [place], [place], [address] Phone: [phone]   Fax:  [phone]  PHYSICAL THERAPY DISCHARGE SUMMARY  Visits from Start of Care: 21 Current functional level related to goals / functional outcomes: Patient has met all of his goals as above. Patient has increased range of motion and strength of right knee.  Patient has not started golf but will be practicing now since he is discharged.    Remaining deficits: none   Education / Equipment: HEP Plan: Patient agrees to discharge.  Patient goals were met. Patient is being discharged due to meeting the stated rehab goals. Thank you for the referral.  ?????  Earlie Counts, PT 04/07/2015  4:53 PM

## 2015-04-10 ENCOUNTER — Encounter: Payer: BLUE CROSS/BLUE SHIELD | Admitting: Physical Therapy

## 2015-04-17 ENCOUNTER — Encounter: Payer: BLUE CROSS/BLUE SHIELD | Admitting: Physical Therapy

## 2015-07-23 IMAGING — CT CT KNEE*R* W/O CM
2 of 4 series · 4 of 14 positions shown, 5 images · non-contrast
Comparison: Radiographic series dated 11/17/2013

CLINICAL DATA: Right knee tibial plateau fracture

EXAM:
CT OF THE RIGHT KNEE WITHOUT CONTRAST
TECHNIQUE: Multidetector CT imaging was performed according to the standard
protocol. Multiplanar CT image reconstructions were also generated.

[Series 6: knee st · axial · 0.39mm/px · z∈[-716,-600]mm · 2 of 175 slices shown, 3 images]
[im 59/175  soft-tissue]
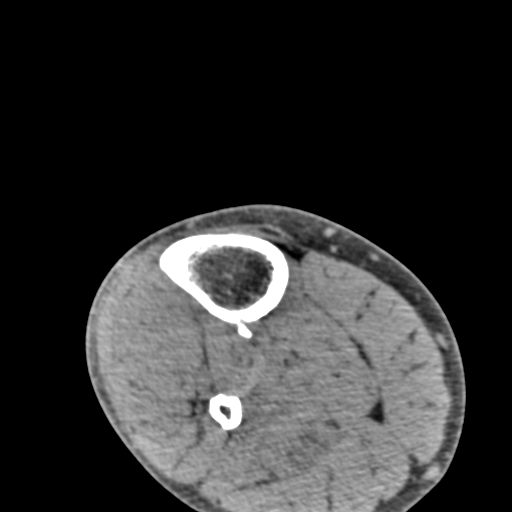
[im 59/175  bone]
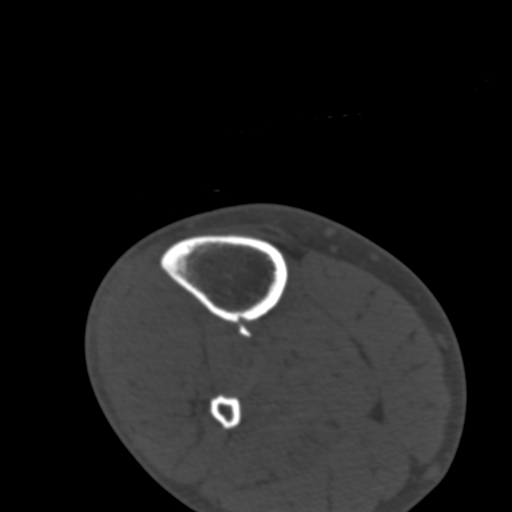
[im 117/175  bone]
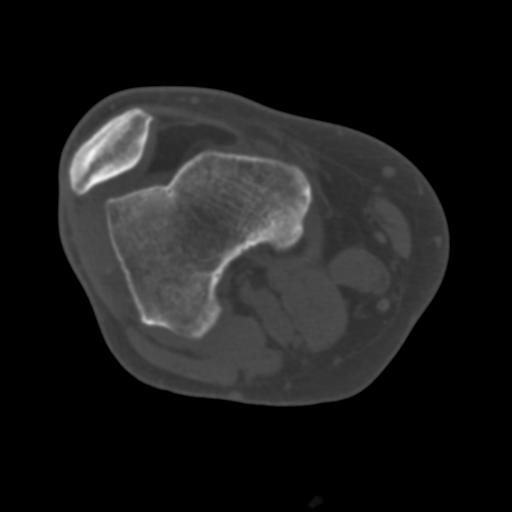

[Series 604: axial soft tissue · axial · 0.68mm/px · z∈[-757,-656]mm · 2 of 159 slices shown]
[im 53/159  soft-tissue]
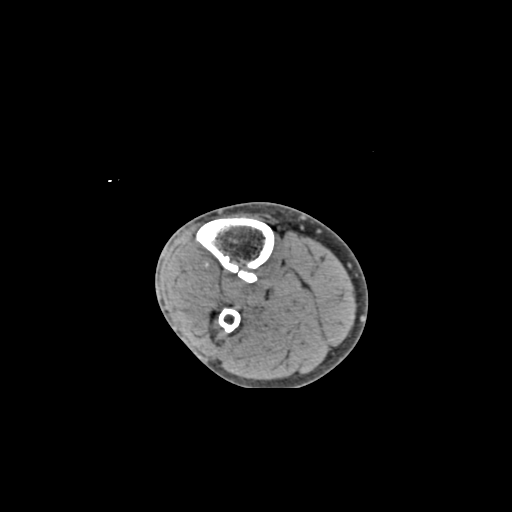
[im 106/159  soft-tissue]
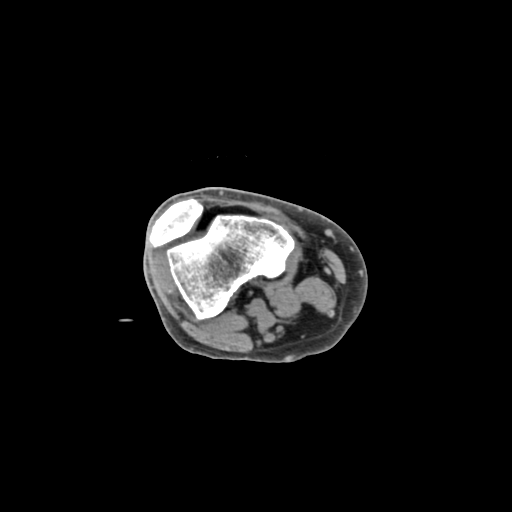

[4 of 14 positions shown; findings below may reference images not displayed]

FINDINGS: A comminuted fracture is identified involving the lateral tibial
plateau. Areas of distraction appreciated maximal diameter of
cm. There are areas of depression in the posterior aspect of
fracture remaining tooth of approximately 1 cm. The fracture extends
into the tibial spine region as well as into the medial tibial
plateau region. The medial tibial plateau fracture is also
comminuted and demonstrates minimal depression of 2-3 mm. There is a
vertical component extending into the proximal tibial shaft. An
impacted comminuted fibular head fracture is appreciated. There does
not appear to be significant displacement.
IMPRESSION: 1. Lateral and medial tibial plateau fractures with distraction and
areas of depression.
2. Comminuted fibular head fracture.

## 2015-07-23 IMAGING — RF DG FEMUR 2+V*R*
1 series · 1 of 1 positions shown · non-contrast
Comparison: 11/17/2013

CLINICAL DATA: External fixator placement

EXAM:
RIGHT FEMUR - 2 VIEW

[Series 1: run · 1 of 1 slices shown]
[im 1/1]
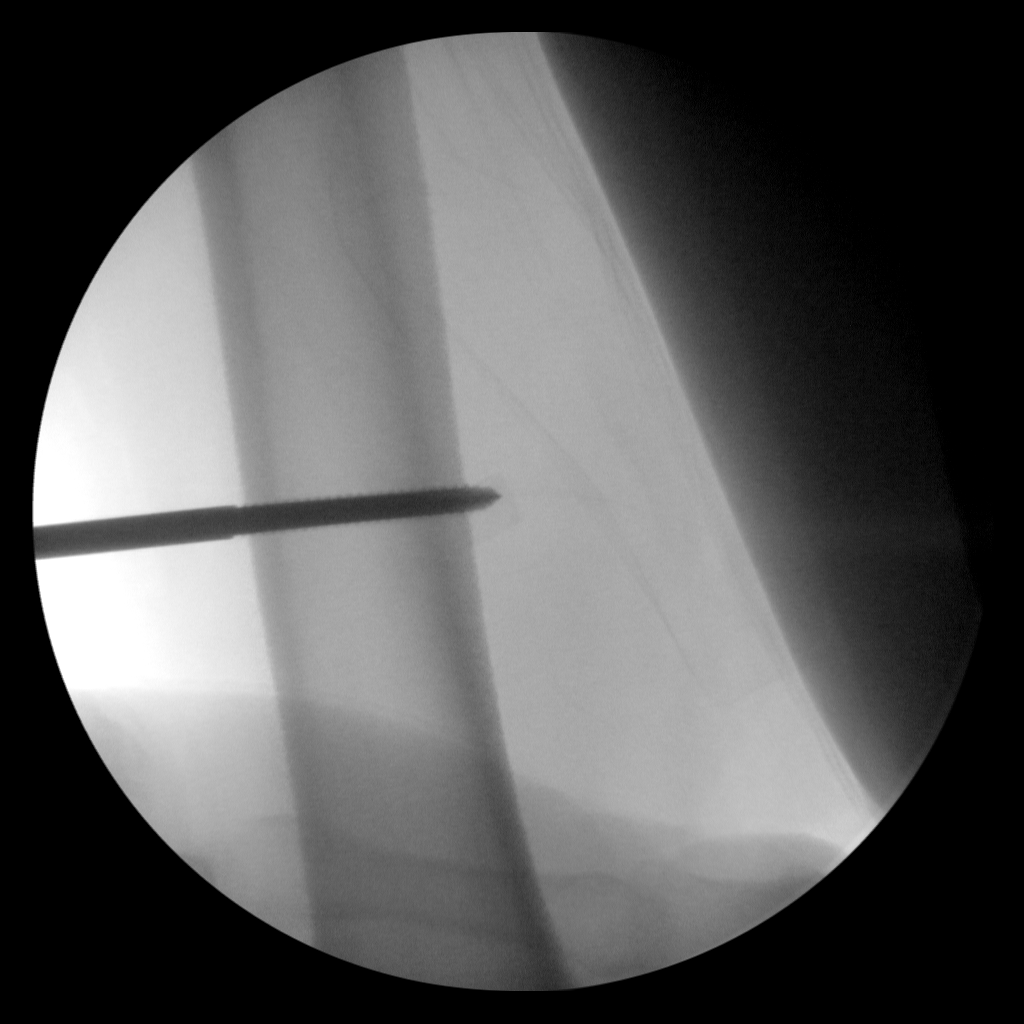

[1 of 1 positions shown; findings below may reference images not displayed]

FINDINGS: Multiple intraoperative spot images are submitted demonstrating
placement of external fixator device across the right knee and
tibial plateau fractures. No hardware or bony complicating feature.
IMPRESSION: External fixator placement across the right knee.

## 2015-08-10 IMAGING — CR DG CHEST 2V
2 series · 2 of 2 positions shown · non-contrast
Comparison: 11/17/2013

CLINICAL DATA: Preoperative exam prior to lower extremity incision
and drainage. Fever, smoker. Hypertension history.

EXAM:
CHEST  2 VIEW

[w chest lat]
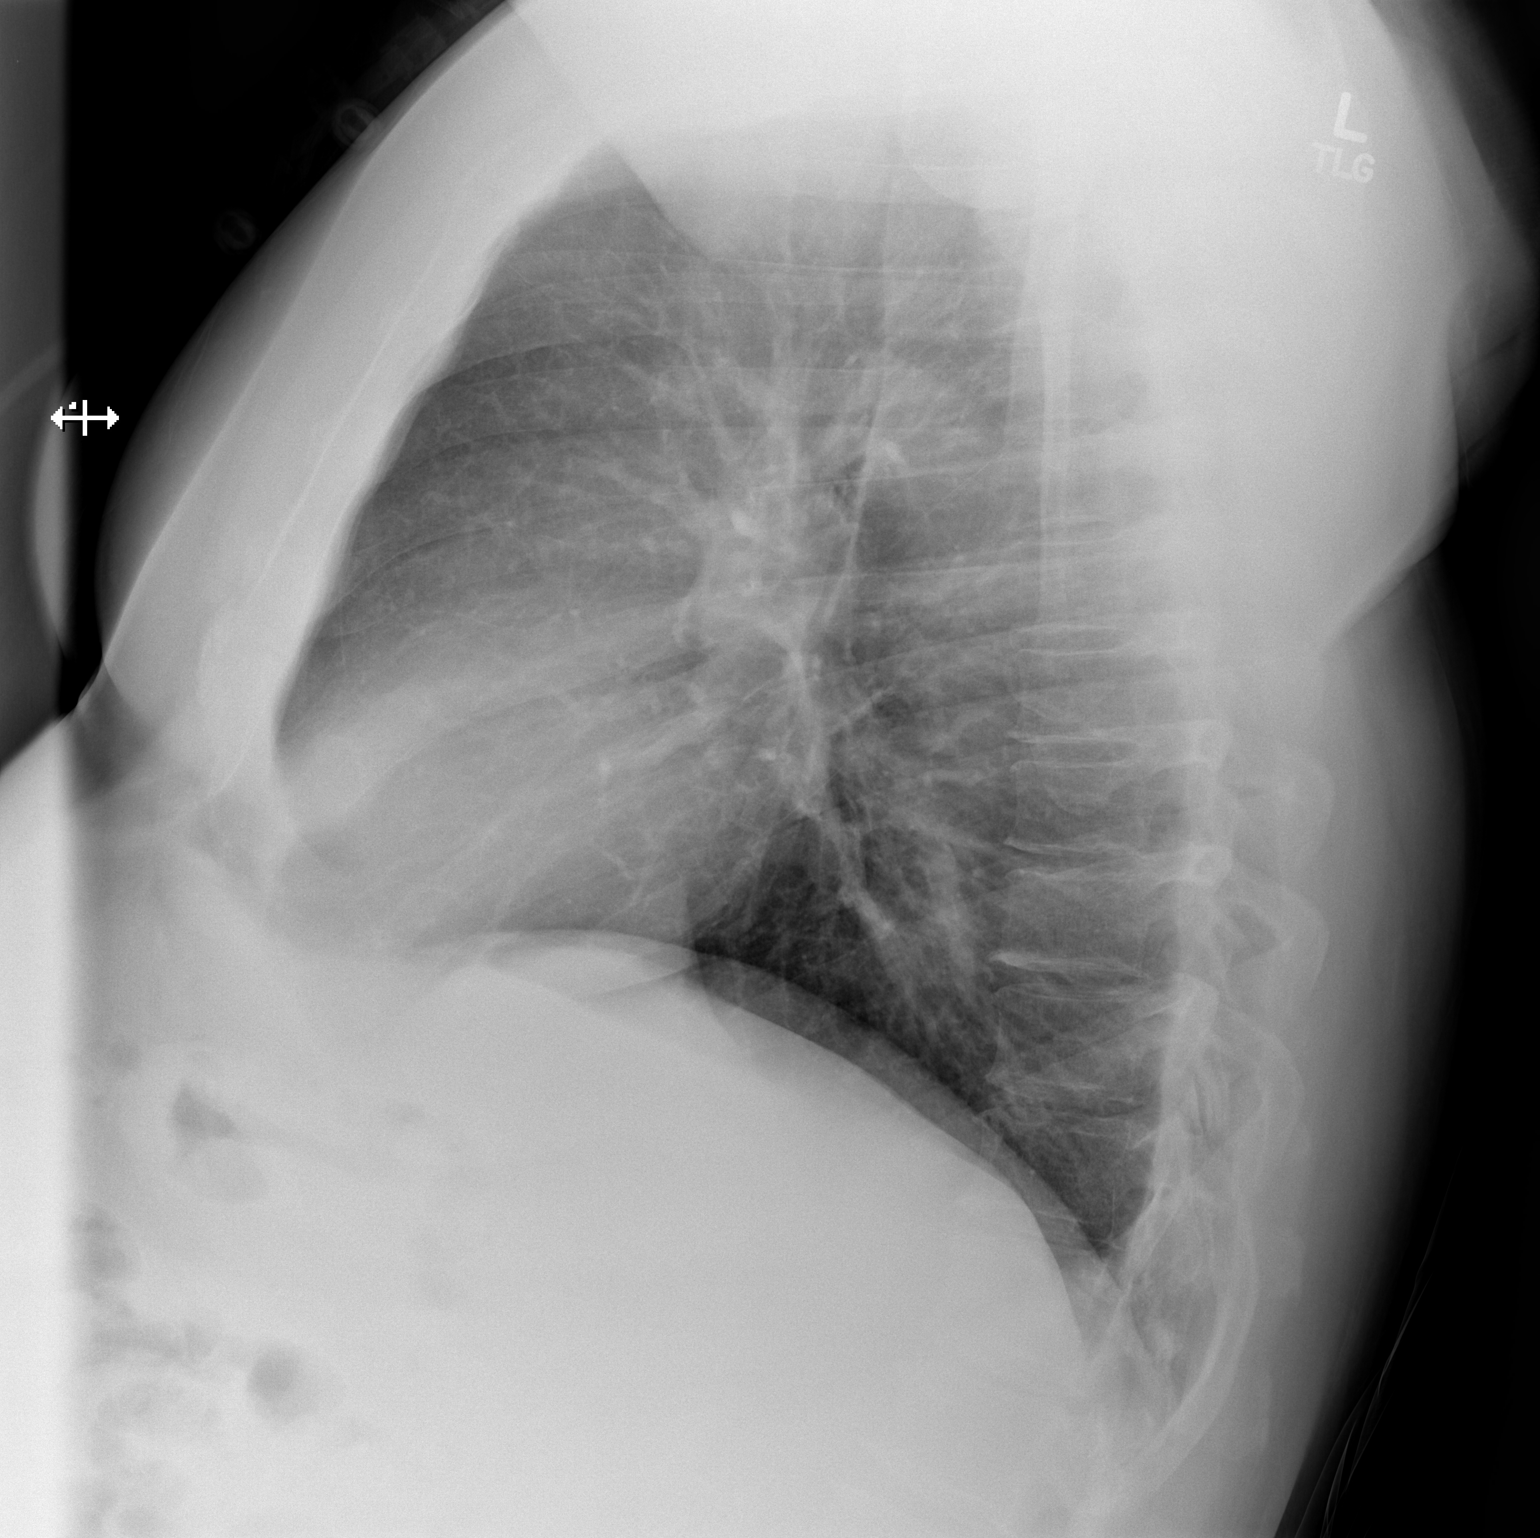

[x chest ap]
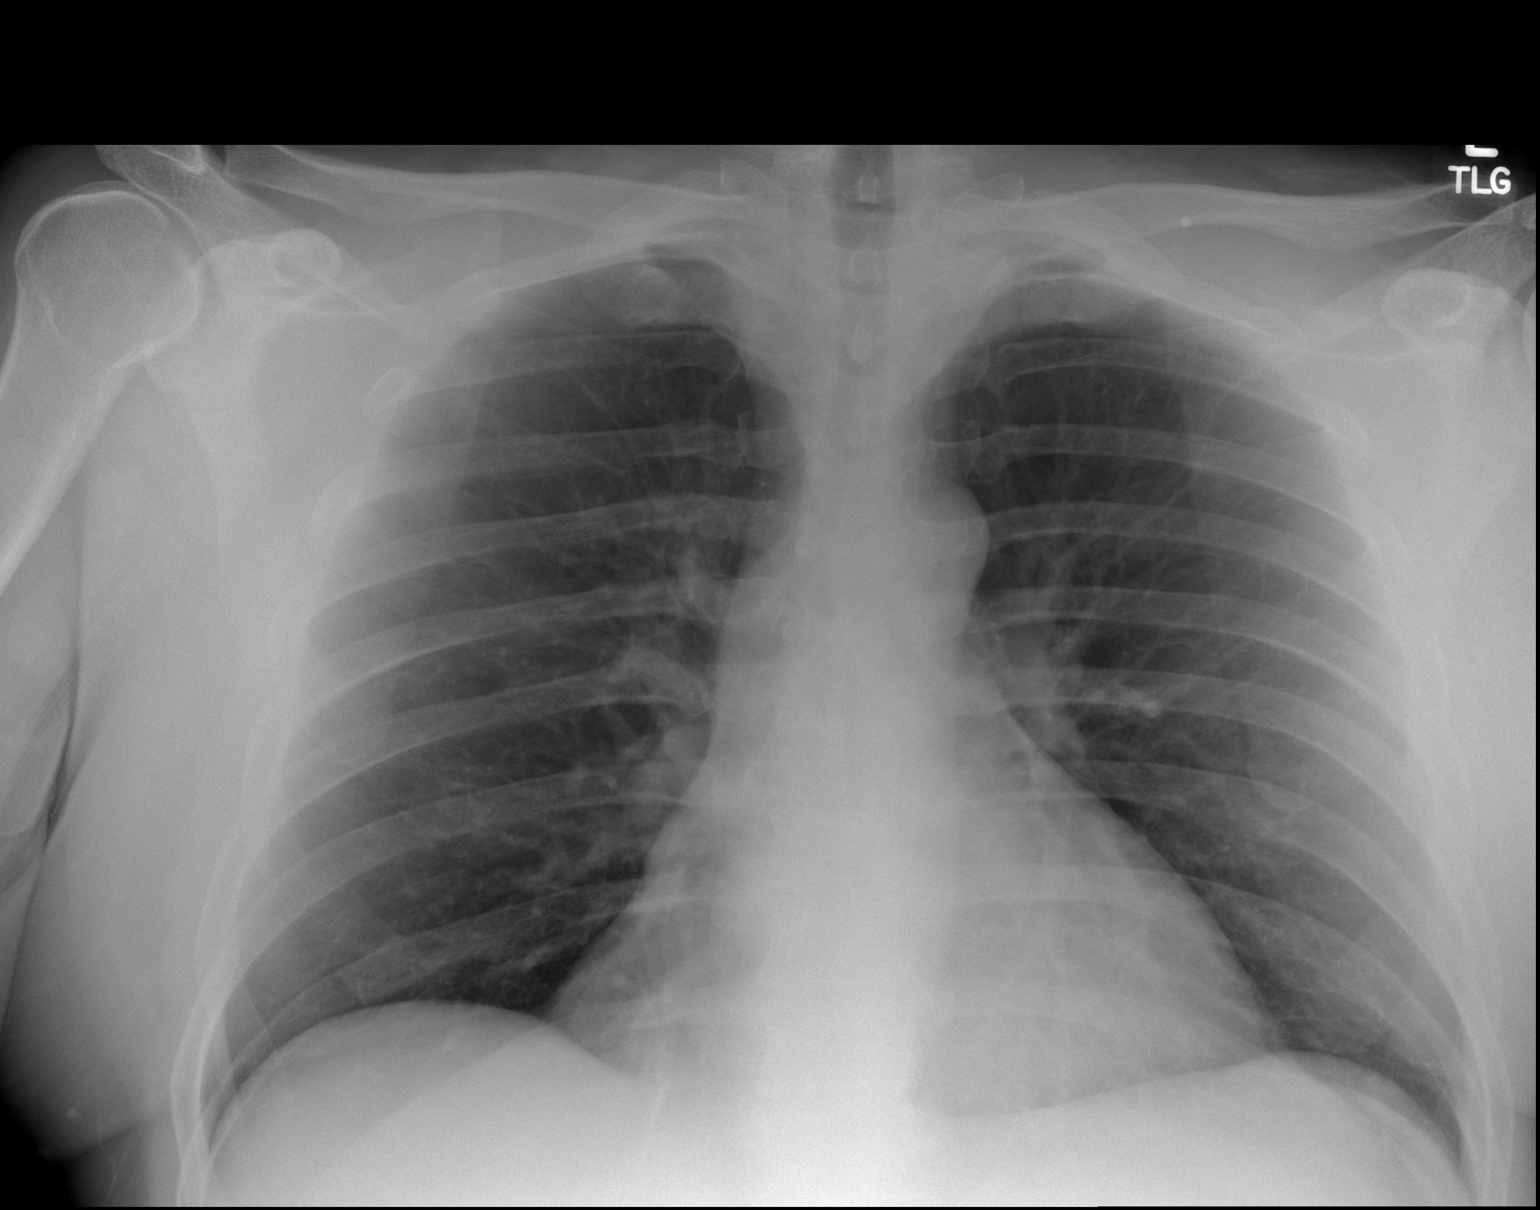

[2 of 2 positions shown; findings below may reference images not displayed]

FINDINGS: The heart size and mediastinal contours are within normal limits.
Both lungs are clear. The visualized skeletal structures are
unremarkable.
IMPRESSION: No active cardiopulmonary disease.

## 2016-05-12 ENCOUNTER — Other Ambulatory Visit: Payer: Self-pay | Admitting: Internal Medicine

## 2016-05-12 DIAGNOSIS — M5412 Radiculopathy, cervical region: Secondary | ICD-10-CM

## 2016-05-14 ENCOUNTER — Ambulatory Visit
Admission: RE | Admit: 2016-05-14 | Discharge: 2016-05-14 | Disposition: A | Discharge status: Home / Self Care | Payer: BLUE CROSS/BLUE SHIELD | Source: Ambulatory Visit | Attending: Internal Medicine | Admitting: Internal Medicine

## 2016-05-14 DIAGNOSIS — M5412 Radiculopathy, cervical region: Secondary | ICD-10-CM

## 2016-10-08 ENCOUNTER — Encounter: Payer: Self-pay | Admitting: Internal Medicine

## 2016-12-16 ENCOUNTER — Ambulatory Visit (INDEPENDENT_AMBULATORY_CARE_PROVIDER_SITE_OTHER): Payer: Self-pay | Admitting: Orthopaedic Surgery

## 2017-01-05 ENCOUNTER — Ambulatory Visit (INDEPENDENT_AMBULATORY_CARE_PROVIDER_SITE_OTHER): Payer: Self-pay | Admitting: Orthopaedic Surgery

## 2017-01-25 ENCOUNTER — Ambulatory Visit (INDEPENDENT_AMBULATORY_CARE_PROVIDER_SITE_OTHER): Payer: BLUE CROSS/BLUE SHIELD | Admitting: Physician Assistant

## 2017-01-25 ENCOUNTER — Ambulatory Visit (INDEPENDENT_AMBULATORY_CARE_PROVIDER_SITE_OTHER): Payer: Self-pay

## 2017-01-25 DIAGNOSIS — Z96651 Presence of right artificial knee joint: Secondary | ICD-10-CM

## 2017-01-25 MED ORDER — IBUPROFEN 800 MG PO TABS: 800.0000 mg | ORAL_TABLET | Freq: Three times a day (TID) | ORAL | 3 refills | Status: DC | PRN

## 2017-01-25 NOTE — Progress Notes (Signed)
Office Visit Note   Patient: Roger Ayers           Date of Birth: Feb 23, 1966           MRN: [id number] Visit Date: 01/25/2017              Requested by: Marton Redwood, MD [address] [place], [place] [address] PCP: Marton Redwood, MD   Assessment & Plan: Visit Diagnoses:  1. History of total right knee replacement     Plan: Continue to work on range of motion strengthening particularly of the right knee but also on strengthening left knee. He did ask about a prescription for ibuprofen he can take this so as directed for the next week or so and then after that distal use as needed. We'll see him back on an as-needed basis.  Follow-Up Instructions: No Follow-up on file.   Orders:  Orders Placed This Encounter  Procedures  . XR Knee 1-2 Views Right   Meds ordered this encounter  Medications  . ibuprofen (ADVIL,MOTRIN) 800 MG tablet    Sig: Take 1 tablet (800 mg total) by mouth every 8 (eight) hours as needed.    Dispense:  90 tablet    Refill:  3      Procedures: No procedures performed   Clinical Data: No additional findings.   Subjective: Chief Complaint  Patient presents with  . Right Knee - Follow-up    HPI Roger Ayers returns today 1 year status post right total knee arthroplasty with removal of plates and screws right tibia. He states that overall her knee is doing okay still has pain in the knee particularly with the bad weather days. Has been seen in pain clinic he states it is given Percocet he is asking for ibuprofen today has since found this beneficial in the past. Review of Systems   Objective: Vital Signs: There were no vitals taken for this visit.  Physical Exam Well developed well-nourished male in no acute distress. Ortho Exam Right knee full extension flexion to 110 easily. No instability valgus varus stressing. No effusion no abnormal warmth surgical incisions well-healed. Atrophy of the quad muscles particularly the VMO. Specialty  Comments:  No specialty comments available.  Imaging: Xr Knee 1-2 Views Right  Result Date: 01/25/2017 AP and lateral views of the right knee: No acute fracture status post right total knee arthroplasty with well-seated components without any complicating features    PMFS History: Patient Active Problem List   Diagnosis Date Noted  . Traumatic arthritis of right knee 12/24/2014  . Status post total right knee replacement 12/24/2014  . Retained orthopedic hardware right tibia; previous tibial plateau fracture 03/08/2014  . S/P right knee arthroscopy 03/08/2014  . Infection of right tibia post ORIF tibial plateau 12/05/2013  . Tibial plateau fracture 11/17/2013   Past Medical History:  Diagnosis Date  . Arthritis   . GERD (gastroesophageal reflux disease)   . Hypertension   . MRSA infection    right leg  . Sleep apnea    cpap    Family History  Problem Relation Age of Onset  . Stroke Father     61s    Past Surgical History:  Procedure Laterality Date  . EXTERNAL FIXATION LEG Right 11/17/2013   Procedure: EXTERNAL FIXATION LEG;  Surgeon: Marianna Payment, MD;  [facility];  Service: Orthopedics;  Laterality: Right;  . EXTERNAL FIXATION REMOVAL Right 11/23/2013   Procedure: REMOVAL EXTERNAL FIXATION RIGHT LEG;  Surgeon: Lind Guest  Ninfa Linden, MD;  [facility];  Service: Orthopedics;  Laterality: Right;  . HARDWARE REMOVAL Right 03/08/2014   Procedure: Removal plate and screws right tibia,;  Surgeon: Mcarthur Rossetti, MD;  [facility];  Service: Orthopedics;  Laterality: Right;  . I&D EXTREMITY Right 12/05/2013   Procedure: IRRIGATION AND DEBRIDEMENT RIGHT LEG;  Surgeon: Mcarthur Rossetti, MD;  Location: [place];  Service: Orthopedics;  Laterality: Right;  . I&D EXTREMITY Right 12/08/2013   Procedure: Repeat IRRIGATION AND DEBRIDEMENT Right Leg with Stimulan bead placement;  Surgeon: Mcarthur Rossetti, MD;  Location: [place];  Service:  Orthopedics;  Laterality: Right;  . KNEE ARTHROSCOPY Right 03/08/2014   Procedure:  arthroscopy RIGHT KNEE WITH EXTENSIVE DEBRIDEMENT;  Surgeon: Mcarthur Rossetti, MD;  [facility];  Service: Orthopedics;  Laterality: Right;  . MOUTH SURGERY  2014   bone grafts for implants  . ORIF TIBIA PLATEAU Right 11/23/2013   Procedure: OPEN REDUCTION INTERNAL FIXATION (ORIF) RIGHT TIBIAL PLATEAU;  Surgeon: Mcarthur Rossetti, MD;  [facility];  Service: Orthopedics;  Laterality: Right;  . TOTAL KNEE ARTHROPLASTY Right 12/24/2014   dr Ninfa Linden  . TOTAL KNEE ARTHROPLASTY Right 12/24/2014   Procedure: RIGHT TOTAL KNEE ARTHROPLASTY;  Surgeon: Mcarthur Rossetti, MD;  Location: [place];  Service: Orthopedics;  Laterality: Right;  . TRIGGER FINGER RELEASE Right ~2013   middle finger   Social History   Occupational History  . Not on file.   Social History Main Topics  . Smoking status: Current Every Day Smoker    Packs/day: 0.50    Years: 20.00    Types: Cigarettes  . Smokeless tobacco: Never Used     Comment: e-cigs  . Alcohol use 0.0 oz/week     Comment: I beer per day  . Drug use: No  . Sexual activity: Not on file

## 2017-12-15 ENCOUNTER — Encounter: Payer: Self-pay | Admitting: Internal Medicine

## 2018-02-03 ENCOUNTER — Ambulatory Visit (AMBULATORY_SURGERY_CENTER): Payer: Self-pay | Admitting: *Deleted

## 2018-02-03 ENCOUNTER — Other Ambulatory Visit: Payer: Self-pay

## 2018-02-03 VITALS — Ht 70.75 in | Wt 269.2 lb

## 2018-02-03 DIAGNOSIS — Z1211 Encounter for screening for malignant neoplasm of colon: Secondary | ICD-10-CM

## 2018-02-03 NOTE — Progress Notes (Signed)
No egg or soy allergy known to patient  No issues with past sedation with any surgeries  or procedures, no intubation problems   diet pills per patient on Phentermine  No home 02 use per patient  No blood thinners per patient  Pt denies issues with constipation  No A fib or A flutter  EMMI video sent to pt's e mail pt. declined

## 2018-02-07 ENCOUNTER — Encounter: Payer: Self-pay | Admitting: Internal Medicine

## 2018-02-17 ENCOUNTER — Encounter: Payer: Self-pay | Admitting: Internal Medicine

## 2018-02-17 ENCOUNTER — Ambulatory Visit (AMBULATORY_SURGERY_CENTER): Payer: BLUE CROSS/BLUE SHIELD | Admitting: Internal Medicine

## 2018-02-17 ENCOUNTER — Other Ambulatory Visit: Payer: Self-pay

## 2018-02-17 VITALS — BP 122/77 | HR 70 | Temp 99.1°F | Resp 11 | Ht 70.75 in | Wt 269.0 lb

## 2018-02-17 DIAGNOSIS — Z1211 Encounter for screening for malignant neoplasm of colon: Secondary | ICD-10-CM | POA: Diagnosis not present

## 2018-02-17 DIAGNOSIS — Z1212 Encounter for screening for malignant neoplasm of rectum: Secondary | ICD-10-CM

## 2018-02-17 DIAGNOSIS — D128 Benign neoplasm of rectum: Secondary | ICD-10-CM | POA: Diagnosis not present

## 2018-02-17 DIAGNOSIS — D124 Benign neoplasm of descending colon: Secondary | ICD-10-CM

## 2018-02-17 DIAGNOSIS — K635 Polyp of colon: Secondary | ICD-10-CM

## 2018-02-17 DIAGNOSIS — D125 Benign neoplasm of sigmoid colon: Secondary | ICD-10-CM | POA: Diagnosis not present

## 2018-02-17 DIAGNOSIS — D120 Benign neoplasm of cecum: Secondary | ICD-10-CM

## 2018-02-17 DIAGNOSIS — D122 Benign neoplasm of ascending colon: Secondary | ICD-10-CM

## 2018-02-17 DIAGNOSIS — D129 Benign neoplasm of anus and anal canal: Secondary | ICD-10-CM

## 2018-02-17 NOTE — Progress Notes (Signed)
Pt's states no medical or surgical changes since previsit or office visit. 

## 2018-02-17 NOTE — Progress Notes (Signed)
Report to PACU, RN, vss, BBS= Clear.  

## 2018-02-17 NOTE — Op Note (Signed)
[facility] Patient Name: Roger Ayers Procedure Date: 02/17/2018 11:50 AM MRN: [id number] Endoscopist: Gatha Mayer , MD Age: 52 Referring MD:  Date of Birth: 1966/08/17 Gender: Male Account #: [id number] Procedure:                Colonoscopy Indications:              Screening for colorectal malignant neoplasm, This                            is the patient's first colonoscopy Medicines:                Propofol per Anesthesia, Monitored Anesthesia Care Procedure:                Pre-Anesthesia Assessment:                           - Prior to the procedure, a History and Physical                            was performed, and patient medications and                            allergies were reviewed. The patient's tolerance of                            previous anesthesia was also reviewed. The risks                            and benefits of the procedure and the sedation                            options and risks were discussed with the patient.                            All questions were answered, and informed consent                            was obtained. Prior Anticoagulants: The patient has                            taken no previous anticoagulant or antiplatelet                            agents. ASA Grade Assessment: II - A patient with                            mild systemic disease. After reviewing the risks                            and benefits, the patient was deemed in                            satisfactory condition to undergo the procedure.  After obtaining informed consent, the colonoscope                            was passed under direct vision. Throughout the                            procedure, the patient's blood pressure, pulse, and                            oxygen saturations were monitored continuously. The                            Colonoscope was introduced through the anus and   advanced to the the cecum, identified by                            appendiceal orifice and ileocecal valve. The                            colonoscopy was performed without difficulty. The                            patient tolerated the procedure well. The quality                            of the bowel preparation was good. The bowel                            preparation used was Miralax. The ileocecal valve,                            appendiceal orifice, and rectum were photographed. Scope In: 12:01:34 PM Scope Out: 12:19:49 PM Scope Withdrawal Time: 0 hours 16 minutes 22 seconds  Total Procedure Duration: 0 hours 18 minutes 15 seconds  Findings:                 The perianal and digital rectal examinations were                            normal. Pertinent negatives include normal prostate                            (size, shape, and consistency).                           A 10 mm polyp was found in the sigmoid colon. The                            polyp was pedunculated. The polyp was removed with                            a hot snare. Resection and retrieval were complete.  Verification of patient identification for the                            specimen was done. Estimated blood loss: none.                           Three sessile polyps were found in the rectum,                            descending colon and cecum. The polyps were                            diminutive in size. These polyps were removed with                            a cold snare. Resection and retrieval were                            complete. Verification of patient identification                            for the specimen was done. Estimated blood loss was                            minimal.                           A few diverticula were found in the sigmoid colon.                           The exam was otherwise without abnormality on                            direct and  retroflexion views. Complications:            No immediate complications. Estimated Blood Loss:     Estimated blood loss was minimal. Impression:               - One 10 mm polyp in the sigmoid colon, removed                            with a hot snare. Resected and retrieved.                           - Three diminutive polyps in the rectum, in the                            descending colon and in the cecum, removed with a                            cold snare. Resected and retrieved.                           - Diverticulosis in the sigmoid colon.                           -  The examination was otherwise normal on direct                            and retroflexion views. Recommendation:           - Patient has a contact number available for                            emergencies. The signs and symptoms of potential                            delayed complications were discussed with the                            patient. Return to normal activities tomorrow.                            Written discharge instructions were provided to the                            patient.                           - Resume previous diet.                           - Continue present medications.                           - No aspirin, ibuprofen, naproxen, or other                            non-steroidal anti-inflammatory drugs for 2 weeks                            after polyp removal.                           - Repeat colonoscopy is recommended. The                            colonoscopy date will be determined after pathology                            results from today's exam become available for                            review. Gatha Mayer, MD 02/17/2018 12:25:51 PM This report has been signed electronically.

## 2018-02-17 NOTE — Patient Instructions (Addendum)
I found and removed 4 polyps that look benign.  You also have a condition called diverticulosis - common and not usually a problem. Please read the handout provided.  I will let you know pathology results and when to have another routine colonoscopy by mail and/or My Chart.  I appreciate the opportunity to care for you. Gatha Mayer, MD, FACG  YOU HAD AN ENDOSCOPIC PROCEDURE TODAY AT [facility]:   Refer to the procedure report that was given to you for any specific questions about what was found during the examination.  If the procedure report does not answer your questions, please call your gastroenterologist to clarify.  If you requested that your care partner not be given the details of your procedure findings, then the procedure report has been included in a sealed envelope for you to review at your convenience later.  YOU SHOULD EXPECT: Some feelings of bloating in the abdomen. Passage of more gas than usual.  Walking can help get rid of the air that was put into your GI tract during the procedure and reduce the bloating. If you had a lower endoscopy (such as a colonoscopy or flexible sigmoidoscopy) you may notice spotting of blood in your stool or on the toilet paper. If you underwent a bowel prep for your procedure, you may not have a normal bowel movement for a few days.  Please Note:  You might notice some irritation and congestion in your nose or some drainage.  This is from the oxygen used during your procedure.  There is no need for concern and it should clear up in a day or so.  SYMPTOMS TO REPORT IMMEDIATELY:   Following lower endoscopy (colonoscopy or flexible sigmoidoscopy):  Excessive amounts of blood in the stool  Significant tenderness or worsening of abdominal pains  Swelling of the abdomen that is new, acute  Fever of 100F or higher  For urgent or emergent issues, a gastroenterologist can be reached at any hour by calling [phone]  [phone].   DIET:  We do recommend a small meal at first, but then you may proceed to your regular diet.  Drink plenty of fluids but you should avoid alcoholic beverages for 24 hours.  ACTIVITY:  You should plan to take it easy for the rest of today and you should NOT DRIVE or use heavy machinery until tomorrow (because of the sedation medicines used during the test).    FOLLOW UP: Our staff will call the number listed on your records the next business day following your procedure to check on you and address any questions or concerns that you may have regarding the information given to you following your procedure. If we do not reach you, we will leave a message.  However, if you are feeling well and you are not experiencing any problems, there is no need to return our call.  We will assume that you have returned to your regular daily activities without incident.  If any biopsies were taken you will be contacted by phone or by letter within the next 1-3 weeks.  Please call us at [phone] if you have not heard about the biopsies in 3 weeks.   SIGNATURES/CONFIDENTIALITY: You and/or your care partner have signed paperwork which will be entered into your electronic medical record.  These signatures attest to the fact that that the information above on your After Visit Summary has been reviewed and is understood.  Full responsibility of the confidentiality of this discharge  information lies with you and/or your care-partner.  Await pathology  Please read over handouts about polyps and diverticulosis  No Aspirin, Aspirin containing products (BC or Goody powders), or NSAIDs (Motrin, Aleve, Naproxen, Advil) for 2 weeks.  Tylenol is ok to take

## 2018-02-17 NOTE — Progress Notes (Signed)
Called to room to assist during endoscopic procedure.  Patient ID and intended procedure confirmed with present staff. Received instructions for my participation in the procedure from the performing physician.  

## 2018-02-20 ENCOUNTER — Telehealth: Payer: Self-pay | Admitting: *Deleted

## 2018-02-20 NOTE — Telephone Encounter (Signed)
  Follow up Call-  Call back number 02/17/2018  Post procedure Call Back phone  # [phone]  Permission to leave phone message Yes  Some recent data might be hidden     Patient questions:  Do you have a fever, pain , or abdominal swelling? No. Pain Score  0 *  Have you tolerated food without any problems? Yes.    Have you been able to return to your normal activities? Yes.    Do you have any questions about your discharge instructions: Diet   No. Medications  No. Follow up visit  No.  Do you have questions or concerns about your Care? No.  Actions: * If pain score is 4 or above: No action needed, pain <4.

## 2018-02-21 ENCOUNTER — Encounter: Payer: Self-pay | Admitting: Internal Medicine

## 2018-02-21 DIAGNOSIS — Z860101 Personal history of adenomatous and serrated colon polyps: Secondary | ICD-10-CM

## 2018-02-21 DIAGNOSIS — Z86010 Personal history of colonic polyps: Secondary | ICD-10-CM

## 2018-02-21 HISTORY — DX: Personal history of colonic polyps: Z86.010

## 2018-02-21 HISTORY — DX: Personal history of adenomatous and serrated colon polyps: Z86.0101

## 2018-02-21 NOTE — Progress Notes (Signed)
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

## 2018-09-22 ENCOUNTER — Ambulatory Visit: Payer: BLUE CROSS/BLUE SHIELD | Admitting: Cardiovascular Disease

## 2018-11-01 ENCOUNTER — Ambulatory Visit: Payer: BLUE CROSS/BLUE SHIELD | Admitting: Cardiovascular Disease

## 2018-11-30 ENCOUNTER — Ambulatory Visit: Payer: BLUE CROSS/BLUE SHIELD | Admitting: Cardiovascular Disease

## 2018-11-30 ENCOUNTER — Encounter: Payer: Self-pay | Admitting: Cardiovascular Disease

## 2018-11-30 VITALS — BP 152/85 | HR 56 | Ht 71.0 in | Wt 261.4 lb

## 2018-11-30 DIAGNOSIS — R0602 Shortness of breath: Secondary | ICD-10-CM

## 2018-11-30 DIAGNOSIS — Z01812 Encounter for preprocedural laboratory examination: Secondary | ICD-10-CM | POA: Diagnosis not present

## 2018-11-30 DIAGNOSIS — Z720 Tobacco use: Secondary | ICD-10-CM

## 2018-11-30 DIAGNOSIS — R0789 Other chest pain: Secondary | ICD-10-CM

## 2018-11-30 DIAGNOSIS — I10 Essential (primary) hypertension: Secondary | ICD-10-CM

## 2018-11-30 DIAGNOSIS — R079 Chest pain, unspecified: Secondary | ICD-10-CM | POA: Diagnosis not present

## 2018-11-30 DIAGNOSIS — E669 Obesity, unspecified: Secondary | ICD-10-CM | POA: Insufficient documentation

## 2018-11-30 DIAGNOSIS — K219 Gastro-esophageal reflux disease without esophagitis: Secondary | ICD-10-CM

## 2018-11-30 HISTORY — DX: Obesity, unspecified: E66.9

## 2018-11-30 HISTORY — DX: Essential (primary) hypertension: I10

## 2018-11-30 HISTORY — DX: Tobacco use: Z72.0

## 2018-11-30 HISTORY — DX: Other chest pain: R07.89

## 2018-11-30 NOTE — Patient Instructions (Addendum)
Medication Instructions:  Your physician recommends that you continue on your current medications as directed. Please refer to the Current Medication list given to you today.  If you need a refill on your cardiac medications before your next appointment, please call your pharmacy.   Lab work: BMET 1 WEEK PRIOR TO CARDIAC CT  If you have labs (blood work) drawn today and your tests are completely normal, you will receive your results only by: Marland Kitchen MyChart Message (if you have MyChart) OR . A paper copy in the mail If you have any lab test that is abnormal or we need to change your treatment, we will call you to review the results.  Testing/Procedures: Your physician has requested that you have cardiac CT. Cardiac computed tomography (CT) is a painless test that uses an x-ray machine to take clear, detailed pictures of your heart. For further information please visit [email or web address]. Please follow instruction sheet as given. ONCE YOUR INSURANCE HAS APPROVED THE OFFICE WILL CALL YOU TO SCHEDULE AN APPOINTMENT   Follow-Up: At [facility], you and your health needs are our priority.  As part of our continuing mission to provide you with exceptional heart care, we have created designated Provider Care Teams.  These Care Teams include your primary Cardiologist (physician) and Advanced Practice Providers (APPs -  Physician Assistants and Nurse Practitioners) who all work together to provide you with the care you need, when you need it. You will need a follow up appointment in 2 months. You may see Skeet Latch, MD or one of the following Advanced Practice Providers on your designated Care Team:   Kerin Ransom, PA-C Roby Lofts, [place] . Sande Rives, PA-C  Any Other Special Instructions Will Be Listed Below (If Applicable).  Please arrive at the [facility] main entrance of [facility] at xx:xx AM (30-45 minutes prior to test start time)  [facility] [place], [place] [address] [phone]  Proceed to the [facility] Radiology Department (First Floor).  Please follow these instructions carefully (unless otherwise directed):  Hold all erectile dysfunction medications at least 48 hours prior to test.  On the Night Before the Test: . Be sure to Drink plenty of water. . Do not consume any caffeinated/decaffeinated beverages or chocolate 12 hours prior to your test. . Do not take any antihistamines 12 hours prior to your test. . If you take Metformin do not take 24 hours prior to test. . If the patient has contrast allergy: ? Patient will need a prescription for Prednisone and very clear instructions (as follows): 1. Prednisone 50 mg - take 13 hours prior to test 2. Take another Prednisone 50 mg 7 hours prior to test 3. Take another Prednisone 50 mg 1 hour prior to test 4. Take Benadryl 50 mg 1 hour prior to test . Patient must complete all four doses of above prophylactic medications. . Patient will need a ride after test due to Benadryl.  On the Day of the Test: . Drink plenty of water. Do not drink any water within one hour of the test. . Do not eat any food 4 hours prior to the test. . You may take your regular medications prior to the test.  . Take metoprolol (Lopressor) two hours prior to test. . HOLD Hydrochlorothiazide morning of the test.   Do not give Lopressor to patients with an allergy to lopressor or anyone with asthma or active COPD symptoms (currently taking steroids).  After the Test: . Drink plenty of water. . After receiving IV contrast, you may experience a mild flushed feeling. This is normal. . On occasion, you may experience a mild rash up to 24 hours after the test. This is not dangerous. If this occurs, you can take Benadryl 25 mg and increase your fluid intake. . If you experience trouble breathing, this can be serious. If it is severe call 911 IMMEDIATELY. If it is mild, please call our  office. . If you take any of these medications: Glipizide/Metformin, Avandament, Glucavance, please do not take 48 hours after completing test.   Cardiac CT Angiogram A cardiac CT angiogram is a procedure to look at the heart and the area around the heart. It may be done to help find the cause of chest pains or other symptoms of heart disease. During this procedure, a large X-ray machine, called a CT scanner, takes detailed pictures of the heart and the surrounding area after a dye (contrast material) has been injected into blood vessels in the area. The procedure is also sometimes called a coronary CT angiogram, coronary artery scanning, or CTA. A cardiac CT angiogram allows the health care provider to see how well blood is flowing to and from the heart. The health care provider will be able to see if there are any problems, such as:  Blockage or narrowing of the coronary arteries in the heart.  Fluid around the heart.  Signs of weakness or disease in the muscles, valves, and tissues of the heart.  Tell a health care provider about:  Any allergies you have. This is especially important if you have had a previous allergic reaction to contrast dye.  All medicines you are taking, including vitamins, herbs, eye drops, creams, and over-the-counter medicines.  Any blood disorders you have.  Any surgeries you have had.  Any medical conditions you have.  Whether you are pregnant or may be pregnant.  Any anxiety disorders, chronic pain, or other conditions you have that may increase your stress or prevent you from lying still. What are the risks? Generally, this is a safe procedure. However, problems may occur, including:  Bleeding.  Infection.  Allergic reactions to medicines or dyes.  Damage to other structures or organs.  Kidney damage from the dye or contrast that is used.  Increased risk of cancer from radiation exposure. This risk is low. Talk with your health care provider  about: ? The risks and benefits of testing. ? How you can receive the lowest dose of radiation.  What happens before the procedure?  Wear comfortable clothing and remove any jewelry, glasses, dentures, and hearing aids.  Follow instructions from your health care provider about eating and drinking. This may include: ? For 12 hours before the test - avoid caffeine. This includes tea, coffee, soda, energy drinks, and diet pills. Drink plenty of water or other fluids that do not have caffeine in them. Being well-hydrated can prevent complications. ? For 4-6 hours before the test - stop eating and drinking. The contrast dye can cause nausea, but this is less likely if your stomach is empty.  Ask your health care provider about changing or stopping your regular medicines. This is especially important if you are taking diabetes medicines, blood thinners, or medicines to treat erectile dysfunction. What happens during the procedure?  Hair on your chest may need to be removed so that small sticky patches called electrodes can be placed on your chest. These will transmit information that helps  to monitor your heart during the test.  An IV tube will be inserted into one of your veins.  You might be given a medicine to control your heart rate during the test. This will help to ensure that good images are obtained.  You will be asked to lie on an exam table. This table will slide in and out of the CT machine during the procedure.  Contrast dye will be injected into the IV tube. You might feel warm, or you may get a metallic taste in your mouth.  You will be given a medicine (nitroglycerin) to relax (dilate) the arteries in your heart.  The table that you are lying on will move into the CT machine tunnel for the scan.  The person running the machine will give you instructions while the scans are being done. You may be asked to: ? Keep your arms above your head. ? Hold your breath. ? Stay very  still, even if the table is moving.  When the scanning is complete, you will be moved out of the machine.  The IV tube will be removed. The procedure may vary among health care providers and hospitals. What happens after the procedure?  You might feel warm, or you may get a metallic taste in your mouth from the contrast dye.  You may have a headache from the nitroglycerin.  After the procedure, drink water or other fluids to wash (flush) the contrast material out of your body.  Contact a health care provider if you have any symptoms of allergy to the contrast. These symptoms include: ? Shortness of breath. ? Rash or hives. ? A racing heartbeat.  Most people can return to their normal activities right after the procedure. Ask your health care provider what activities are safe for you.  It is up to you to get the results of your procedure. Ask your health care provider, or the department that is doing the procedure, when your results will be ready. Summary  A cardiac CT angiogram is a procedure to look at the heart and the area around the heart. It may be done to help find the cause of chest pains or other symptoms of heart disease.  During this procedure, a large X-ray machine, called a CT scanner, takes detailed pictures of the heart and the surrounding area after a dye (contrast material) has been injected into blood vessels in the area.  Ask your health care provider about changing or stopping your regular medicines before the procedure. This is especially important if you are taking diabetes medicines, blood thinners, or medicines to treat erectile dysfunction.  After the procedure, drink water or other fluids to wash (flush) the contrast material out of your body. This information is not intended to replace advice given to you by your health care provider. Make sure you discuss any questions you have with your health care provider. Document Released: 11/25/2008 Document Revised:  11/01/2016 Document Reviewed: 11/01/2016 Elsevier Interactive Patient Education  2017 [employer].

## 2018-11-30 NOTE — Progress Notes (Signed)
Cardiology Office Note   Date:  11/30/2018   ID:  Roger Ayers, DOB 02/03/1966, MRN [id number]  PCP:  Marton Redwood, MD  Cardiologist:   Skeet Latch, MD   Chief Complaint  Patient presents with  . New Patient (Initial Visit)      History of Present Illness: Roger Ayers is a 52 y.o. male with OSA who is being seen today for the evaluation of chest pain at the request of Marton Redwood, MD.  Roger Ayers reports 3-4 episodes of chest pain.  The first occurred in August 2019.  It awakened him from sleep.  The pain was severe, 8-9 out of 10 in severity.  It was associated with shortness of breath but no nausea or diaphoresis.  It lasted for approximately 10 minutes.  He does have a history of GERD, but this felt more like pain and not like his typical burning sensation.  He had several more episodes that occurred at rest.  He does not get much formal exercise but does yard work and goes for walks.  He has not experienced any recurrent chest pain with exertion.  He does note that he is short of breath when walking up stairs.  He has some mild lower extremity edema that is worse in the right leg where he previously had his knee replaced.  He has no orthopnea or PND and the edema improves with elevation.  Of note, Roger Ayers has been out of his blood pressure medication for the last week.  He is currently awaiting a refill from the mail order pharmacy.   Roger Ayers smokes daily.  He has been unable to quit with patches, gum, Wellbutrin or Chantix.  Vaping helped but he eventually started back smoking.  He isn't ready to try again at this time.    Past Medical History:  Diagnosis Date  . Allergy   . Arthritis   . Atypical chest pain 11/30/2018  . Depression   . Essential hypertension 11/30/2018  . GERD (gastroesophageal reflux disease)   . Hx of adenomatous colonic polyps 02/21/2018  . Hypertension   . MRSA infection    right leg  . Obesity (BMI 35.0-39.9 without comorbidity)  11/30/2018  . Sleep apnea    cpap  . Tobacco abuse 11/30/2018    Past Surgical History:  Procedure Laterality Date  . EXTERNAL FIXATION LEG Right 11/17/2013   Procedure: EXTERNAL FIXATION LEG;  Surgeon: Marianna Payment, MD;  Location: [facility];  Service: Orthopedics;  Laterality: Right;  . EXTERNAL FIXATION REMOVAL Right 11/23/2013   Procedure: REMOVAL EXTERNAL FIXATION RIGHT LEG;  Surgeon: Mcarthur Rossetti, MD;  Location: [facility];  Service: Orthopedics;  Laterality: Right;  . HARDWARE REMOVAL Right 03/08/2014   Procedure: Removal plate and screws right tibia,;  Surgeon: Mcarthur Rossetti, MD;  Location: [facility];  Service: Orthopedics;  Laterality: Right;  . I&D EXTREMITY Right 12/05/2013   Procedure: IRRIGATION AND DEBRIDEMENT RIGHT LEG;  Surgeon: Mcarthur Rossetti, MD;  Location: [place];  Service: Orthopedics;  Laterality: Right;  . I&D EXTREMITY Right 12/08/2013   Procedure: Repeat IRRIGATION AND DEBRIDEMENT Right Leg with Stimulan bead placement;  Surgeon: Mcarthur Rossetti, MD;  Location: [place];  Service: Orthopedics;  Laterality: Right;  . KNEE ARTHROSCOPY Right 03/08/2014   Procedure:  arthroscopy RIGHT KNEE WITH EXTENSIVE DEBRIDEMENT;  Surgeon: Mcarthur Rossetti, MD;  Location: [facility];  Service: Orthopedics;  Laterality: Right;  . MOUTH SURGERY  2014   bone grafts  for implants  . ORIF TIBIA PLATEAU Right 11/23/2013   Procedure: OPEN REDUCTION INTERNAL FIXATION (ORIF) RIGHT TIBIAL PLATEAU;  Surgeon: Mcarthur Rossetti, MD;  Location: [facility];  Service: Orthopedics;  Laterality: Right;  . TOTAL KNEE ARTHROPLASTY Right 12/24/2014   dr Ninfa Linden  . TOTAL KNEE ARTHROPLASTY Right 12/24/2014   Procedure: RIGHT TOTAL KNEE ARTHROPLASTY;  Surgeon: Mcarthur Rossetti, MD;  Location: [place];  Service: Orthopedics;  Laterality: Right;  . TRIGGER FINGER RELEASE Right ~2013   middle finger     Current Outpatient Medications  Medication Sig Dispense Refill  .  fluticasone (FLONASE) 50 MCG/ACT nasal spray PLACE 2 SPRAYS INTO EACH NOSTRIL QD  0  . Loratadine (CLARITIN) 10 MG CAPS Take 1 capsule by mouth daily.    . Multiple Vitamin (MULTIVITAMIN WITH MINERALS) TABS tablet Take 1 tablet by mouth daily.    Marland Kitchen olmesartan-hydrochlorothiazide (BENICAR HCT) 40-25 MG tablet     . omeprazole (PRILOSEC) 20 MG capsule Take 20 mg by mouth every morning.    . pramipexole (MIRAPEX) 0.5 MG tablet      No current facility-administered medications for this visit.     Allergies:   Thimerosal    Social History:  The patient  reports that he has been smoking cigarettes and e-cigarettes. He has a 10.00 pack-year smoking history. He has never used smokeless tobacco. He reports that he drinks alcohol. He reports that he does not use drugs.   Family History:  The patient's family history includes Stroke in his father and paternal grandfather.    ROS:  Please see the history of present illness.   Otherwise, review of systems are positive for none.   All other systems are reviewed and negative.    PHYSICAL EXAM: VS:  BP (!) 152/85   Pulse (!) 56   Ht 5\' 11"  (1.803 m)   Wt 261 lb 6.4 oz (118.6 kg)   BMI 36.46 kg/m  , BMI Body mass index is 36.46 kg/m. GENERAL:  Well appearing HEENT:  Pupils equal round and reactive, fundi not visualized, oral mucosa unremarkable NECK:  No jugular venous distention, waveform within normal limits, carotid upstroke brisk and symmetric, no bruits HEART:  RRR.  PMI not displaced or sustained,S1 and S2 within normal limits, no S3, no S4, no clicks, no rubs, no murmurs ABD:  Flat, positive bowel sounds normal in frequency in pitch, no bruits, no rebound, no guarding, no midline pulsatile mass, no hepatomegaly, no splenomegaly EXT:  2 plus pulses throughout, no edema, no cyanosis no clubbing SKIN:  No rashes no nodules NEURO:  Cranial nerves II through XII grossly intact, motor grossly intact throughout PSYCH:  Cognitively intact,  oriented to person place and time   EKG:  EKG is ordered today. The ekg ordered today demonstrates sinus bradycardia.  Rate 56 bpm.  LPFB.  Low voltage limb leads.     Recent Labs: No results found for requested labs within last 8760 hours.    Lipid Panel No results found for: CHOL, TRIG, HDL, CHOLHDL, VLDL, LDLCALC, LDLDIRECT    Wt Readings from Last 3 Encounters:  11/30/18 261 lb 6.4 oz (118.6 kg)  02/17/18 269 lb (122 kg)  02/03/18 269 lb 3.2 oz (122.1 kg)      ASSESSMENT AND PLAN:  # Atypical chest pain:  Symptoms are atypical.  However he has risk factors and doesn't get much exercise.  He does have exertional dyspnea.  We will get a coronary CT-A to assess for  CAD.   # Hypertension: BP poorly controlled but he has been out of his meds.  Continue olmesartan/HCTZ.  # CV Disease Prevention: Get a copy of lipids from PCP.   # Tobacco abuse: Cessation advised but he isn't ready to try again at this time.   # Obesity: BMI 36.  Will discuss diet and exercise at follow up pending the above testing.    Current medicines are reviewed at length with the patient today.  The patient does not have concerns regarding medicines.  The following changes have been made:  no change  Labs/ tests ordered today include:   Orders Placed This Encounter  Procedures  . CT CORONARY MORPH W/CTA COR W/SCORE W/CA W/CM &/OR WO/CM  . CT CORONARY FRACTIONAL FLOW RESERVE DATA PREP  . CT CORONARY FRACTIONAL FLOW RESERVE FLUID ANALYSIS  . Basic metabolic panel  . EKG 12-Lead     Disposition:   FU with Roger Curenton C. Oval Linsey, MD, [facility] in 2 months.      Signed, Katharyn Schauer C. Oval Linsey, MD, [facility]  11/30/2018 12:26 PM    [place]

## 2019-01-03 ENCOUNTER — Telehealth (HOSPITAL_COMMUNITY): Payer: Self-pay | Admitting: Emergency Medicine

## 2019-01-03 NOTE — Telephone Encounter (Signed)
Left message on voicemail with name and callback number Saundra Gin RN Navigator Cardiac Imaging [phone] 

## 2019-01-04 LAB — BASIC METABOLIC PANEL
BUN/Creatinine Ratio: 10 (ref 9–20)
BUN: 11 mg/dL (ref 6–24)
CALCIUM: 8.9 mg/dL (ref 8.7–10.2)
CO2: 29 mmol/L (ref 20–29)
Chloride: 101 mmol/L (ref 96–106)
Creatinine, Ser: 1.07 mg/dL (ref 0.76–1.27)
GFR calc Af Amer: 92 mL/min/{1.73_m2} (ref >59)
GFR, EST NON AFRICAN AMERICAN: 79 mL/min/{1.73_m2} (ref >59)
Glucose: 116 mg/dL — ABNORMAL HIGH (ref 65–99)
POTASSIUM: 3.6 mmol/L (ref 3.5–5.2)
Sodium: 136 mmol/L (ref 134–144)

## 2019-01-05 ENCOUNTER — Ambulatory Visit (HOSPITAL_COMMUNITY): Admission: RE | Admit: 2019-01-05 | Payer: BLUE CROSS/BLUE SHIELD | Source: Ambulatory Visit

## 2019-01-05 ENCOUNTER — Ambulatory Visit (HOSPITAL_COMMUNITY)
Admission: RE | Admit: 2019-01-05 | Discharge: 2019-01-05 | Disposition: A | Discharge status: Home / Self Care | Payer: BLUE CROSS/BLUE SHIELD | Source: Ambulatory Visit | Attending: Cardiovascular Disease | Admitting: Cardiovascular Disease

## 2019-01-05 DIAGNOSIS — R079 Chest pain, unspecified: Secondary | ICD-10-CM | POA: Diagnosis present

## 2019-01-05 DIAGNOSIS — R0602 Shortness of breath: Secondary | ICD-10-CM

## 2019-01-05 DIAGNOSIS — Z006 Encounter for examination for normal comparison and control in clinical research program: Secondary | ICD-10-CM

## 2019-01-05 MED ORDER — NITROGLYCERIN 0.4 MG SL SUBL
0.8000 mg | SUBLINGUAL_TABLET | Freq: Once | SUBLINGUAL | Status: AC
  Administered 2019-01-05: 0.8 mg via SUBLINGUAL

## 2019-01-05 MED ORDER — NITROGLYCERIN 0.4 MG SL SUBL
SUBLINGUAL_TABLET | SUBLINGUAL | Status: AC
  Administered 2019-01-05: 0.8 mg via SUBLINGUAL
  Filled 2019-01-05: qty 2

## 2019-01-05 MED ORDER — METOPROLOL TARTRATE 5 MG/5ML IV SOLN
5.0000 mg | INTRAVENOUS | Status: DC | PRN
  Administered 2019-01-05: 5 mg via INTRAVENOUS
  Filled 2019-01-05: qty 5

## 2019-01-05 MED ORDER — METOPROLOL TARTRATE 5 MG/5ML IV SOLN
INTRAVENOUS | Status: AC
  Administered 2019-01-05: 5 mg via INTRAVENOUS
  Filled 2019-01-05: qty 10

## 2019-01-05 MED ORDER — IOPAMIDOL (ISOVUE-370) INJECTION 76%
100.0000 mL | Freq: Once | INTRAVENOUS | Status: AC | PRN
  Administered 2019-01-05: 100 mL via INTRAVENOUS

## 2019-01-05 NOTE — Research (Signed)
Cadfem Informed Consent    Patient Name: Roger Ayers   Subject met inclusion and exclusion criteria.  The informed consent form, study requirements and expectations were reviewed with the subject and questions and concerns were addressed prior to the signing of the consent form.  The subject verbalized understanding of the trail requirements.  The subject agreed to participate in the CADFEM trial and signed the informed consent.  The informed consent was obtained prior to performance of any protocol-specific procedures for the subject.  A copy of the signed informed consent was given to the subject and a copy was placed in the subject's medical record.   [name]

## 2019-01-11 ENCOUNTER — Telehealth: Payer: Self-pay | Admitting: *Deleted

## 2019-01-11 NOTE — Telephone Encounter (Signed)
Left message to call back  

## 2019-01-11 NOTE — Telephone Encounter (Signed)
Advised patient of results.  

## 2019-01-11 NOTE — Telephone Encounter (Signed)
-----   Message from Skeet Latch, MD sent at 01/05/2019  5:46 PM EST ----- Very mild CAD.  This would not cause chest pain.

## 2019-02-19 ENCOUNTER — Telehealth: Payer: Self-pay | Admitting: Cardiovascular Disease

## 2019-02-19 NOTE — Telephone Encounter (Signed)
Pt reports that he canceled his follow up 02/23/19 with Dr. Oval Linsey because he his feeling better and will call if he needs anything thereafter. Will see Dr. Brigitte Pulse his PMD to follow up for his BP.

## 2019-02-19 NOTE — Telephone Encounter (Signed)
LMTCB

## 2019-02-19 NOTE — Telephone Encounter (Signed)
New Message:    Pt will not be able to come for his 02-23-19 appt. His question is, does he need to reschedule, since he had his test and received his results.

## 2019-02-21 NOTE — Telephone Encounter (Signed)
Will make Dr Sparta aware   

## 2019-02-23 ENCOUNTER — Ambulatory Visit: Payer: BLUE CROSS/BLUE SHIELD | Admitting: Cardiovascular Disease

## 2019-12-24 ENCOUNTER — Other Ambulatory Visit: Payer: Self-pay

## 2019-12-24 ENCOUNTER — Ambulatory Visit: Payer: BC Managed Care – PPO | Attending: Internal Medicine

## 2019-12-24 DIAGNOSIS — Z20822 Contact with and (suspected) exposure to covid-19: Secondary | ICD-10-CM

## 2019-12-26 LAB — NOVEL CORONAVIRUS, NAA: SARS-CoV-2, NAA: NOT DETECTED

## 2020-02-08 ENCOUNTER — Other Ambulatory Visit: Payer: BC Managed Care – PPO

## 2020-06-05 ENCOUNTER — Other Ambulatory Visit: Payer: Self-pay | Admitting: Internal Medicine

## 2020-06-05 DIAGNOSIS — F17209 Nicotine dependence, unspecified, with unspecified nicotine-induced disorders: Secondary | ICD-10-CM

## 2021-04-27 ENCOUNTER — Ambulatory Visit: Payer: No Typology Code available for payment source | Admitting: Allergy

## 2021-04-29 ENCOUNTER — Other Ambulatory Visit: Payer: Self-pay

## 2021-04-29 ENCOUNTER — Ambulatory Visit: Payer: No Typology Code available for payment source | Admitting: Allergy & Immunology

## 2021-04-29 ENCOUNTER — Encounter: Payer: Self-pay | Admitting: Allergy & Immunology

## 2021-04-29 VITALS — BP 140/88 | HR 73 | Temp 98.3°F | Resp 14 | Ht 71.0 in | Wt 234.8 lb

## 2021-04-29 DIAGNOSIS — J310 Chronic rhinitis: Secondary | ICD-10-CM | POA: Diagnosis not present

## 2021-04-29 DIAGNOSIS — J4540 Moderate persistent asthma, uncomplicated: Secondary | ICD-10-CM | POA: Diagnosis not present

## 2021-04-29 MED ORDER — MONTELUKAST SODIUM 10 MG PO TABS: 10.0000 mg | ORAL_TABLET | Freq: Every day | ORAL | 3 refills | Status: DC

## 2021-04-29 MED ORDER — AZELASTINE HCL 0.1 % NA SOLN: 2.0000 | Freq: Two times a day (BID) | NASAL | Status: DC | PRN

## 2021-04-29 MED ORDER — AZELASTINE HCL 0.15 % NA SOLN: 2.0000 | Freq: Two times a day (BID) | NASAL | 5 refills | Status: AC

## 2021-04-29 NOTE — Patient Instructions (Addendum)
1. Moderate persistent asthma, uncomplicated - Lung testing originally was in the 50% range, but it did improve with the albuterol puffs to 60% or so.  - I would like to try you on a daily controller medication to see if this helps with your symptoms.  - You might have developed some COPD from the smoking and treatment with the inhaler EVERY day could help with inflammation in your lungs. - Daily controller medication(s): Trelegy 100/62.5/25 one puff once daily - Prior to physical activity: albuterol 2 puffs 10-15 minutes before physical activity. - Rescue medications: albuterol 4 puffs every 4-6 hours as needed - Asthma control goals:  * Full participation in all desired activities (may need albuterol before activity) * Albuterol use two time or less a week on average (not counting use with activity) * Cough interfering with sleep two time or less a month * Oral steroids no more than once a year * No hospitalizations  2. Chronic rhinitis - Testing today showed: dermatographic (red, raised bumps) to the entire panel, so we are going to get lab work instead. - We will call you in 1-2 weeks with the results of the testing.  - Start the prednisone dose pack provided today. - Continue with: Flonase (fluticasone) one spray per nostril daily - Start taking: Singulair (montelukast) 10mg  daily and Astelin (azelastine) 2 sprays per nostril 1-2 times daily as needed  - Singulair can cause nightmares, irritability, and mood changes so beware of this. - You can use an extra dose of the antihistamine, if needed, for breakthrough symptoms.  - Consider nasal saline rinses 1-2 times daily to remove allergens from the nasal cavities as well as help with mucous clearance (this is especially helpful to do before the nasal sprays are given) - We have other nose sprays we could use, but they typically require that patient's "fail" several other nose sprays.  3. Return in about 4 weeks (around 05/27/2021).     Please inform us of any Emergency Department visits, hospitalizations, or changes in symptoms. Call us before going to the ED for breathing or allergy symptoms since we might be able to fit you in for a sick visit. Feel free to contact us anytime with any questions, problems, or concerns.  It was a pleasure to meet you today!  Websites that have reliable patient information: 1. American Academy of Asthma, Allergy, and Immunology: www.aaaai.org 2. Food Allergy Research and Education (FARE): foodallergy.org 3. Mothers of Asthmatics: http://www.asthmacommunitynetwork.org 4. American College of Allergy, Asthma, and Immunology: www.acaai.org   COVID-19 Vaccine Information can be found at: ShippingScam.co.uk For questions related to vaccine distribution or appointments, please email [email or web address] or call [phone] [phone].   We realize that you might be concerned about having an allergic reaction to the COVID19 vaccines. To help with that concern, WE ARE OFFERING THE COVID19 VACCINES IN OUR OFFICE! Ask the front desk for dates!     "Like" [place] on Facebook and Instagram for our latest updates!      A healthy democracy works best when [employer] participate! Make sure you are registered to vote! If you have moved or changed any of your contact information, you will need to get this updated before voting!  In some cases, you MAY be able to register to vote online: [email or web address]

## 2021-04-29 NOTE — Progress Notes (Signed)
NEW PATIENT  Date of Service/Encounter:  04/29/21  Consult requested by: Ginger Organ., MD   Assessment:   Moderate persistent asthma, uncomplicated - with impressive reversal with bronchodilator  Chronic rhinitis - highly dermatographic today  Current smoker  Plan/Recommendations:    1. Moderate persistent asthma, uncomplicated - Lung testing originally was in the 50% range, but it did improve with the albuterol puffs to 60% or so.  - I would like to try you on a daily controller medication to see if this helps with your symptoms.  - You might have developed some COPD from the smoking and treatment with the inhaler EVERY day could help with inflammation in your lungs. - Daily controller medication(s): Trelegy 100/62.5/25 one puff once daily - Prior to physical activity: albuterol 2 puffs 10-15 minutes before physical activity. - Rescue medications: albuterol 4 puffs every 4-6 hours as needed - Asthma control goals:  * Full participation in all desired activities (may need albuterol before activity) * Albuterol use two time or less a week on average (not counting use with activity) * Cough interfering with sleep two time or less a month * Oral steroids no more than once a year * No hospitalizations  2. Chronic rhinitis - Testing today showed: dermatographic (red, raised bumps) to the entire panel, so we are going to get lab work instead. - We will call you in 1-2 weeks with the results of the testing.  - Start the prednisone dose pack provided today. - Continue with: Flonase (fluticasone) one spray per nostril daily - Start taking: Singulair (montelukast) 10mg  daily and Astelin (azelastine) 2 sprays per nostril 1-2 times daily as needed  - Singulair can cause nightmares, irritability, and mood changes so beware of this. - You can use an extra dose of the antihistamine, if needed, for breakthrough symptoms.  - Consider nasal saline rinses 1-2 times daily to remove  allergens from the nasal cavities as well as help with mucous clearance (this is especially helpful to do before the nasal sprays are given) - We have other nose sprays we could use, but they typically require that patient's "fail" several other nose sprays.  3. Return in about 4 weeks (around 05/27/2021).   This note in its entirety was forwarded to the Provider who requested this consultation.  Subjective:   Roger Ayers is a 55 y.o. male presenting today for evaluation of  Chief Complaint  Patient presents with  . Allergic Rhinitis     Says he hs been congested for the past 4 years. Post nasal drip that goes to chest. Has issues breathing with congestion and has issues with cough and runny nose as well as sneezing spells. Never had issues with allergies as a child.     Roger Ayers has a history of the following: Patient Active Problem List   Diagnosis Date Noted  . Atypical chest pain 11/30/2018  . GERD (gastroesophageal reflux disease) 11/30/2018  . Essential hypertension 11/30/2018  . Roger Ayers 11/30/2018  . Obesity (BMI 35.0-39.9 without comorbidity) 11/30/2018  . Hx of adenomatous colonic polyps 02/21/2018  . Traumatic arthritis of right knee 12/24/2014  . Status post total right knee replacement 12/24/2014  . Retained orthopedic hardware right tibia; previous tibial plateau fracture 03/08/2014  . S/P right knee arthroscopy 03/08/2014  . Infection of right tibia post ORIF tibial plateau 12/05/2013  . Tibial plateau fracture 11/17/2013    History obtained from: chart review and patient.  Roger Ayers was referred by  Ginger Organ., MD.     Roger Ayers is a 55 y.o. male presenting for an evaluation of recurrnet pneumonia and allergic rhinitis.   Asthma/Respiratory Symptom History: He gets a "touch of pneumonia" twice per year. He uses albuterol a couple of times per day as needed. This was originally given for "bronchitis". He did have a spirometry when he was  in high school. He has never seen Pulmonology.  Allergic Rhinitis Symptom History: He has been congested for four years. He then sneezes several times and blows his nose. He is clear for 20 minutes and the whole process starts over again. He goes through [employer]. This is throughout the entire year. He has not noticed that it gets worse around a particular time of the year. He has tried Claritin and Zyrtec without much relief. He has been on Xyzal. Nothing seems to work much at all. He does use Flonase which "tingles" but he notices no improvement with it. Sudafed works well and sometimes Benadryl. He was on Benadryl nightly for a while, but it was making him groggy in the mornings, so he stopped. He did have allergy testing years ago. He had some grass that was positive at one point.  He will get some cold sores when he gets sun burned. He has acyclovir to use as needed. He does not have a Dermatologist yet. They have lived here from [place] around five years ago.   Otherwise, there is no history of other atopic diseases, including food allergies, drug allergies, stinging insect allergies, eczema, urticaria or contact dermatitis. There is no significant infectious history. Vaccinations are up to date.    Past Medical History: Patient Active Problem List   Diagnosis Date Noted  . Atypical chest pain 11/30/2018  . GERD (gastroesophageal reflux disease) 11/30/2018  . Essential hypertension 11/30/2018  . Roger Ayers 11/30/2018  . Obesity (BMI 35.0-39.9 without comorbidity) 11/30/2018  . Hx of adenomatous colonic polyps 02/21/2018  . Traumatic arthritis of right knee 12/24/2014  . Status post total right knee replacement 12/24/2014  . Retained orthopedic hardware right tibia; previous tibial plateau fracture 03/08/2014  . S/P right knee arthroscopy 03/08/2014  . Infection of right tibia post ORIF tibial plateau 12/05/2013  . Tibial plateau fracture 11/17/2013    Medication List:   Allergies as of 04/29/2021      Reactions   Thimerosal    Back when he was in high school (dicontinued med)      Medication List       Accurate as of Apr 29, 2021  5:17 PM. If you have any questions, ask your nurse or doctor.        acyclovir 400 MG tablet Commonly known as: ZOVIRAX Take by mouth.   albuterol 108 (90 Base) MCG/ACT inhaler Commonly known as: VENTOLIN HFA SMARTSIG:2 Puff(s) By Mouth Every 4-6 Hours PRN   Azelastine HCl 0.15 % Soln Place 2 sprays into both nostrils 2 (two) times daily. Started by: Valentina Shaggy, MD   fluticasone 50 MCG/ACT nasal spray Commonly known as: FLONASE PLACE 2 SPRAYS INTO EACH NOSTRIL QD   ibuprofen 800 MG tablet Commonly known as: ADVIL Take by mouth.   Loratadine 10 MG Caps Take 1 capsule by mouth daily.   montelukast 10 MG tablet Commonly known as: SINGULAIR Take 1 tablet (10 mg total) by mouth daily. Started by: Valentina Shaggy, MD   multivitamin with minerals Tabs tablet Take 1 tablet by mouth daily.   olmesartan-hydrochlorothiazide 40-25 MG tablet  Commonly known as: BENICAR HCT   omeprazole 20 MG capsule Commonly known as: PRILOSEC Take 20 mg by mouth every morning.   pramipexole 0.5 MG tablet Commonly known as: MIRAPEX       Birth History: non-contributory  Developmental History: non-contributory  Past Surgical History: Past Surgical History:  Procedure Laterality Date  . EXTERNAL FIXATION LEG Right 11/17/2013   Procedure: EXTERNAL FIXATION LEG;  Surgeon: Marianna Payment, MD;  Location: [facility];  Service: Orthopedics;  Laterality: Right;  . EXTERNAL FIXATION REMOVAL Right 11/23/2013   Procedure: REMOVAL EXTERNAL FIXATION RIGHT LEG;  Surgeon: Mcarthur Rossetti, MD;  Location: [facility];  Service: Orthopedics;  Laterality: Right;  . HARDWARE REMOVAL Right 03/08/2014   Procedure: Removal plate and screws right tibia,;  Surgeon: Mcarthur Rossetti, MD;  Location: [facility];  Service:  Orthopedics;  Laterality: Right;  . I & D EXTREMITY Right 12/05/2013   Procedure: IRRIGATION AND DEBRIDEMENT RIGHT LEG;  Surgeon: Mcarthur Rossetti, MD;  Location: [facility];  Service: Orthopedics;  Laterality: Right;  . I & D EXTREMITY Right 12/08/2013   Procedure: Repeat IRRIGATION AND DEBRIDEMENT Right Leg with Stimulan bead placement;  Surgeon: Mcarthur Rossetti, MD;  Location: [facility];  Service: Orthopedics;  Laterality: Right;  . KNEE ARTHROSCOPY Right 03/08/2014   Procedure:  arthroscopy RIGHT KNEE WITH EXTENSIVE DEBRIDEMENT;  Surgeon: Mcarthur Rossetti, MD;  Location: [facility];  Service: Orthopedics;  Laterality: Right;  . MOUTH SURGERY  2014   bone grafts for implants  . ORIF TIBIA PLATEAU Right 11/23/2013   Procedure: OPEN REDUCTION INTERNAL FIXATION (ORIF) RIGHT TIBIAL PLATEAU;  Surgeon: Mcarthur Rossetti, MD;  Location: [facility];  Service: Orthopedics;  Laterality: Right;  . TOTAL KNEE ARTHROPLASTY Right 12/24/2014   dr Ninfa Linden  . TOTAL KNEE ARTHROPLASTY Right 12/24/2014   Procedure: RIGHT TOTAL KNEE ARTHROPLASTY;  Surgeon: Mcarthur Rossetti, MD;  Location: [facility];  Service: Orthopedics;  Laterality: Right;  . TRIGGER FINGER RELEASE Right ~2013   middle finger     Family History: Family History  Problem Relation Age of Onset  . Stroke Father        37s  . Stroke Paternal Grandfather   . Colon cancer Neg Hx   . Colon polyps Neg Hx   . Esophageal cancer Neg Hx   . Rectal cancer Neg Hx   . Stomach cancer Neg Hx      Social History: Roger Ayers lives at home with his family.  He lives in a house that is 127 years.  There is wood flooring throughout the home.  They have gas heating and cooling.  There is a dog inside of the home with parents for does not require old outside of the home.  The dog bit sleep in the bed.  There are dust mite covers on the bedding.  There is no Roger exposure in the house, but he does smoke in the car.  He smokes 1 pack/day for the  past 30 years.  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  He does not use a HEPA filter.  He is not exposed to fumes, chemicals, or dust.   Review of Systems  Constitutional: Negative.  Negative for chills, fever, malaise/fatigue and weight loss.  HENT: Negative.  Negative for congestion, ear discharge, ear pain, sinus pain and sore throat.   Eyes: Negative for pain, discharge and redness.  Respiratory: Negative for cough, sputum production, shortness of breath  and wheezing.   Cardiovascular: Negative.  Negative for chest pain and palpitations.  Gastrointestinal: Negative for abdominal pain, constipation, diarrhea, heartburn, nausea and vomiting.  Skin: Negative.  Negative for itching and rash.  Neurological: Negative for dizziness and headaches.  Endo/Heme/Allergies: Negative for environmental allergies. Does not bruise/bleed easily.       Objective:   Blood pressure 140/88, pulse 73, temperature 98.3 F (36.8 C), resp. rate 14, height 5\' 11"  (1.803 m), weight 234 lb 12.8 oz (106.5 kg), SpO2 95 %. Body mass index is 32.75 kg/m.   Physical Exam:   Physical Exam Constitutional:      Appearance: He is well-developed.  HENT:     Head: Normocephalic and atraumatic.     Right Ear: Tympanic membrane, ear canal and external ear normal. No drainage, swelling or tenderness. Tympanic membrane is not injected, scarred, erythematous, retracted or bulging.     Left Ear: Tympanic membrane, ear canal and external ear normal. No drainage, swelling or tenderness. Tympanic membrane is not injected, scarred, erythematous, retracted or bulging.     Nose: No nasal deformity, septal deviation, mucosal edema or rhinorrhea.     Right Turbinates: Enlarged, swollen and pale.     Left Turbinates: Enlarged, swollen and pale.     Right Sinus: No maxillary sinus tenderness or frontal sinus tenderness.     Left Sinus: No maxillary sinus tenderness or frontal sinus  tenderness.     Mouth/Throat:     Mouth: Mucous membranes are not pale and not dry.     Pharynx: Uvula midline.  Eyes:     General:        Right eye: No discharge.        Left eye: No discharge.     Conjunctiva/sclera: Conjunctivae normal.     Right eye: Right conjunctiva is not injected. No chemosis.    Left eye: Left conjunctiva is not injected. No chemosis.    Pupils: Pupils are equal, round, and reactive to light.  Cardiovascular:     Rate and Rhythm: Normal rate and regular rhythm.     Heart sounds: Normal heart sounds.  Pulmonary:     Effort: Pulmonary effort is normal. No tachypnea, accessory muscle usage or respiratory distress.     Breath sounds: Normal breath sounds. No wheezing, rhonchi or rales.     Comments: Decreased air movement at the bases. Chest:     Chest wall: No tenderness.  Abdominal:     Tenderness: There is no abdominal tenderness. There is no guarding or rebound.  Lymphadenopathy:     Head:     Right side of head: No submandibular, tonsillar or occipital adenopathy.     Left side of head: No submandibular, tonsillar or occipital adenopathy.     Cervical: No cervical adenopathy.  Skin:    General: Skin is warm.     Capillary Refill: Capillary refill takes less than 2 seconds.     Coloration: Skin is not pale.     Findings: No abrasion, erythema, petechiae or rash. Rash is not papular, urticarial or vesicular.     Comments: No eczematous or urticarial lesions noted.  Neurological:     Mental Status: He is alert.      Diagnostic studies:    Spirometry: results abnormal (FEV1: 1.90/49%, FVC: 4.05/81%, FEV1/FVC: 47%).    Spirometry consistent with severe obstructive disease.   Allergy Studies:     Airborne Adult Perc - 04/29/21 1047    Time Antigen Placed 1047  Allergen Manufacturer Greer    Location Back    Number of Test 59    1. Control-Buffer 50% Glycerol Negative    2. Control-Histamine 1 mg/ml Negative    3. Albumin saline Negative     4. [place] Negative    5. [place] Negative    6. Johnson Negative    7. [place] Blue Negative    8. Meadow Fescue Negative    9. Perennial Rye Negative    10. Sweet Vernal Negative    11. Timothy Negative    12. Cocklebur Negative    13. Burweed Marshelder Negative    14. Ragweed, short Negative    15. Ragweed, Giant Negative    16. Plantain,  English Negative    17. Lamb's Quarters Negative    18. Sheep Sorrell Negative    19. Rough Pigweed Negative    20. Marsh Elder, Rough Negative    21. Mugwort, Common Negative    22. Ash mix Negative    23. Birch mix Negative    24. Beech American Negative    25. Box, Elder Negative    26. Cedar, red Negative    27. Cottonwood, [place] Negative    28. Elm mix Negative    29. Hickory Negative    30. Maple mix Negative    31. Oak, [place] mix Negative    32. Pecan Pollen Negative    33. Pine mix Negative    34. Sycamore Eastern Negative    35. [place], Black Pollen Negative    36. Alternaria alternata Negative    37. Cladosporium Herbarum Negative    38. Aspergillus mix Negative    39. Penicillium mix Negative    40. Bipolaris sorokiniana (Helminthosporium) Negative    41. Drechslera spicifera (Curvularia) Negative    42. Mucor plumbeus Negative    43. Fusarium moniliforme Negative    44. Aureobasidium pullulans (pullulara) Negative    45. Rhizopus oryzae Negative    46. Botrytis cinera Negative    47. Epicoccum nigrum Negative    48. Phoma betae Negative    49. Candida Albicans Negative    50. Trichophyton mentagrophytes Negative    51. Mite, D Farinae  5,000 AU/ml Negative    52. Mite, D Pteronyssinus  5,000 AU/ml Negative    53. Cat Hair 10,000 BAU/ml Negative    54.  Dog Epithelia Negative    55. Mixed Feathers Negative    56. Horse Epithelia Negative    57. Cockroach, German Negative    58. Mouse Negative    59. Roger Leaf Negative    Comments Dermagraphic           Allergy testing results were read and  interpreted by myself, documented by clinical staff.         Salvatore Marvel, MD [facility]

## 2021-05-05 ENCOUNTER — Telehealth: Payer: Self-pay | Admitting: Allergy & Immunology

## 2021-05-05 ENCOUNTER — Other Ambulatory Visit: Payer: Self-pay

## 2021-05-05 MED ORDER — ALBUTEROL SULFATE HFA 108 (90 BASE) MCG/ACT IN AERS: INHALATION_SPRAY | RESPIRATORY_TRACT | 1 refills | Status: AC

## 2021-05-05 NOTE — Telephone Encounter (Signed)
Patient states [facility] does not have azelastine and they cannot order it. Patient states the [facility] in [place] "may be able to order it". Patient would like to know if an alternative could be called in or if we could find a local pharmacy that has it. Also, patient states he is down to 60 puffs of his albuterol inhaler and would like a new prescription sent to [facility] since Dr. Ernst Bowler advised him to continue the albuterol.  Please advise.

## 2021-05-05 NOTE — Telephone Encounter (Signed)
Please advise to change in nasal spray as azelatine is on backorder. Sent in albuterol refill

## 2021-05-06 MED ORDER — OLOPATADINE HCL 0.6 % NA SOLN: 2.0000 | Freq: Two times a day (BID) | NASAL | 5 refills | Status: AC

## 2021-05-06 NOTE — Telephone Encounter (Signed)
Lets change to Patanase 2 sprays per nostril twice daily.  Salvatore Marvel, MD [facility]

## 2021-05-06 NOTE — Telephone Encounter (Signed)
Prescription has been sent to the pharmacy, patient has been informed and verbalized understanding.

## 2021-05-11 ENCOUNTER — Encounter: Payer: Self-pay | Admitting: Allergy & Immunology

## 2021-05-11 LAB — ALLERGENS W/COMP RFLX AREA 2
Alternaria Alternata IgE: <0.1 kU/L
Aspergillus Fumigatus IgE: <0.1 kU/L
Bermuda Grass IgE: <0.1 kU/L
Cedar, Mountain IgE: <0.1 kU/L
Cladosporium Herbarum IgE: <0.1 kU/L
Cockroach, German IgE: <0.1 kU/L
Common Silver Birch IgE: <0.1 kU/L
Cottonwood IgE: <0.1 kU/L
D Farinae IgE: <0.1 kU/L
D Pteronyssinus IgE: <0.1 kU/L
E001-IgE Cat Dander: <0.1 kU/L
E005-IgE Dog Dander: <0.1 kU/L
Elm, American IgE: <0.1 kU/L
IgE (Immunoglobulin E), Serum: 328 IU/mL (ref 6–495)
Johnson Grass IgE: <0.1 kU/L
Maple/Box Elder IgE: <0.1 kU/L
Mouse Urine IgE: <0.1 kU/L
Oak, White IgE: <0.1 kU/L
Pecan, Hickory IgE: <0.1 kU/L
Penicillium Chrysogen IgE: <0.1 kU/L
Pigweed, Rough IgE: <0.1 kU/L
Ragweed, Short IgE: <0.1 kU/L
Sheep Sorrel IgE Qn: <0.1 kU/L
Timothy Grass IgE: <0.1 kU/L
White Mulberry IgE: <0.1 kU/L

## 2021-05-27 ENCOUNTER — Ambulatory Visit: Payer: No Typology Code available for payment source | Admitting: Allergy & Immunology

## 2021-05-28 ENCOUNTER — Encounter: Payer: Self-pay | Admitting: Internal Medicine

## 2021-09-09 ENCOUNTER — Other Ambulatory Visit: Payer: Self-pay | Admitting: Internal Medicine

## 2021-09-09 DIAGNOSIS — F17200 Nicotine dependence, unspecified, uncomplicated: Secondary | ICD-10-CM

## 2022-09-23 ENCOUNTER — Other Ambulatory Visit: Payer: Self-pay | Admitting: Internal Medicine

## 2022-09-23 DIAGNOSIS — F17209 Nicotine dependence, unspecified, with unspecified nicotine-induced disorders: Secondary | ICD-10-CM

## 2022-10-25 ENCOUNTER — Encounter: Payer: Self-pay | Admitting: Internal Medicine

## 2022-11-15 ENCOUNTER — Ambulatory Visit
Admission: RE | Admit: 2022-11-15 | Discharge: 2022-11-15 | Disposition: A | Discharge status: Home / Self Care | Payer: No Typology Code available for payment source | Source: Ambulatory Visit | Attending: Internal Medicine | Admitting: Internal Medicine

## 2022-11-15 DIAGNOSIS — F17209 Nicotine dependence, unspecified, with unspecified nicotine-induced disorders: Secondary | ICD-10-CM

## 2022-12-06 ENCOUNTER — Ambulatory Visit (AMBULATORY_SURGERY_CENTER): Payer: No Typology Code available for payment source

## 2022-12-06 VITALS — Ht 71.0 in | Wt 185.0 lb

## 2022-12-06 DIAGNOSIS — Z86010 Personal history of colonic polyps: Secondary | ICD-10-CM

## 2022-12-06 NOTE — Progress Notes (Signed)

## 2022-12-07 ENCOUNTER — Ambulatory Visit
Admission: EM | Admit: 2022-12-07 | Discharge: 2022-12-07 | Disposition: A | Discharge status: Home / Self Care | Payer: No Typology Code available for payment source | Attending: Family Medicine | Admitting: Family Medicine

## 2022-12-07 DIAGNOSIS — Z23 Encounter for immunization: Secondary | ICD-10-CM

## 2022-12-07 DIAGNOSIS — S61216A Laceration without foreign body of right little finger without damage to nail, initial encounter: Secondary | ICD-10-CM | POA: Diagnosis not present

## 2022-12-07 MED ORDER — MUPIROCIN 2 % EX OINT: 1.0000 | TOPICAL_OINTMENT | Freq: Two times a day (BID) | CUTANEOUS | 0 refills | Status: AC

## 2022-12-07 MED ORDER — CHLORHEXIDINE GLUCONATE 4 % EX LIQD: Freq: Every day | CUTANEOUS | 0 refills | Status: DC | PRN

## 2022-12-07 MED ORDER — TETANUS-DIPHTH-ACELL PERTUSSIS 5-2.5-18.5 LF-MCG/0.5 IM SUSY
0.5000 mL | PREFILLED_SYRINGE | Freq: Once | INTRAMUSCULAR | Status: AC
  Administered 2022-12-07: 0.5 mL via INTRAMUSCULAR

## 2022-12-07 NOTE — ED Provider Notes (Signed)
[facility]    CSN: [id number] Arrival date & time: 12/07/22  1229      History   Chief Complaint Chief Complaint  Patient presents with   Laceration    HPI Roger Ayers is a 56 y.o. male.   Presenting today with a laceration to the right pinky finger that occurred last night while he was in his garage.  He thinks he cut it on a piece of metal.  He immediately wrapped up the wound and has not done anything with it since.  He states he is having significant pain and throbbing but no numbness, tingling, loss of range of motion.  He thinks his tetanus shot was within the last 10 years but unsure if it is within 5.   Past Medical History:  Diagnosis Date   Allergy    Arthritis    Atypical chest pain 11/30/2018   Depression    Essential hypertension 11/30/2018   GERD (gastroesophageal reflux disease)    Hx of adenomatous colonic polyps 02/21/2018   Hypertension    MRSA infection    right leg   Obesity (BMI 35.0-39.9 without comorbidity) 11/30/2018   Sleep apnea    cpap   Tobacco abuse 11/30/2018    Patient Active Problem List   Diagnosis Date Noted   Atypical chest pain 11/30/2018   GERD (gastroesophageal reflux disease) 11/30/2018   Essential hypertension 11/30/2018   Tobacco abuse 11/30/2018   Obesity (BMI 35.0-39.9 without comorbidity) 11/30/2018   Hx of adenomatous colonic polyps 02/21/2018   Traumatic arthritis of right knee 12/24/2014   Status post total right knee replacement 12/24/2014   Retained orthopedic hardware right tibia; previous tibial plateau fracture 03/08/2014   S/P right knee arthroscopy 03/08/2014   Infection of right tibia post ORIF tibial plateau 12/05/2013   Tibial plateau fracture 11/17/2013   Past Surgical History:  Procedure Laterality Date   EXTERNAL FIXATION LEG Right 11/17/2013   Procedure: EXTERNAL FIXATION LEG;  Surgeon: Marianna Payment, MD;  Location: [facility];  Service: Orthopedics;  Laterality: Right;   EXTERNAL  FIXATION REMOVAL Right 11/23/2013   Procedure: REMOVAL EXTERNAL FIXATION RIGHT LEG;  Surgeon: Mcarthur Rossetti, MD;  Location: [facility];  Service: Orthopedics;  Laterality: Right;   HARDWARE REMOVAL Right 03/08/2014   Procedure: Removal plate and screws right tibia,;  Surgeon: Mcarthur Rossetti, MD;  Location: [facility];  Service: Orthopedics;  Laterality: Right;   I & D EXTREMITY Right 12/05/2013   Procedure: IRRIGATION AND DEBRIDEMENT RIGHT LEG;  Surgeon: Mcarthur Rossetti, MD;  Location: [place];  Service: Orthopedics;  Laterality: Right;   I & D EXTREMITY Right 12/08/2013   Procedure: Repeat IRRIGATION AND DEBRIDEMENT Right Leg with Stimulan bead placement;  Surgeon: Mcarthur Rossetti, MD;  Location: [place];  Service: Orthopedics;  Laterality: Right;   KNEE ARTHROSCOPY Right 03/08/2014   Procedure:  arthroscopy RIGHT KNEE WITH EXTENSIVE DEBRIDEMENT;  Surgeon: Mcarthur Rossetti, MD;  Location: [facility];  Service: Orthopedics;  Laterality: Right;   MOUTH SURGERY  2014   bone grafts for implants   ORIF TIBIA PLATEAU Right 11/23/2013   Procedure: OPEN REDUCTION INTERNAL FIXATION (ORIF) RIGHT TIBIAL PLATEAU;  Surgeon: Mcarthur Rossetti, MD;  Location: [facility];  Service: Orthopedics;  Laterality: Right;   TOTAL KNEE ARTHROPLASTY Right 12/24/2014   dr Ninfa Linden   TOTAL KNEE ARTHROPLASTY Right 12/24/2014   Procedure: RIGHT TOTAL KNEE ARTHROPLASTY;  Surgeon: Mcarthur Rossetti, MD;  Location: [place];  Service:  Orthopedics;  Laterality: Right;   TRIGGER FINGER RELEASE Right ~2013   middle finger     Home Medications    Prior to Admission medications   Medication Sig Start Date End Date Taking? Authorizing Provider  chlorhexidine (HIBICLENS) 4 % external liquid Apply topically daily as needed. 12/07/22  Yes Volney American, PA-C  mupirocin ointment (BACTROBAN) 2 % Apply 1 Application topically 2 (two) times daily. 12/07/22  Yes Volney American, PA-C  acyclovir  (ZOVIRAX) 400 MG tablet Take by mouth. Patient not taking: Reported on 12/06/2022 04/15/21   [provider]  albuterol (VENTOLIN HFA) 108 (90 Base) MCG/ACT inhaler SMARTSIG:2 Puff(s) By Mouth Every 4-6 Hours PRN Patient not taking: Reported on 12/06/2022 05/05/21   Valentina Shaggy, MD  Azelastine HCl 0.15 % SOLN Place 2 sprays into both nostrils 2 (two) times daily. Patient not taking: Reported on 12/06/2022 04/29/21   Valentina Shaggy, MD  fluticasone [facility]) 50 MCG/ACT nasal spray PLACE 2 SPRAYS INTO EACH NOSTRIL QD Patient not taking: Reported on 12/06/2022 02/02/18   [provider]  ibuprofen (ADVIL) 800 MG tablet Take by mouth. Patient not taking: Reported on 12/06/2022 03/16/21   [provider]  Loratadine 10 MG CAPS Take 1 capsule by mouth daily. Patient not taking: Reported on 12/06/2022    [provider]  montelukast (SINGULAIR) 10 MG tablet Take 1 tablet (10 mg total) by mouth daily. Patient not taking: Reported on 12/06/2022 04/29/21   Valentina Shaggy, MD  Multiple Vitamin (MULTIVITAMIN WITH MINERALS) TABS tablet Take 1 tablet by mouth daily.    [provider]  olmesartan-hydrochlorothiazide (BENICAR HCT) 40-25 MG tablet  01/09/18   [provider]  Olopatadine HCl (PATANASE) 0.6 % SOLN Place 2 sprays into the nose in the morning and at bedtime. Patient not taking: Reported on 12/06/2022 05/06/21   Valentina Shaggy, MD  omeprazole (PRILOSEC) 20 MG capsule Take 20 mg by mouth every morning.    [provider]  pramipexole (MIRAPEX) 0.5 MG tablet  01/02/18   [provider]  rosuvastatin (CRESTOR) 10 MG tablet 1 tablet Orally at bedtime for 90 days 11/30/22   [provider]    Family History Family History  Problem Relation Age of Onset   Stroke Father        9s   Stroke Paternal Grandfather    Colon cancer Neg Hx    Colon polyps Neg Hx    Esophageal cancer Neg Hx    Rectal  cancer Neg Hx    Stomach cancer Neg Hx     Social History Social History   Tobacco Use   Smoking status: Every Day    Packs/day: 0.50    Years: 20.00    Total pack years: 10.00    Types: Cigarettes, E-cigarettes   Smokeless tobacco: Never   Tobacco comments:    e-cigs  Substance Use Topics   Alcohol use: Yes    Comment: occasionally   Drug use: No     Allergies   Thimerosal (thiomersal)   Review of Systems Review of Systems PER HPI  Physical Exam Triage Vital Signs ED Triage Vitals  Enc Vitals Group     BP 12/07/22 1338 111/79     Pulse Rate 12/07/22 1338 68     Resp 12/07/22 1338 18     Temp 12/07/22 1338 97.9 F (36.6 C)     Temp Source 12/07/22 1338 Oral     SpO2 12/07/22 1338 98 %  Weight --      Height --      Head Circumference --      Peak Flow --      Pain Score 12/07/22 1342 8     Pain Loc --      Pain Edu? --      Excl. in [place]? --    No data found.  Updated Vital Signs BP 111/79 (BP Location: Right Arm)   Pulse 68   Temp 97.9 F (36.6 C) (Oral)   Resp 18   SpO2 98%   Visual Acuity Right Eye Distance:   Left Eye Distance:   Bilateral Distance:    Right Eye Near:   Left Eye Near:    Bilateral Near:     Physical Exam Vitals and nursing note reviewed.  Constitutional:      Appearance: Normal appearance.  HENT:     Head: Atraumatic.  Eyes:     Extraocular Movements: Extraocular movements intact.     Conjunctiva/sclera: Conjunctivae normal.  Cardiovascular:     Rate and Rhythm: Normal rate and regular rhythm.  Pulmonary:     Effort: Pulmonary effort is normal.     Breath sounds: Normal breath sounds.  Musculoskeletal:        General: Normal range of motion.     Cervical back: Normal range of motion and neck supple.  Skin:    General: Skin is warm and dry.     Comments: V-shaped superficial flap type laceration to the palmar aspect of the right distal fifth finger.  Bleeding well-controlled, no foreign body noted on  extensive exploration of wound.  Wound edges fairly well-approximated at rest  Neurological:     General: No focal deficit present.     Mental Status: He is oriented to person, place, and time.     Comments: Right upper extremity neurovascularly intact  Psychiatric:        Mood and Affect: Mood normal.        Thought Content: Thought content normal.        Judgment: Judgment normal.    UC Treatments / Results  Labs (all labs ordered are listed, but only abnormal results are displayed) Labs Reviewed - No data to display  EKG  Radiology No results found.  Procedures Laceration Repair  Date/Time: 12/07/2022 3:18 PM  Performed by: Volney American, PA-C Authorized by: Volney American, PA-C   Consent:    Consent obtained:  Verbal   Consent given by:  Patient   Risks, benefits, and alternatives were discussed: yes     Risks discussed:  Infection, pain, poor cosmetic result and need for additional repair   Alternatives discussed: Sutures. Universal protocol:    Procedure explained and questions answered to patient or proxy's satisfaction: yes     Relevant documents present and verified: yes     Patient identity confirmed:  Verbally with patient and arm band Anesthesia:    Anesthesia method:  None Laceration details:    Location:  Finger   Finger location:  R small finger   Length (cm):  1.5   Depth (mm):  2 Pre-procedure details:    Preparation:  Patient was prepped and draped in usual sterile fashion Exploration:    Hemostasis achieved with:  Direct pressure   Wound exploration: wound explored through full range of motion     Contaminated: no   Treatment:    Area cleansed with:  Chlorhexidine   Amount of cleaning:  Standard  Visualized foreign bodies/material removed: no   Skin repair:    Repair method:  Tissue adhesive Approximation:    Approximation:  Close Repair type:    Repair type:  Simple Post-procedure details:    Dressing:  Non-adherent  dressing and splint for protection   Procedure completion:  Tolerated well, no immediate complications  (including critical care time)  Medications Ordered in UC Medications  Tdap (BOOSTRIX) injection 0.5 mL (0.5 mLs Intramuscular Given 12/07/22 1424)    Initial Impression / Assessment and Plan / UC Course  I have reviewed the triage vital signs and the nursing notes.  Pertinent labs & imaging results that were available during my care of the patient were reviewed by me and considered in my medical decision making (see chart for details).     Wound cleaned extensively, Dermabond applied for closure and placed in a dressing and splint for protection.  Discussed wound care procedure and return precautions  Final Clinical Impressions(s) / UC Diagnoses   Final diagnoses:  Laceration of right little finger without foreign body without damage to nail, initial encounter     Discharge Instructions      Clean the area with Hibiclens and apply mupirocin ointment prior to dressing once a day to twice a day after the glue peels off.  Keep the finger splint on or at minimum Coban wrap to help keep the finger in a neutral position for the next 7 to 10 days until the wound has healed    ED Prescriptions     Medication Sig Dispense Auth. Provider   chlorhexidine (HIBICLENS) 4 % external liquid Apply topically daily as needed. 120 mL Volney American, PA-C   mupirocin ointment (BACTROBAN) 2 % Apply 1 Application topically 2 (two) times daily. 22 g Volney American, [place]      PDMP not reviewed this encounter.   Volney American, [place] 12/07/22 1524

## 2022-12-07 NOTE — Discharge Instructions (Signed)
Clean the area with Hibiclens and apply mupirocin ointment prior to dressing once a day to twice a day after the glue peels off.  Keep the finger splint on or at minimum Coban wrap to help keep the finger in a neutral position for the next 7 to 10 days until the wound has healed

## 2022-12-07 NOTE — ED Triage Notes (Signed)
Pt reports he cut the right pinky finger with a metal piece last night when he tripped and fell in his garage.

## 2022-12-07 NOTE — ED Notes (Signed)
Nonadherent dressing applied to site, finger splint applied on top of pad, and reinforced with coban. Pt tolerated well.  Site management and infection prevention education provided.

## 2023-01-05 ENCOUNTER — Encounter: Payer: Self-pay | Admitting: Internal Medicine

## 2023-01-07 ENCOUNTER — Ambulatory Visit (AMBULATORY_SURGERY_CENTER): Payer: No Typology Code available for payment source | Admitting: Internal Medicine

## 2023-01-07 ENCOUNTER — Encounter: Payer: Self-pay | Admitting: Internal Medicine

## 2023-01-07 VITALS — BP 103/53 | HR 68 | Temp 98.0°F | Resp 11 | Ht 71.0 in | Wt 177.0 lb

## 2023-01-07 DIAGNOSIS — Z09 Encounter for follow-up examination after completed treatment for conditions other than malignant neoplasm: Secondary | ICD-10-CM

## 2023-01-07 DIAGNOSIS — D122 Benign neoplasm of ascending colon: Secondary | ICD-10-CM

## 2023-01-07 DIAGNOSIS — D124 Benign neoplasm of descending colon: Secondary | ICD-10-CM | POA: Diagnosis not present

## 2023-01-07 DIAGNOSIS — Z86010 Personal history of colonic polyps: Secondary | ICD-10-CM

## 2023-01-07 DIAGNOSIS — D123 Benign neoplasm of transverse colon: Secondary | ICD-10-CM

## 2023-01-07 MED ORDER — SODIUM CHLORIDE 0.9 % IV SOLN: 500.0000 mL | Freq: Once | INTRAVENOUS | Status: DC

## 2023-01-07 NOTE — Progress Notes (Signed)
[facility] History and Physical   Primary Care Physician:  Ginger Organ., MD   Reason for Procedure:   Hx colon polyps  Plan:    colonoscopy     HPI: Roger Ayers is a 57 y.o. male s/p polyp removal as below  2019 4 adenomas max 10 mm Past Medical History:  Diagnosis Date   Allergy    Arthritis    Atypical chest pain 11/30/2018   Depression    Essential hypertension 11/30/2018   GERD (gastroesophageal reflux disease)    Hx of adenomatous colonic polyps 02/21/2018   Hypertension    MRSA infection    right leg   Obesity (BMI 35.0-39.9 without comorbidity) 11/30/2018   Sleep apnea    cpap   Tobacco abuse 11/30/2018    Past Surgical History:  Procedure Laterality Date   EXTERNAL FIXATION LEG Right 11/17/2013   Procedure: EXTERNAL FIXATION LEG;  Surgeon: Marianna Payment, MD;  Location: [facility];  Service: Orthopedics;  Laterality: Right;   EXTERNAL FIXATION REMOVAL Right 11/23/2013   Procedure: REMOVAL EXTERNAL FIXATION RIGHT LEG;  Surgeon: Mcarthur Rossetti, MD;  Location: [facility];  Service: Orthopedics;  Laterality: Right;   HARDWARE REMOVAL Right 03/08/2014   Procedure: Removal plate and screws right tibia,;  Surgeon: Mcarthur Rossetti, MD;  Location: [facility];  Service: Orthopedics;  Laterality: Right;   I & D EXTREMITY Right 12/05/2013   Procedure: IRRIGATION AND DEBRIDEMENT RIGHT LEG;  Surgeon: Mcarthur Rossetti, MD;  Location: [place];  Service: Orthopedics;  Laterality: Right;   I & D EXTREMITY Right 12/08/2013   Procedure: Repeat IRRIGATION AND DEBRIDEMENT Right Leg with Stimulan bead placement;  Surgeon: Mcarthur Rossetti, MD;  Location: [place];  Service: Orthopedics;  Laterality: Right;   KNEE ARTHROSCOPY Right 03/08/2014   Procedure:  arthroscopy RIGHT KNEE WITH EXTENSIVE DEBRIDEMENT;  Surgeon: Mcarthur Rossetti, MD;  Location: [facility];  Service: Orthopedics;  Laterality: Right;   MOUTH SURGERY  2014   bone grafts for  implants   ORIF TIBIA PLATEAU Right 11/23/2013   Procedure: OPEN REDUCTION INTERNAL FIXATION (ORIF) RIGHT TIBIAL PLATEAU;  Surgeon: Mcarthur Rossetti, MD;  Location: [facility];  Service: Orthopedics;  Laterality: Right;   TOTAL KNEE ARTHROPLASTY Right 12/24/2014   dr Ninfa Linden   TOTAL KNEE ARTHROPLASTY Right 12/24/2014   Procedure: RIGHT TOTAL KNEE ARTHROPLASTY;  Surgeon: Mcarthur Rossetti, MD;  Location: [place];  Service: Orthopedics;  Laterality: Right;   TRIGGER FINGER RELEASE Right ~2013   middle finger    Prior to Admission medications   Medication Sig Start Date End Date Taking? Authorizing Provider  albuterol (VENTOLIN HFA) 108 (90 Base) MCG/ACT inhaler SMARTSIG:2 Puff(s) By Mouth Every 4-6 Hours PRN 05/05/21  Yes Valentina Shaggy, MD  Azelastine HCl 0.15 % SOLN Place 2 sprays into both nostrils 2 (two) times daily. 04/29/21  Yes Valentina Shaggy, MD  fluticasone [facility]) 50 MCG/ACT nasal spray  02/02/18  Yes [provider]  olmesartan-hydrochlorothiazide (BENICAR HCT) 40-25 MG tablet  01/09/18  Yes [provider]  omeprazole (PRILOSEC) 20 MG capsule Take 20 mg by mouth every morning.   Yes [provider]  rosuvastatin (CRESTOR) 10 MG tablet 1 tablet Orally at bedtime for 90 days 11/30/22  Yes [provider]  acyclovir (ZOVIRAX) 400 MG tablet Take by mouth. Patient not taking: Reported on 12/06/2022 04/15/21   [provider]  ibuprofen (ADVIL) 800 MG tablet Take by mouth. Patient not taking:  Reported on 12/06/2022 03/16/21   [provider]  Loratadine 10 MG CAPS Take 1 capsule by mouth daily. Patient not taking: Reported on 12/06/2022    [provider]  Multiple Vitamin (MULTIVITAMIN WITH MINERALS) TABS tablet Take 1 tablet by mouth daily.    [provider]  mupirocin ointment (BACTROBAN) 2 % Apply 1 Application topically 2 (two) times daily. 12/07/22   Volney American, PA-C  Olopatadine  HCl (PATANASE) 0.6 % SOLN Place 2 sprays into the nose in the morning and at bedtime. Patient not taking: Reported on 12/06/2022 05/06/21   Valentina Shaggy, MD  pramipexole (MIRAPEX) 0.5 MG tablet  01/02/18   [provider]    Current Outpatient Medications  Medication Sig Dispense Refill   albuterol (VENTOLIN HFA) 108 (90 Base) MCG/ACT inhaler SMARTSIG:2 Puff(s) By Mouth Every 4-6 Hours PRN 8 g 1   Azelastine HCl 0.15 % SOLN Place 2 sprays into both nostrils 2 (two) times daily. 30 mL 5   fluticasone (FLONASE) 50 MCG/ACT nasal spray   0   olmesartan-hydrochlorothiazide (BENICAR HCT) 40-25 MG tablet      omeprazole (PRILOSEC) 20 MG capsule Take 20 mg by mouth every morning.     rosuvastatin (CRESTOR) 10 MG tablet 1 tablet Orally at bedtime for 90 days     acyclovir (ZOVIRAX) 400 MG tablet Take by mouth. (Patient not taking: Reported on 12/06/2022)     ibuprofen (ADVIL) 800 MG tablet Take by mouth. (Patient not taking: Reported on 12/06/2022)     Loratadine 10 MG CAPS Take 1 capsule by mouth daily. (Patient not taking: Reported on 12/06/2022)     Multiple Vitamin (MULTIVITAMIN WITH MINERALS) TABS tablet Take 1 tablet by mouth daily.     mupirocin ointment (BACTROBAN) 2 % Apply 1 Application topically 2 (two) times daily. 22 g 0   Olopatadine HCl (PATANASE) 0.6 % SOLN Place 2 sprays into the nose in the morning and at bedtime. (Patient not taking: Reported on 12/06/2022) 30.5 g 5   pramipexole (MIRAPEX) 0.5 MG tablet  (Patient not taking: Reported on 12/06/2022)     Current Facility-Administered Medications  Medication Dose Route Frequency Provider Last Rate Last Admin   0.9 %  sodium chloride infusion  500 mL Intravenous Once Gatha Mayer, MD        Allergies as of 01/07/2023 - Review Complete 01/07/2023  Allergen Reaction Noted   Thimerosal (thiomersal)  12/05/2013    Family History  Problem Relation Age of Onset   Stroke Father        25s   Stroke Paternal  Grandfather    Colon cancer Neg Hx    Colon polyps Neg Hx    Esophageal cancer Neg Hx    Rectal cancer Neg Hx    Stomach cancer Neg Hx     Social History   Socioeconomic History   Marital status: Married    Spouse name: Not on file   Number of children: Not on file   Years of education: Not on file   Highest education level: Not on file  Occupational History   Not on file  Tobacco Use   Smoking status: Every Day    Packs/day: 0.50    Years: 20.00    Total pack years: 10.00    Types: Cigarettes, E-cigarettes   Smokeless tobacco: Never   Tobacco comments:    e-cigs  Vaping Use   Vaping Use: Some days  Substance and Sexual Activity   Alcohol  use: Yes    Comment: occasionally   Drug use: No   Sexual activity: Yes  Other Topics Concern   Not on file  Social History Narrative   Not on file   Social Determinants of Health   Financial Resource Strain: Not on file  Food Insecurity: Not on file  Transportation Needs: Not on file  Physical Activity: Not on file  Stress: Not on file  Social Connections: Not on file  Intimate Partner Violence: Not on file    Review of Systems:  All other review of systems negative except as mentioned in the HPI.  Physical Exam: Vital signs BP 104/76   Pulse 70   Temp 98 F (36.7 C)   Resp 14   Ht '5\' 11"'$  (1.803 m)   Wt 177 lb (80.3 kg)   SpO2 99%   BMI 24.69 kg/m   General:   Alert,  Well-developed, well-nourished, pleasant and cooperative in NAD Lungs:  Clear throughout to auscultation.   Heart:  Regular rate and rhythm; no murmurs, clicks, rubs,  or gallops. Abdomen:  Soft, nontender and nondistended. Normal bowel sounds.   Neuro/Psych:  Alert and cooperative. Normal mood and affect. A and O x 3   '@Kazuma Elena'$  Simonne Maffucci, MD, [facility] [phone] (pager) 01/07/2023 10:32 AM@

## 2023-01-07 NOTE — Op Note (Signed)
[place] Patient Name: Brandun Pinn Procedure Date: 01/07/2023 10:26 AM MRN: [id number] Endoscopist: Gatha Mayer , MD, [id number] Age: 57 Referring MD:  Date of Birth: 04-23-1966 Gender: Male Account #: [id number] Procedure:                Colonoscopy Indications:              Surveillance: Personal history of adenomatous                            polyps on last colonoscopy > 3 years ago, Last                            colonoscopy: 2019 Medicines:                Monitored Anesthesia Care Procedure:                Pre-Anesthesia Assessment:                           - Prior to the procedure, a History and Physical                            was performed, and patient medications and                            allergies were reviewed. The patient's tolerance of                            previous anesthesia was also reviewed. The risks                            and benefits of the procedure and the sedation                            options and risks were discussed with the patient.                            All questions were answered, and informed consent                            was obtained. Prior Anticoagulants: The patient has                            taken no anticoagulant or antiplatelet agents. ASA                            Grade Assessment: II - A patient with mild systemic                            disease. After reviewing the risks and benefits,                            the patient was deemed in satisfactory condition to  undergo the procedure.                           After obtaining informed consent, the colonoscope                            was passed under direct vision. Throughout the                            procedure, the patient's blood pressure, pulse, and                            oxygen saturations were monitored continuously. The                            CF [id number] #[id number] was introduced through the  anus                            and advanced to the the cecum, identified by                            appendiceal orifice and ileocecal valve. The                            colonoscopy was performed without difficulty. The                            patient tolerated the procedure well. The quality                            of the bowel preparation was good. The ileocecal                            valve, appendiceal orifice, and rectum were                            photographed. The bowel preparation used was                            Miralax via split dose instruction. Scope In: 10:48:32 AM Scope Out: 11:01:08 AM Scope Withdrawal Time: 0 hours 10 minutes 39 seconds  Total Procedure Duration: 0 hours 12 minutes 36 seconds  Findings:                 The perianal and digital rectal examinations were                            normal. Pertinent negatives include normal prostate                            (size, shape, and consistency).                           Four sessile polyps were found in the descending  colon, transverse colon and ascending colon. The                            polyps were 2 to 6 mm in size. These polyps were                            removed with a cold snare. Resection and retrieval                            were complete. Verification of patient                            identification for the specimen was done. Estimated                            blood loss was minimal.                           Multiple diverticula were found in the sigmoid                            colon and transverse colon.                           External and internal hemorrhoids were found.                           The exam was otherwise without abnormality on                            direct and retroflexion views. Complications:            No immediate complications. Estimated Blood Loss:     Estimated blood loss was minimal. Impression:                - Four 2 to 6 mm polyps in the descending colon, in                            the transverse colon and in the ascending colon,                            removed with a cold snare. Resected and retrieved.                           - Diverticulosis in the sigmoid colon and in the                            transverse colon.                           - External and internal hemorrhoids.                           - The examination was otherwise normal on direct  and retroflexion views.                           - Personal history of colonic polyps. 4 adenomas                            max 10 mm 2019 Recommendation:           - Patient has a contact number available for                            emergencies. The signs and symptoms of potential                            delayed complications were discussed with the                            patient. Return to normal activities tomorrow.                            Written discharge instructions were provided to the                            patient.                           - Resume previous diet.                           - Continue present medications.                           - Repeat colonoscopy is recommended for                            surveillance. The colonoscopy date will be                            determined after pathology results from today's                            exam become available for review. Gatha Mayer, MD 01/07/2023 11:09:09 AM This report has been signed electronically.

## 2023-01-07 NOTE — Progress Notes (Signed)
Called to room to assist during endoscopic procedure.  Patient ID and intended procedure confirmed with present staff. Received instructions for my participation in the procedure from the performing physician.  

## 2023-01-07 NOTE — Patient Instructions (Addendum)
Four polyps again today - removed and all look benign. I will let you know pathology results and when to have another routine colonoscopy by mail and/or My Chart.  You also have a condition called diverticulosis - common and not usually a problem. Please read the handout provided. Hemorrhoids were swollen some and we often see that after a colonoscopy prep. If you have hemorrhoid problems (swelling, itching, bleeding) I am able to treat those with an in-office procedure. If you like, please call my office at [phone] to schedule an appointment and I can evaluate you further.   I appreciate the opportunity to care for you. Gatha Mayer, MD, FACG  YOU HAD AN ENDOSCOPIC PROCEDURE TODAY AT [facility]:   Refer to the procedure report that was given to you for any specific questions about what was found during the examination.  If the procedure report does not answer your questions, please call your gastroenterologist to clarify.  If you requested that your care partner not be given the details of your procedure findings, then the procedure report has been included in a sealed envelope for you to review at your convenience later.  YOU SHOULD EXPECT: Some feelings of bloating in the abdomen. Passage of more gas than usual.  Walking can help get rid of the air that was put into your GI tract during the procedure and reduce the bloating. If you had a lower endoscopy (such as a colonoscopy or flexible sigmoidoscopy) you may notice spotting of blood in your stool or on the toilet paper. If you underwent a bowel prep for your procedure, you may not have a normal bowel movement for a few days.  Please Note:  You might notice some irritation and congestion in your nose or some drainage.  This is from the oxygen used during your procedure.  There is no need for concern and it should clear up in a day or so.  SYMPTOMS TO REPORT IMMEDIATELY:  Following lower endoscopy (colonoscopy or flexible  sigmoidoscopy):  Excessive amounts of blood in the stool  Significant tenderness or worsening of abdominal pains  Swelling of the abdomen that is new, acute  Fever of 100F or higher  For urgent or emergent issues, a gastroenterologist can be reached at any hour by calling [phone]. Do not use MyChart messaging for urgent concerns.    DIET:  We do recommend a small meal at first, but then you may proceed to your regular diet.  Drink plenty of fluids but you should avoid alcoholic beverages for 24 hours.  ACTIVITY:  You should plan to take it easy for the rest of today and you should NOT DRIVE or use heavy machinery until tomorrow (because of the sedation medicines used during the test).    FOLLOW UP: Our staff will call the number listed on your records the next business day following your procedure.  We will call around 7:15- 8:00 am to check on you and address any questions or concerns that you may have regarding the information given to you following your procedure. If we do not reach you, we will leave a message.     If any biopsies were taken you will be contacted by phone or by letter within the next 1-3 weeks.  Please call us at [phone] if you have not heard about the biopsies in 3 weeks.    SIGNATURES/CONFIDENTIALITY: You and/or your care partner have signed paperwork which will be entered into your electronic medical  record.  These signatures attest to the fact that that the information above on your After Visit Summary has been reviewed and is understood.  Full responsibility of the confidentiality of this discharge information lies with you and/or your care-partner.

## 2023-01-07 NOTE — Progress Notes (Signed)
Report to PACU, RN, vss, BBS= Clear.  

## 2023-01-07 NOTE — Progress Notes (Signed)
Pt's states no medical or surgical changes since previsit or office visit. 

## 2023-01-10 ENCOUNTER — Telehealth: Payer: Self-pay | Admitting: *Deleted

## 2023-01-10 NOTE — Telephone Encounter (Signed)
  Follow up Call-     01/07/2023    9:52 AM  Call back number  Post procedure Call Back phone  # [phone]  Permission to leave phone message Yes     Patient questions:  Do you have a fever, pain , or abdominal swelling? No. Pain Score  0 *  Have you tolerated food without any problems? Yes.    Have you been able to return to your normal activities? Yes.    Do you have any questions about your discharge instructions: Diet   No. Medications  No. Follow up visit  No.  Do you have questions or concerns about your Care? No.  Actions: * If pain score is 4 or above: No action needed, pain <4.

## 2023-01-18 ENCOUNTER — Encounter: Payer: Self-pay | Admitting: Internal Medicine

## 2023-01-18 DIAGNOSIS — Z86010 Personal history of colonic polyps: Secondary | ICD-10-CM

## 2023-10-19 ENCOUNTER — Other Ambulatory Visit (HOSPITAL_COMMUNITY): Payer: Self-pay

## 2023-10-19 MED ORDER — SEMAGLUTIDE-WEIGHT MANAGEMENT 0.25 MG/0.5ML ~~LOC~~ SOAJ
0.2500 mg | SUBCUTANEOUS | 0 refills | Status: AC
  Filled 2023-10-19: qty 2, 28d supply, fill #0

## 2023-10-19 MED ORDER — WEGOVY 1 MG/0.5ML ~~LOC~~ SOAJ
1.0000 mg | SUBCUTANEOUS | 1 refills | Status: AC
  Filled 2024-02-01: qty 2, 28d supply, fill #0

## 2023-10-19 MED ORDER — WEGOVY 0.5 MG/0.5ML ~~LOC~~ SOAJ
0.5000 mg | SUBCUTANEOUS | 0 refills | Status: AC
  Filled 2023-10-19 – 2023-12-22 (×2): qty 2, 28d supply, fill #0

## 2023-10-20 ENCOUNTER — Other Ambulatory Visit: Payer: Self-pay | Admitting: Internal Medicine

## 2023-10-20 DIAGNOSIS — F17200 Nicotine dependence, unspecified, uncomplicated: Secondary | ICD-10-CM

## 2023-10-25 ENCOUNTER — Other Ambulatory Visit (HOSPITAL_COMMUNITY): Payer: Self-pay

## 2023-12-22 ENCOUNTER — Other Ambulatory Visit: Payer: Self-pay

## 2023-12-22 ENCOUNTER — Other Ambulatory Visit (HOSPITAL_COMMUNITY): Payer: Self-pay

## 2023-12-23 ENCOUNTER — Other Ambulatory Visit (HOSPITAL_COMMUNITY): Payer: Self-pay

## 2024-01-30 ENCOUNTER — Telehealth: Payer: Self-pay | Admitting: Orthopaedic Surgery

## 2024-01-30 NOTE — Telephone Encounter (Signed)
Patient called advised he is having pain and swelling in his right knee. Patient said it is pretty painful.  Patient asked if he can be worked into Dr. Eliberto Ivory schedule. Patient said he is off Wednesday and Thursday.   The number to contact patient is [phone]

## 2024-02-01 ENCOUNTER — Other Ambulatory Visit (HOSPITAL_COMMUNITY): Payer: Self-pay

## 2024-02-06 ENCOUNTER — Other Ambulatory Visit (INDEPENDENT_AMBULATORY_CARE_PROVIDER_SITE_OTHER): Payer: Self-pay

## 2024-02-06 ENCOUNTER — Ambulatory Visit: Payer: No Typology Code available for payment source | Admitting: Physician Assistant

## 2024-02-06 ENCOUNTER — Encounter: Payer: Self-pay | Admitting: Physician Assistant

## 2024-02-06 DIAGNOSIS — G8929 Other chronic pain: Secondary | ICD-10-CM

## 2024-02-06 DIAGNOSIS — M25561 Pain in right knee: Secondary | ICD-10-CM

## 2024-02-06 NOTE — Progress Notes (Signed)
 Office Visit Note   Patient: Roger Ayers           Date of Birth: December 02, 1966           MRN: [id number] Visit Date: 02/06/2024              Requested by: Jeannine Milroy., MD [address] [place],  [place] [address] PCP: Jeannine Milroy., MD   Assessment & Plan: Visit Diagnoses:  1. Chronic pain of right knee     Plan: Given his chronic pain recommend physical therapy to work on strengthening bilateral lower extremities.  Will also have him do quad strengthening exercises as shown.  Follow-up with Dr. Alvester Johnson in 6 weeks see how he is doing overall.  Questions were encouraged and answered.  Follow-Up Instructions: Return in about 6 weeks (around 03/19/2024) for Dr. Lucienne Ryder.   Orders:  Orders Placed This Encounter  Procedures   XR Knee 1-2 Views Right   Ambulatory referral to Physical Therapy   No orders of the defined types were placed in this encounter.     Procedures: No procedures performed   Clinical Data: No additional findings.   Subjective: Chief Complaint  Patient presents with   Right Knee - Pain    HPI Roger Ayers 58 year old male we have not seen in years.  Comes in today due to right knee pain that started a few weeks ago.  History of right total knee arthroplasty 12/24/2014 by Dr. Alvester Johnson.  He states he is having sharp pains laterally.  States he is having some grinding in the knee.  Also notes that his leg feels heavy.  Gives way at times.  No known injury.  Has tried bracing which has helped and also some ibuprofen .  Review of Systems See HPI otherwise negative or noncontributory  Objective: Vital Signs: There were no vitals taken for this visit.  Physical Exam Constitutional:      Appearance: He is not ill-appearing or diaphoretic.  Pulmonary:     Effort: Pulmonary effort is normal.  Neurological:     Mental Status: He is alert and oriented to person, place, and time.  Psychiatric:        Mood and Affect: Mood normal.     Ortho  Exam Bilateral knees: Good range of motion of both knees.  No instability valgus varus stress in either knee.  Atrophy of the quads particularly VMO bilaterally.  No abnormal warmth erythema or effusion of either knee.  Right knee patella tracks slightly laterally. Specialty Comments:  No specialty comments available.  Imaging: XR Knee 1-2 Views Right Result Date: 02/06/2024 Right knee 2 views: Well-seated right total knee arthroplasty components.  No acute fractures or acute bony abnormalities.  Knee is well located.    PMFS History: Patient Active Problem List   Diagnosis Date Noted   Atypical chest pain 11/30/2018   GERD (gastroesophageal reflux disease) 11/30/2018   Essential hypertension 11/30/2018   Tobacco abuse 11/30/2018   Obesity (BMI 35.0-39.9 without comorbidity) 11/30/2018   Hx of adenomatous colonic polyps 02/21/2018   Traumatic arthritis of right knee 12/24/2014   Status post total right knee replacement 12/24/2014   Retained orthopedic hardware right tibia; previous tibial plateau fracture 03/08/2014   S/P right knee arthroscopy 03/08/2014   Infection of right tibia post ORIF tibial plateau 12/05/2013   Tibial plateau fracture 11/17/2013   Past Medical History:  Diagnosis Date   Allergy     Arthritis    Atypical chest pain  11/30/2018   Depression    Essential hypertension 11/30/2018   GERD (gastroesophageal reflux disease)    Hx of adenomatous colonic polyps 02/21/2018   Hypertension    MRSA infection    right leg   Obesity (BMI 35.0-39.9 without comorbidity) 11/30/2018   Sleep apnea    cpap   Tobacco abuse 11/30/2018    Family History  Problem Relation Age of Onset   Stroke Father        58s   Stroke Paternal Grandfather    Colon cancer Neg Hx    Colon polyps Neg Hx    Esophageal cancer Neg Hx    Rectal cancer Neg Hx    Stomach cancer Neg Hx     Past Surgical History:  Procedure Laterality Date   EXTERNAL FIXATION LEG Right 11/17/2013    Procedure: EXTERNAL FIXATION LEG;  Surgeon: Edison Gore, MD;  Location: [facility];  Service: Orthopedics;  Laterality: Right;   EXTERNAL FIXATION REMOVAL Right 11/23/2013   Procedure: REMOVAL EXTERNAL FIXATION RIGHT LEG;  Surgeon: Arnie Lao, MD;  Location: [facility];  Service: Orthopedics;  Laterality: Right;   HARDWARE REMOVAL Right 03/08/2014   Procedure: Removal plate and screws right tibia,;  Surgeon: Arnie Lao, MD;  Location: [facility];  Service: Orthopedics;  Laterality: Right;   I & D EXTREMITY Right 12/05/2013   Procedure: IRRIGATION AND DEBRIDEMENT RIGHT LEG;  Surgeon: Arnie Lao, MD;  Location: [facility] OR;  Service: Orthopedics;  Laterality: Right;   I & D EXTREMITY Right 12/08/2013   Procedure: Repeat IRRIGATION AND DEBRIDEMENT Right Leg with Stimulan bead placement;  Surgeon: Arnie Lao, MD;  Location: [facility] OR;  Service: Orthopedics;  Laterality: Right;   KNEE ARTHROSCOPY Right 03/08/2014   Procedure:  arthroscopy RIGHT KNEE WITH EXTENSIVE DEBRIDEMENT;  Surgeon: Arnie Lao, MD;  Location: [facility];  Service: Orthopedics;  Laterality: Right;   MOUTH SURGERY  2014   bone grafts for implants   ORIF TIBIA PLATEAU Right 11/23/2013   Procedure: OPEN REDUCTION INTERNAL FIXATION (ORIF) RIGHT TIBIAL PLATEAU;  Surgeon: Arnie Lao, MD;  Location: [facility];  Service: Orthopedics;  Laterality: Right;   TOTAL KNEE ARTHROPLASTY Right 12/24/2014   dr Lucienne Ryder   TOTAL KNEE ARTHROPLASTY Right 12/24/2014   Procedure: RIGHT TOTAL KNEE ARTHROPLASTY;  Surgeon: Arnie Lao, MD;  Location: [facility] OR;  Service: Orthopedics;  Laterality: Right;   TRIGGER FINGER RELEASE Right ~2013   middle finger   Social History   Occupational History   Not on file  Tobacco Use   Smoking status: Every Day    Current packs/day: 0.50    Average packs/day: 0.5 packs/day for 20.0 years (10.0 ttl pk-yrs)    Types: Cigarettes, E-cigarettes   Smokeless  tobacco: Never   Tobacco comments:    e-cigs  Vaping Use   Vaping status: Some Days  Substance and Sexual Activity   Alcohol use: Yes    Comment: occasionally   Drug use: No   Sexual activity: Yes

## 2024-03-14 ENCOUNTER — Ambulatory Visit (HOSPITAL_COMMUNITY): Payer: No Typology Code available for payment source | Attending: Physician Assistant | Admitting: Physical Therapy

## 2024-03-14 ENCOUNTER — Other Ambulatory Visit: Payer: Self-pay

## 2024-03-14 DIAGNOSIS — M25561 Pain in right knee: Secondary | ICD-10-CM | POA: Insufficient documentation

## 2024-03-14 DIAGNOSIS — M6281 Muscle weakness (generalized): Secondary | ICD-10-CM

## 2024-03-14 DIAGNOSIS — G8929 Other chronic pain: Secondary | ICD-10-CM

## 2024-03-14 NOTE — Therapy (Signed)
 OUTPATIENT PHYSICAL THERAPY LOWER EXTREMITY EVALUATION   Patient Name: Roger Ayers MRN: [id number] DOB:1966/07/01, 58 y.o., male Today's Date: 03/14/2024  END OF SESSION:  PT End of Session - 03/14/24 1559     Visit Number 1    Number of Visits 8    Date for PT Re-Evaluation 04/11/24    Authorization Type AETNA    PT Start Time 1515    PT Stop Time 1600    PT Time Calculation (min) 45 min    Activity Tolerance Patient tolerated treatment well             Past Medical History:  Diagnosis Date   Allergy    Arthritis    Atypical chest pain 11/30/2018   Depression    Essential hypertension 11/30/2018   GERD (gastroesophageal reflux disease)    Hx of adenomatous colonic polyps 02/21/2018   Hypertension    MRSA infection    right leg   Obesity (BMI 35.0-39.9 without comorbidity) 11/30/2018   Sleep apnea    cpap   Tobacco abuse 11/30/2018   Past Surgical History:  Procedure Laterality Date   EXTERNAL FIXATION LEG Right 11/17/2013   Procedure: EXTERNAL FIXATION LEG;  Surgeon: Cheral Almas, MD;  Location: [facility];  Service: Orthopedics;  Laterality: Right;   EXTERNAL FIXATION REMOVAL Right 11/23/2013   Procedure: REMOVAL EXTERNAL FIXATION RIGHT LEG;  Surgeon: Kathryne Hitch, MD;  Location: [facility];  Service: Orthopedics;  Laterality: Right;   HARDWARE REMOVAL Right 03/08/2014   Procedure: Removal plate and screws right tibia,;  Surgeon: Kathryne Hitch, MD;  Location: [facility];  Service: Orthopedics;  Laterality: Right;   I & D EXTREMITY Right 12/05/2013   Procedure: IRRIGATION AND DEBRIDEMENT RIGHT LEG;  Surgeon: Kathryne Hitch, MD;  Location: [facility] OR;  Service: Orthopedics;  Laterality: Right;   I & D EXTREMITY Right 12/08/2013   Procedure: Repeat IRRIGATION AND DEBRIDEMENT Right Leg with Stimulan bead placement;  Surgeon: Kathryne Hitch, MD;  Location: [facility] OR;  Service: Orthopedics;  Laterality: Right;   KNEE ARTHROSCOPY Right 03/08/2014    Procedure:  arthroscopy RIGHT KNEE WITH EXTENSIVE DEBRIDEMENT;  Surgeon: Kathryne Hitch, MD;  Location: [facility];  Service: Orthopedics;  Laterality: Right;   MOUTH SURGERY  2014   bone grafts for implants   ORIF TIBIA PLATEAU Right 11/23/2013   Procedure: OPEN REDUCTION INTERNAL FIXATION (ORIF) RIGHT TIBIAL PLATEAU;  Surgeon: Kathryne Hitch, MD;  Location: [facility];  Service: Orthopedics;  Laterality: Right;   TOTAL KNEE ARTHROPLASTY Right 12/24/2014   dr Magnus Ivan   TOTAL KNEE ARTHROPLASTY Right 12/24/2014   Procedure: RIGHT TOTAL KNEE ARTHROPLASTY;  Surgeon: Kathryne Hitch, MD;  Location: [facility] OR;  Service: Orthopedics;  Laterality: Right;   TRIGGER FINGER RELEASE Right ~2013   middle finger   Patient Active Problem List   Diagnosis Date Noted   Atypical chest pain 11/30/2018   GERD (gastroesophageal reflux disease) 11/30/2018   Essential hypertension 11/30/2018   Tobacco abuse 11/30/2018   Obesity (BMI 35.0-39.9 without comorbidity) 11/30/2018   Hx of adenomatous colonic polyps 02/21/2018   Traumatic arthritis of right knee 12/24/2014   Status post total right knee replacement 12/24/2014   Retained orthopedic hardware right tibia; previous tibial plateau fracture 03/08/2014   S/P right knee arthroscopy 03/08/2014   Infection of right tibia post ORIF tibial plateau 12/05/2013   Tibial plateau fracture 11/17/2013    PCP: Martha Clan  REFERRING PROVIDER:  Kirtland Bouchard, PA-C  REFERRING DIAG:  Diagnosis  M25.561,G89.29 (ICD-10-CM) - Chronic pain of right knee    THERAPY DIAG:  Diagnosis  M25.561,G89.29 (ICD-10-CM) - Chronic pain of right knee   Muscle weakness Rationale for Evaluation and Treatment: Rehabilitation  ONSET DATE: 2014.    SUBJECTIVE:   SUBJECTIVE STATEMENT: Pt states that he shattered his Rt leg in 2014 after falling off a roof.  He ended up having a TKR in 2015.  He went to the orthopod who states that the knee cap is moving.   Sometimes his left knee bothers him more than the right.   PERTINENT HISTORY: See above  PAIN:  Are you having pain? Yes: NPRS scale: 4/10; worst in the past 7/10 Pain location: anterior knee  Pain description: feels heavy, dull strong ache.   Aggravating factors: wt bearing  Relieving factors: rest, meds   PRECAUTIONS: None   WEIGHT BEARING RESTRICTIONS: No  FALLS:  Has patient fallen in last 6 months? 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   PLOF: Independent  PATIENT GOALS: PT would like to be able to move without having to think about it.  Less pain   NEXT MD VISIT: not schedule  OBJECTIVE:  Note: Objective measures were completed at Evaluation unless otherwise noted.  DIAGNOSTIC FINDINGS: Right knee 2 views: Well-seated right total knee arthroplasty components.   No acute fractures or acute bony abnormalities.  Knee is well located.   EDEMA: none at this time but pt states in the evening when he has been up for a while it will swell   POSTURE: No Significant postural limitations   LOWER EXTREMITY ROM:  Active ROM Right eval Left eval  Hip flexion    Hip extension    Hip abduction    Hip adduction    Hip internal rotation    Hip external rotation    Knee flexion 130   Knee extension 1   Ankle dorsiflexion    Ankle plantarflexion    Ankle inversion    Ankle eversion     (Blank rows = not tested)  LOWER EXTREMITY MMT:  MMT Right eval Left eval  Hip flexion 4 5  Hip extension 4 5  Hip abduction 4 5  Hip adduction    Hip internal rotation    Hip external rotation    Knee flexion 5 5  Knee extension 5 5  Ankle dorsiflexion 4+ 5  Ankle plantarflexion    Ankle inversion    Ankle eversion     (Blank rows = not  tested) hamstring length  RT :160; LT 150 Quadricep length: heel to buttock RT : 8  LT 5"  FUNCTIONAL TESTS:  30 seconds chair stand test:  11; poor for your age and sex  2 minute walk test: 618 with complaint of knee burning  Single leg stance:  RT: 60"  , LT:   60"  TREATMENT DATE: 03/14/24 Evaluation  Sit to stand x 10 Hamstring stretch 30" x 1  Quad set x 5   PATIENT EDUCATION:  Education details: HEP Person educated: Patient Education method: Explanation Education comprehension: verbalized understanding  HOME EXERCISE PROGRAM: Access Code: [facility] URL: [email or web address] Date: 03/14/2024 Prepared by: Virgina Organ  Exercises - Sit to Stand with Pelvic Floor Contraction  - 3 x daily - 7 x weekly - 1 sets - 10 reps - Supine Quadricep Sets  - 10 x daily - 7 x weekly - 1 sets - 10 reps - 5" hold - Supine Hamstring Stretch  - 1 x daily - 7 x weekly - 1 sets - 3 reps - 30" hold - Supine Bridge  - 2 x daily - 7 x weekly - 1 sets - 10 reps - 3-5 hold  ASSESSMENT:  CLINICAL IMPRESSION: Patient is a 58 y.o. male who was seen today for physical therapy evaluation and treatment for chronic knee pain.  Evaluation demonstrates decreased strength,and increased pain.  Mr. Shives will benefit from skilled PT to address his deficits and improve his functioning ability.   OBJECTIVE IMPAIRMENTS: decreased activity tolerance, decreased strength, and pain.   ACTIVITY LIMITATIONS: carrying, lifting, squatting, stairs, and locomotion level  PARTICIPATION LIMITATIONS: community activity  REHAB POTENTIAL: Good  CLINICAL DECISION MAKING: Stable/uncomplicated  EVALUATION COMPLEXITY: Low   GOALS: Goals reviewed with patient? No  SHORT TERM GOALS: Target date: 03/28/24 PT to be I in HEP to decrease his knee pain to no greater than a  4/10 Baseline: Goal status: INITIAL  2.  PT LE strength to be increased 1/2 grade to allow pt to be able to rise from a couch without discomfort.  Baseline:  Goal status: INITIAL   LONG TERM GOALS: Target date: 04/11/24  PT to be I in an advanced HEP to decrease his knee pain to no greater than a 2/10 Baseline:  Goal status: INITIAL  2.  PT LE strength to be increased 1 grade to allow pt to be able to squat and rise without difficulty Baseline:  Goal status: INITIAL  3.  Pt to be able to go up and down 8 steps without pain or difficulty  Baseline:  Goal status: INITIAL   PLAN:  PT FREQUENCY: 2x/week  PT DURATION: 6 weeks  PLANNED INTERVENTIONS: 97110-Therapeutic exercises, 97530- Therapeutic activity, 97112- Neuromuscular re-education, 97535- Self Care, and 16109- Manual therapy  PLAN FOR NEXT SESSION: continue with improving strength, flexibility and decreasing pain; quad stretch, step up, minisquat.  Virgina Organ, PT CLT [phone]  03/14/2024, 4:00 PM

## 2024-03-19 ENCOUNTER — Ambulatory Visit: Payer: No Typology Code available for payment source | Admitting: Orthopaedic Surgery

## 2024-03-20 ENCOUNTER — Encounter (HOSPITAL_COMMUNITY): Payer: Self-pay

## 2024-03-20 ENCOUNTER — Ambulatory Visit (HOSPITAL_COMMUNITY)

## 2024-03-20 DIAGNOSIS — G8929 Other chronic pain: Secondary | ICD-10-CM

## 2024-03-20 DIAGNOSIS — M25561 Pain in right knee: Secondary | ICD-10-CM | POA: Diagnosis not present

## 2024-03-20 DIAGNOSIS — M6281 Muscle weakness (generalized): Secondary | ICD-10-CM

## 2024-03-20 NOTE — Therapy (Signed)
 OUTPATIENT PHYSICAL THERAPY LOWER EXTREMITY TREATMENT   Patient Name: Juana Montini MRN: [id number] DOB:06-03-1966, 58 y.o., male Today's Date: 03/20/2024  END OF SESSION:  PT End of Session - 03/20/24 1350     Visit Number 2    Number of Visits 8    Date for PT Re-Evaluation 04/11/24    Authorization Type AETNA    PT Start Time 1349    PT Stop Time 1427    PT Time Calculation (min) 38 min    Activity Tolerance Patient tolerated treatment well    Behavior During Therapy [facility] for tasks assessed/performed             Past Medical History:  Diagnosis Date   Allergy    Arthritis    Atypical chest pain 11/30/2018   Depression    Essential hypertension 11/30/2018   GERD (gastroesophageal reflux disease)    Hx of adenomatous colonic polyps 02/21/2018   Hypertension    MRSA infection    right leg   Obesity (BMI 35.0-39.9 without comorbidity) 11/30/2018   Sleep apnea    cpap   Tobacco abuse 11/30/2018   Past Surgical History:  Procedure Laterality Date   EXTERNAL FIXATION LEG Right 11/17/2013   Procedure: EXTERNAL FIXATION LEG;  Surgeon: Cheral Almas, MD;  Location: [facility];  Service: Orthopedics;  Laterality: Right;   EXTERNAL FIXATION REMOVAL Right 11/23/2013   Procedure: REMOVAL EXTERNAL FIXATION RIGHT LEG;  Surgeon: Kathryne Hitch, MD;  Location: [facility];  Service: Orthopedics;  Laterality: Right;   HARDWARE REMOVAL Right 03/08/2014   Procedure: Removal plate and screws right tibia,;  Surgeon: Kathryne Hitch, MD;  Location: [facility];  Service: Orthopedics;  Laterality: Right;   I & D EXTREMITY Right 12/05/2013   Procedure: IRRIGATION AND DEBRIDEMENT RIGHT LEG;  Surgeon: Kathryne Hitch, MD;  Location: [facility] OR;  Service: Orthopedics;  Laterality: Right;   I & D EXTREMITY Right 12/08/2013   Procedure: Repeat IRRIGATION AND DEBRIDEMENT Right Leg with Stimulan bead placement;  Surgeon: Kathryne Hitch, MD;  Location: [facility] OR;  Service:  Orthopedics;  Laterality: Right;   KNEE ARTHROSCOPY Right 03/08/2014   Procedure:  arthroscopy RIGHT KNEE WITH EXTENSIVE DEBRIDEMENT;  Surgeon: Kathryne Hitch, MD;  Location: [facility];  Service: Orthopedics;  Laterality: Right;   MOUTH SURGERY  2014   bone grafts for implants   ORIF TIBIA PLATEAU Right 11/23/2013   Procedure: OPEN REDUCTION INTERNAL FIXATION (ORIF) RIGHT TIBIAL PLATEAU;  Surgeon: Kathryne Hitch, MD;  Location: [facility];  Service: Orthopedics;  Laterality: Right;   TOTAL KNEE ARTHROPLASTY Right 12/24/2014   dr Magnus Ivan   TOTAL KNEE ARTHROPLASTY Right 12/24/2014   Procedure: RIGHT TOTAL KNEE ARTHROPLASTY;  Surgeon: Kathryne Hitch, MD;  Location: [facility] OR;  Service: Orthopedics;  Laterality: Right;   TRIGGER FINGER RELEASE Right ~2013   middle finger   Patient Active Problem List   Diagnosis Date Noted   Atypical chest pain 11/30/2018   GERD (gastroesophageal reflux disease) 11/30/2018   Essential hypertension 11/30/2018   Tobacco abuse 11/30/2018   Obesity (BMI 35.0-39.9 without comorbidity) 11/30/2018   Hx of adenomatous colonic polyps 02/21/2018   Traumatic arthritis of right knee 12/24/2014   Status post total right knee replacement 12/24/2014   Retained orthopedic hardware right tibia; previous tibial plateau fracture 03/08/2014   S/P right knee arthroscopy 03/08/2014   Infection of right tibia post ORIF tibial plateau 12/05/2013   Tibial plateau fracture 11/17/2013    PCP:  Martha Clan  REFERRING PROVIDER:  Kirtland Bouchard, PA-C REFERRING DIAG:  Diagnosis  M25.561,G89.29 (ICD-10-CM) - Chronic pain of right knee    THERAPY DIAG:  Diagnosis  M25.561,G89.29 (ICD-10-CM) - Chronic pain of right knee   Muscle weakness Rationale for Evaluation and Treatment: Rehabilitation  ONSET DATE: 2014.    SUBJECTIVE:   SUBJECTIVE STATEMENT: 03/20/24:  Reports he did a lot of yard work this pt weekend, on feet for 10 hours.  Current pain scale 5/10  constant sore pain.  Has began HEP without questions.    Eval:  Pt states that he shattered his Rt leg in 2014 after falling off a roof.  He ended up having a TKR in 2015.  He went to the orthopod who states that the knee cap is moving.  Sometimes his left knee bothers him more than the right.   PERTINENT HISTORY: See above  PAIN:  Are you having pain? Yes: NPRS scale: 4/10; worst in the past 7/10 Pain location: anterior knee  Pain description: feels heavy, dull strong ache.   Aggravating factors: wt bearing  Relieving factors: rest, meds   PRECAUTIONS: None   WEIGHT BEARING RESTRICTIONS: No  FALLS:  Has patient fallen in last 6 months? 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   PLOF: Independent  PATIENT GOALS: PT would like to be able to move without having to think about it.  Less pain   NEXT MD VISIT: not schedule  OBJECTIVE:  Note: Objective measures were completed at Evaluation unless otherwise noted.  DIAGNOSTIC FINDINGS: Right knee 2 views: Well-seated right total knee arthroplasty components.   No acute fractures or acute bony abnormalities.  Knee is well located.   EDEMA: none at this time but pt states in the evening when he has been up for a while it will swell   POSTURE: No Significant postural limitations   LOWER EXTREMITY ROM:  Active ROM Right eval Left eval  Hip flexion    Hip extension    Hip abduction    Hip adduction    Hip internal rotation    Hip external rotation    Knee flexion 130   Knee extension 1   Ankle dorsiflexion    Ankle plantarflexion    Ankle inversion    Ankle eversion     (Blank rows = not tested)  LOWER EXTREMITY MMT:  MMT Right eval Left eval  Hip flexion 4 5  Hip extension 4 5  Hip  abduction 4 5  Hip adduction    Hip internal rotation    Hip external rotation    Knee flexion 5 5  Knee extension 5 5  Ankle dorsiflexion 4+ 5  Ankle plantarflexion    Ankle inversion    Ankle eversion     (Blank rows = not tested) hamstring length  RT :160; LT 150 Quadricep length: heel to buttock RT : 8  LT 5"  FUNCTIONAL TESTS:  30 seconds chair stand test:  11; poor for your age and sex  2 minute walk test: 618 with complaint of knee burning  Single leg stance:  RT: 60"  , LT:   60"  TREATMENT DATE:  03/20/24:   Reviewed goals Educated importance of HEP compliance for maximal benefits Pt able to recall:  quad set 10x 5" Bridge 10x 5" hamstring stretch: 1 x with hands behind knee; with rope 2x 30" Sit to stand no HHA eccentric control 10x  Prone: Quad stretch withe rope 3x 30"  Standing: Minisquat front of chair 10x Forward step up 4in 10x Lateral step up 10x    03/14/24 Evaluation  Sit to stand x 10 Hamstring stretch 30" x 1  Quad set x 5   PATIENT EDUCATION:  Education details: HEP Person educated: Patient Education method: Explanation Education comprehension: verbalized understanding  HOME EXERCISE PROGRAM: Access Code: [facility] URL: [email or web address] Date: 03/14/2024 Prepared by: Virgina Organ  Exercises - Sit to Stand with Pelvic Floor Contraction  - 3 x daily - 7 x weekly - 1 sets - 10 reps - Supine Quadricep Sets  - 10 x daily - 7 x weekly - 1 sets - 10 reps - 5" hold - Supine Hamstring Stretch  - 1 x daily - 7 x weekly - 1 sets - 3 reps - 30" hold - Supine Bridge  - 2 x daily - 7 x weekly - 1 sets - 10 reps - 3-5 hold  ASSESSMENT:  CLINICAL IMPRESSION: 03/20/24:  Reviewed goals and educated importance of HEP compliance for maximal benefits.  Pt able to recall current exercise program and presents  with good form and mechanics.  Session focus with proximal strengthening as well as functional strengthening.  Noted crepitus with sit to stand and during step ups, monitored pain through session.  Pt tolerated well with ability to complete all exercises with no to minimal increase in pain.    Eval:  Patient is a 58 y.o. male who was seen today for physical therapy evaluation and treatment for chronic knee pain.  Evaluation demonstrates decreased strength,and increased pain.  Mr. Lauver will benefit from skilled PT to address his deficits and improve his functioning ability.   OBJECTIVE IMPAIRMENTS: decreased activity tolerance, decreased strength, and pain.   ACTIVITY LIMITATIONS: carrying, lifting, squatting, stairs, and locomotion level  PARTICIPATION LIMITATIONS: community activity  REHAB POTENTIAL: Good  CLINICAL DECISION MAKING: Stable/uncomplicated  EVALUATION COMPLEXITY: Low   GOALS: Goals reviewed with patient? No  SHORT TERM GOALS: Target date: 03/28/24 PT to be I in HEP to decrease his knee pain to no greater than a 4/10 Baseline: Goal status: INITIAL  2.  PT LE strength to be increased 1/2 grade to allow pt to be able to rise from a couch without discomfort.  Baseline:  Goal status: INITIAL   LONG TERM GOALS: Target date: 04/11/24  PT to be I in an advanced HEP to decrease his knee pain to no greater than a 2/10 Baseline:  Goal status: INITIAL  2.  PT LE strength to be increased 1 grade to allow pt to be able to squat and rise without difficulty Baseline:  Goal status: INITIAL  3.  Pt to be able to go up and down 8 steps without pain or difficulty  Baseline:  Goal status: INITIAL   PLAN:  PT FREQUENCY: 2x/week  PT DURATION: 6 weeks  PLANNED INTERVENTIONS: 97110-Therapeutic exercises, 97530- Therapeutic activity, 97112- Neuromuscular re-education, 97535- Self Care, and 16109- Manual therapy  PLAN FOR NEXT SESSION: continue with improving strength,  flexibility and decreasing pain; Increase height with step up training.  Add vector, sidestep and tandem stance.  Becky Sax, LPTA/CLT; Rowe [place]ack [phone]  03/20/2024, 3:31 PM

## 2024-03-21 ENCOUNTER — Ambulatory Visit (HOSPITAL_COMMUNITY)

## 2024-03-21 ENCOUNTER — Encounter (HOSPITAL_COMMUNITY): Payer: Self-pay

## 2024-03-21 DIAGNOSIS — M25561 Pain in right knee: Secondary | ICD-10-CM | POA: Diagnosis not present

## 2024-03-21 DIAGNOSIS — G8929 Other chronic pain: Secondary | ICD-10-CM

## 2024-03-21 DIAGNOSIS — M6281 Muscle weakness (generalized): Secondary | ICD-10-CM

## 2024-03-21 NOTE — Therapy (Signed)
 OUTPATIENT PHYSICAL THERAPY LOWER EXTREMITY TREATMENT   Patient Name: Roger Ayers MRN: [id number] DOB:July 12, 1966, 58 y.o., male Today's Date: 03/21/2024  END OF SESSION:  PT End of Session - 03/21/24 1448     Visit Number 3    Number of Visits 8    Date for PT Re-Evaluation 04/11/24    Authorization Type AETNA    PT Start Time 1349    PT Stop Time 1427    PT Time Calculation (min) 38 min    Activity Tolerance Patient tolerated treatment well    Behavior During Therapy [facility] for tasks assessed/performed              Past Medical History:  Diagnosis Date   Allergy    Arthritis    Atypical chest pain 11/30/2018   Depression    Essential hypertension 11/30/2018   GERD (gastroesophageal reflux disease)    Hx of adenomatous colonic polyps 02/21/2018   Hypertension    MRSA infection    right leg   Obesity (BMI 35.0-39.9 without comorbidity) 11/30/2018   Sleep apnea    cpap   Tobacco abuse 11/30/2018   Past Surgical History:  Procedure Laterality Date   EXTERNAL FIXATION LEG Right 11/17/2013   Procedure: EXTERNAL FIXATION LEG;  Surgeon: Cheral Almas, MD;  Location: [facility];  Service: Orthopedics;  Laterality: Right;   EXTERNAL FIXATION REMOVAL Right 11/23/2013   Procedure: REMOVAL EXTERNAL FIXATION RIGHT LEG;  Surgeon: Kathryne Hitch, MD;  Location: [facility];  Service: Orthopedics;  Laterality: Right;   HARDWARE REMOVAL Right 03/08/2014   Procedure: Removal plate and screws right tibia,;  Surgeon: Kathryne Hitch, MD;  Location: [facility];  Service: Orthopedics;  Laterality: Right;   I & D EXTREMITY Right 12/05/2013   Procedure: IRRIGATION AND DEBRIDEMENT RIGHT LEG;  Surgeon: Kathryne Hitch, MD;  Location: [facility] OR;  Service: Orthopedics;  Laterality: Right;   I & D EXTREMITY Right 12/08/2013   Procedure: Repeat IRRIGATION AND DEBRIDEMENT Right Leg with Stimulan bead placement;  Surgeon: Kathryne Hitch, MD;  Location: [facility] OR;  Service:  Orthopedics;  Laterality: Right;   KNEE ARTHROSCOPY Right 03/08/2014   Procedure:  arthroscopy RIGHT KNEE WITH EXTENSIVE DEBRIDEMENT;  Surgeon: Kathryne Hitch, MD;  Location: [facility];  Service: Orthopedics;  Laterality: Right;   MOUTH SURGERY  2014   bone grafts for implants   ORIF TIBIA PLATEAU Right 11/23/2013   Procedure: OPEN REDUCTION INTERNAL FIXATION (ORIF) RIGHT TIBIAL PLATEAU;  Surgeon: Kathryne Hitch, MD;  Location: [facility];  Service: Orthopedics;  Laterality: Right;   TOTAL KNEE ARTHROPLASTY Right 12/24/2014   dr Magnus Ivan   TOTAL KNEE ARTHROPLASTY Right 12/24/2014   Procedure: RIGHT TOTAL KNEE ARTHROPLASTY;  Surgeon: Kathryne Hitch, MD;  Location: [facility] OR;  Service: Orthopedics;  Laterality: Right;   TRIGGER FINGER RELEASE Right ~2013   middle finger   Patient Active Problem List   Diagnosis Date Noted   Atypical chest pain 11/30/2018   GERD (gastroesophageal reflux disease) 11/30/2018   Essential hypertension 11/30/2018   Tobacco abuse 11/30/2018   Obesity (BMI 35.0-39.9 without comorbidity) 11/30/2018   Hx of adenomatous colonic polyps 02/21/2018   Traumatic arthritis of right knee 12/24/2014   Status post total right knee replacement 12/24/2014   Retained orthopedic hardware right tibia; previous tibial plateau fracture 03/08/2014   S/P right knee arthroscopy 03/08/2014   Infection of right tibia post ORIF tibial plateau 12/05/2013   Tibial plateau fracture 11/17/2013  PCP: Martha Clan  REFERRING PROVIDER:  Kirtland Bouchard, PA-C REFERRING DIAG:  Diagnosis  M25.561,G89.29 (ICD-10-CM) - Chronic pain of right knee    THERAPY DIAG:  Diagnosis  M25.561,G89.29 (ICD-10-CM) - Chronic pain of right knee   Muscle weakness Rationale for Evaluation and Treatment: Rehabilitation  ONSET DATE: 2014.    SUBJECTIVE:   SUBJECTIVE STATEMENT: 03/21/2024: 3/10, today. Pt reporting the more often he is on his knee, the more painful. Pt reports that the  onset of pain started with "gunshot" like feeling, while getting out of the car. 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   Eval:  Pt states that he shattered his Rt leg in 2014 after falling off a roof.  He ended up having a TKR in 2015.  He went to the orthopod who states that the knee cap is moving.  Sometimes his left knee bothers him more than the right.   PERTINENT HISTORY: See above  PAIN:  Are you having pain? Yes: NPRS scale: 4/10; worst in the past 7/10 Pain location: anterior knee  Pain description: feels heavy, dull strong ache.   Aggravating factors: wt bearing  Relieving factors: rest, meds   PRECAUTIONS: None   WEIGHT BEARING RESTRICTIONS: No  FALLS:  Has patient fallen in last 6 months? 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   PLOF: Independent  PATIENT GOALS: PT would like to be able to move without having to think about it.  Less pain   NEXT MD VISIT: not schedule  OBJECTIVE:  Note: Objective measures were completed at Evaluation unless otherwise noted.  DIAGNOSTIC FINDINGS: Right knee 2 views: Well-seated right total knee arthroplasty components.   No acute fractures or acute bony abnormalities.  Knee is well located.   EDEMA: none at this time but pt states in the evening when he has been up for a while it will swell   POSTURE: No Significant postural limitations   LOWER EXTREMITY ROM:  Active ROM Right eval Left eval  Hip flexion    Hip extension    Hip abduction    Hip adduction    Hip internal rotation    Hip external rotation    Knee flexion 130   Knee extension 1   Ankle dorsiflexion    Ankle plantarflexion    Ankle inversion    Ankle eversion     (Blank rows = not tested)  LOWER EXTREMITY MMT:  MMT Right eval  Left eval  Hip flexion 4 5  Hip extension 4 5  Hip abduction 4 5  Hip adduction    Hip internal rotation    Hip external rotation    Knee flexion 5 5  Knee extension 5 5  Ankle dorsiflexion 4+ 5  Ankle plantarflexion    Ankle inversion    Ankle eversion     (Blank rows = not tested) hamstring length  RT :160; LT 150 Quadricep length: heel to buttock RT : 8  LT 5"  FUNCTIONAL TESTS:  30 seconds chair stand test:  11; poor for your age and sex  2 minute walk test: 618 with complaint of knee burning  Single leg stance:  RT: 60"  , LT:   60"  TREATMENT DATE:  03/21/2024  -SLR 2 x 15 -6in step x 10 with cues for hip abduction and proper knee alignment. -6in lateral stepups x 15without UE support -Standing hip abduction vector 3 x 30'' with RTB at knees.  -Sidestepping 67ft x 4 with RTB -backward sled pull 2x83ft  03/20/24:   Reviewed goals Educated importance of HEP compliance for maximal benefits Pt able to recall:  quad set 10x 5" Bridge 10x 5" hamstring stretch: 1 x with hands behind knee; with rope 2x 30" Sit to stand no HHA eccentric control 10x  Prone: Quad stretch withe rope 3x 30"  Standing: Minisquat front of chair 10x Forward step up 4in 10x Lateral step up 10x    03/14/24 Evaluation  Sit to stand x 10 Hamstring stretch 30" x 1  Quad set x 5   PATIENT EDUCATION:  Education details: HEP Person educated: Patient Education method: Explanation Education comprehension: verbalized understanding  HOME EXERCISE PROGRAM: Access Code: [facility] URL: [email or web address] Date: 03/14/2024 Prepared by: Virgina Organ  Exercises - Sit to Stand with Pelvic Floor Contraction  - 3 x daily - 7 x weekly - 1 sets - 10 reps - Supine Quadricep Sets  - 10 x daily - 7 x weekly - 1 sets - 10 reps - 5" hold - Supine Hamstring  Stretch  - 1 x daily - 7 x weekly - 1 sets - 3 reps - 30" hold - Supine Bridge  - 2 x daily - 7 x weekly - 1 sets - 10 reps - 3-5 hold  ASSESSMENT:  CLINICAL IMPRESSION: Pt tolerating today's session well. Added hip vectors with noticeable fatigue in R gluteal musculature. Continues with pain but was able to change pain with squatting and proper foot positioning during squatting. Step ups are easy for pt, would add dynamic challenges at this point. Pt will benefit from skilled Physical Therapy services to address deficits/limitations in order to improve functional and QOL.   Eval:  Patient is a 58 y.o. male who was seen today for physical therapy evaluation and treatment for chronic knee pain.  Evaluation demonstrates decreased strength,and increased pain.  Mr. Meador will benefit from skilled PT to address his deficits and improve his functioning ability.   OBJECTIVE IMPAIRMENTS: decreased activity tolerance, decreased strength, and pain.   ACTIVITY LIMITATIONS: carrying, lifting, squatting, stairs, and locomotion level  PARTICIPATION LIMITATIONS: community activity  REHAB POTENTIAL: Good  CLINICAL DECISION MAKING: Stable/uncomplicated  EVALUATION COMPLEXITY: Low   GOALS: Goals reviewed with patient? No  SHORT TERM GOALS: Target date: 03/28/24 PT to be I in HEP to decrease his knee pain to no greater than a 4/10 Baseline: Goal status: INITIAL  2.  PT LE strength to be increased 1/2 grade to allow pt to be able to rise from a couch without discomfort.  Baseline:  Goal status: INITIAL   LONG TERM GOALS: Target date: 04/11/24  PT to be I in an advanced HEP to decrease his knee pain to no greater than a 2/10 Baseline:  Goal status: INITIAL  2.  PT LE strength to be increased 1 grade to allow pt to be able to squat and rise without difficulty Baseline:  Goal status: INITIAL  3.  Pt to be able to go up and down 8 steps without pain or difficulty  Baseline:  Goal status:  INITIAL   PLAN:  PT FREQUENCY: 2x/week  PT DURATION: 6 weeks  PLANNED INTERVENTIONS: 97110-Therapeutic exercises, 97530- Therapeutic activity, [id number]- Neuromuscular re-education, 97535- Self Care,  and 16109- Manual therapy  PLAN FOR NEXT SESSION: continue with improving strength, flexibility and decreasing pain; Increase height with step up training.  Add vector, sidestep and tandem stance.  Nelida Meuse PT, DPT [facility] [place] [phone] office  03/21/2024, 2:49 PM

## 2024-03-27 ENCOUNTER — Ambulatory Visit (HOSPITAL_COMMUNITY): Attending: Physician Assistant | Admitting: Physical Therapy

## 2024-03-27 DIAGNOSIS — M6281 Muscle weakness (generalized): Secondary | ICD-10-CM | POA: Insufficient documentation

## 2024-03-27 DIAGNOSIS — M25561 Pain in right knee: Secondary | ICD-10-CM | POA: Insufficient documentation

## 2024-03-27 DIAGNOSIS — G8929 Other chronic pain: Secondary | ICD-10-CM | POA: Diagnosis present

## 2024-03-27 NOTE — Therapy (Signed)
 OUTPATIENT PHYSICAL THERAPY LOWER EXTREMITY TREATMENT   Patient Name: Roger Ayers MRN: [id number] DOB:June 24, 1966, 58 y.o., male Today's Date: 03/27/2024  END OF SESSION:  PT End of Session - 03/27/24 1544     Visit Number 4    Number of Visits 8    Date for PT Re-Evaluation 04/11/24    Authorization Type AETNA    PT Start Time 1441    PT Stop Time 1522    PT Time Calculation (min) 41 min    Activity Tolerance Patient tolerated treatment well    Behavior During Therapy [facility] for tasks assessed/performed               Past Medical History:  Diagnosis Date   Allergy    Arthritis    Atypical chest pain 11/30/2018   Depression    Essential hypertension 11/30/2018   GERD (gastroesophageal reflux disease)    Hx of adenomatous colonic polyps 02/21/2018   Hypertension    MRSA infection    right leg   Obesity (BMI 35.0-39.9 without comorbidity) 11/30/2018   Sleep apnea    cpap   Tobacco abuse 11/30/2018   Past Surgical History:  Procedure Laterality Date   EXTERNAL FIXATION LEG Right 11/17/2013   Procedure: EXTERNAL FIXATION LEG;  Surgeon: Cheral Almas, MD;  Location: [facility];  Service: Orthopedics;  Laterality: Right;   EXTERNAL FIXATION REMOVAL Right 11/23/2013   Procedure: REMOVAL EXTERNAL FIXATION RIGHT LEG;  Surgeon: Kathryne Hitch, MD;  Location: [facility];  Service: Orthopedics;  Laterality: Right;   HARDWARE REMOVAL Right 03/08/2014   Procedure: Removal plate and screws right tibia,;  Surgeon: Kathryne Hitch, MD;  Location: [facility];  Service: Orthopedics;  Laterality: Right;   I & D EXTREMITY Right 12/05/2013   Procedure: IRRIGATION AND DEBRIDEMENT RIGHT LEG;  Surgeon: Kathryne Hitch, MD;  Location: [facility] OR;  Service: Orthopedics;  Laterality: Right;   I & D EXTREMITY Right 12/08/2013   Procedure: Repeat IRRIGATION AND DEBRIDEMENT Right Leg with Stimulan bead placement;  Surgeon: Kathryne Hitch, MD;  Location: [facility] OR;  Service:  Orthopedics;  Laterality: Right;   KNEE ARTHROSCOPY Right 03/08/2014   Procedure:  arthroscopy RIGHT KNEE WITH EXTENSIVE DEBRIDEMENT;  Surgeon: Kathryne Hitch, MD;  Location: [facility];  Service: Orthopedics;  Laterality: Right;   MOUTH SURGERY  2014   bone grafts for implants   ORIF TIBIA PLATEAU Right 11/23/2013   Procedure: OPEN REDUCTION INTERNAL FIXATION (ORIF) RIGHT TIBIAL PLATEAU;  Surgeon: Kathryne Hitch, MD;  Location: [facility];  Service: Orthopedics;  Laterality: Right;   TOTAL KNEE ARTHROPLASTY Right 12/24/2014   dr Magnus Ivan   TOTAL KNEE ARTHROPLASTY Right 12/24/2014   Procedure: RIGHT TOTAL KNEE ARTHROPLASTY;  Surgeon: Kathryne Hitch, MD;  Location: [facility] OR;  Service: Orthopedics;  Laterality: Right;   TRIGGER FINGER RELEASE Right ~2013   middle finger   Patient Active Problem List   Diagnosis Date Noted   Atypical chest pain 11/30/2018   GERD (gastroesophageal reflux disease) 11/30/2018   Essential hypertension 11/30/2018   Tobacco abuse 11/30/2018   Obesity (BMI 35.0-39.9 without comorbidity) 11/30/2018   Hx of adenomatous colonic polyps 02/21/2018   Traumatic arthritis of right knee 12/24/2014   Status post total right knee replacement 12/24/2014   Retained orthopedic hardware right tibia; previous tibial plateau fracture 03/08/2014   S/P right knee arthroscopy 03/08/2014   Infection of right tibia post ORIF tibial plateau 12/05/2013   Tibial plateau fracture 11/17/2013  PCP: Martha Clan  REFERRING PROVIDER:  Kirtland Bouchard, PA-C REFERRING DIAG:  Diagnosis  M25.561,G89.29 (ICD-10-CM) - Chronic pain of right knee    THERAPY DIAG:  Diagnosis  M25.561,G89.29 (ICD-10-CM) - Chronic pain of right knee   Muscle weakness Rationale for Evaluation and Treatment: Rehabilitation  ONSET DATE: 2014.    SUBJECTIVE:   SUBJECTIVE STATEMENT: 03/27/2024: pt reports it's "not horrible.  It hurts but it's always going to hurt".     Eval:  Pt states  that he shattered his Rt leg in 2014 after falling off a roof.  He ended up having a TKR in 2015.  He went to the orthopod who states that the knee cap is moving.  Sometimes his left knee bothers him more than the right.   PERTINENT HISTORY: See above  PAIN:  Are you having pain? Yes: NPRS scale: 4/10; worst in the past 7/10 Pain location: anterior knee  Pain description: feels heavy, dull strong ache.   Aggravating factors: wt bearing  Relieving factors: rest, meds   PRECAUTIONS: None   WEIGHT BEARING RESTRICTIONS: No  FALLS:  Has patient fallen in last 6 months? 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   PLOF: Independent  PATIENT GOALS: PT would like to be able to move without having to think about it.  Less pain   NEXT MD VISIT: not schedule  OBJECTIVE:  Note: Objective measures were completed at Evaluation unless otherwise noted.  DIAGNOSTIC FINDINGS: Right knee 2 views: Well-seated right total knee arthroplasty components.   No acute fractures or acute bony abnormalities.  Knee is well located.   EDEMA: none at this time but pt states in the evening when he has been up for a while it will swell   POSTURE: No Significant postural limitations   LOWER EXTREMITY ROM:  Active ROM Right eval Left eval  Hip flexion    Hip extension    Hip abduction    Hip adduction    Hip internal rotation    Hip external rotation    Knee flexion 130   Knee extension 1   Ankle dorsiflexion    Ankle plantarflexion    Ankle inversion    Ankle eversion     (Blank rows = not tested)  LOWER EXTREMITY MMT:  MMT Right eval Left eval  Hip flexion 4 5  Hip extension 4 5  Hip abduction 4 5  Hip adduction    Hip internal rotation    Hip external  rotation    Knee flexion 5 5  Knee extension 5 5  Ankle dorsiflexion 4+ 5  Ankle plantarflexion    Ankle inversion    Ankle eversion     (Blank rows = not tested) hamstring length  RT :160; LT 150 Quadricep length: heel to buttock RT : 8  LT 5"  FUNCTIONAL TESTS:  30 seconds chair stand test:  11; poor for your age and sex  2 minute walk test: 618 with complaint of knee burning  Single leg stance:  RT: 60"  , LT:   60"  TREATMENT DATE:  03/27/24 Standing:  heelraises 20X  6" lateral step ups 2X10  6" forward step ups and power up 2X10  Lunges onto 6" no UE 2X10 each Body craft walk outs 4 pl 5X each direction Body craft leg press 4 pl 2X10 bil LE's Nustep level 5, 5 minutes LE only   03/21/2024  -SLR 2 x 15 -6in step x 10 with cues for hip abduction and proper knee alignment. -6in lateral stepups x 15without UE support -Standing hip abduction vector 3 x 30'' with RTB at knees.  -Sidestepping 28ft x 4 with RTB -backward sled pull 2x13ft  03/20/24:   Reviewed goals Educated importance of HEP compliance for maximal benefits Pt able to recall:  quad set 10x 5" Bridge 10x 5" hamstring stretch: 1 x with hands behind knee; with rope 2x 30" Sit to stand no HHA eccentric control 10x  Prone: Quad stretch withe rope 3x 30"  Standing: Minisquat front of chair 10x Forward step up 4in 10x Lateral step up 10x    03/14/24 Evaluation  Sit to stand x 10 Hamstring stretch 30" x 1  Quad set x 5   PATIENT EDUCATION:  Education details: HEP Person educated: Patient Education method: Explanation Education comprehension: verbalized understanding  HOME EXERCISE PROGRAM: Access Code: [facility] URL: [email or web address] Date: 03/14/2024 Prepared by: Virgina Organ  Exercises - Sit to Stand with Pelvic Floor Contraction  - 3 x daily - 7  x weekly - 1 sets - 10 reps - Supine Quadricep Sets  - 10 x daily - 7 x weekly - 1 sets - 10 reps - 5" hold - Supine Hamstring Stretch  - 1 x daily - 7 x weekly - 1 sets - 3 reps - 30" hold - Supine Bridge  - 2 x daily - 7 x weekly - 1 sets - 10 reps - 3-5 hold  ASSESSMENT:  CLINICAL IMPRESSION: Pt arrived 10 minutes late for appt. Continued with LE strengthening/stabilization exercises.  Added resisted walkouts with ability to correct minimal LOB mostly with eccentric return from each direction. Leg press also added to work on quad strength and control.  Ended session with nustep to improve activity tolerance.  Noted fatigue at end of session today. Pt will benefit from skilled Physical Therapy services to address deficits/limitations in order to improve functional and QOL.   Eval:  Patient is a 58 y.o. male who was seen today for physical therapy evaluation and treatment for chronic knee pain.  Evaluation demonstrates decreased strength,and increased pain.  Mr. Trudel will benefit from skilled PT to address his deficits and improve his functioning ability.   OBJECTIVE IMPAIRMENTS: decreased activity tolerance, decreased strength, and pain.   ACTIVITY LIMITATIONS: carrying, lifting, squatting, stairs, and locomotion level  PARTICIPATION LIMITATIONS: community activity  REHAB POTENTIAL: Good  CLINICAL DECISION MAKING: Stable/uncomplicated  EVALUATION COMPLEXITY: Low   GOALS: Goals reviewed with patient? No  SHORT TERM GOALS: Target date: 03/28/24 PT to be I in HEP to decrease his knee pain to no greater than a 4/10 Baseline: Goal status: INITIAL  2.  PT LE strength to be increased 1/2 grade to allow pt to be able to rise from a couch without discomfort.  Baseline:  Goal status: INITIAL   LONG TERM GOALS: Target date: 04/11/24  PT to be I in an advanced HEP to decrease his knee pain to no greater than a 2/10 Baseline:  Goal status: INITIAL  2.  PT LE strength to be increased  1 grade  to allow pt to be able to squat and rise without difficulty Baseline:  Goal status: INITIAL  3.  Pt to be able to go up and down 8 steps without pain or difficulty  Baseline:  Goal status: INITIAL   PLAN:  PT FREQUENCY: 2x/week  PT DURATION: 6 weeks  PLANNED INTERVENTIONS: 97110-Therapeutic exercises, 97530- Therapeutic activity, 97112- Neuromuscular re-education, 97535- Self Care, and 12751- Manual therapy  PLAN FOR NEXT SESSION: continue with improving strength, flexibility and decreasing pain. Next session begin tandem stance and lunges without UE for stability challenge. Lurena Nida, PTA/CLT [facility] Ph: [phone]  03/27/2024, 3:47 PM

## 2024-03-29 ENCOUNTER — Telehealth (HOSPITAL_COMMUNITY): Payer: Self-pay

## 2024-03-29 ENCOUNTER — Encounter (HOSPITAL_COMMUNITY)

## 2024-03-29 NOTE — Telephone Encounter (Signed)
 No show #1, called and left message concerning missed apt today. Included next apt date and time with contact number included if needs to cancel or reschedule future apts.   Becky Sax, LPTA/CLT; [place] [phone]

## 2024-04-03 ENCOUNTER — Ambulatory Visit (HOSPITAL_COMMUNITY): Admitting: Physical Therapy

## 2024-04-03 DIAGNOSIS — G8929 Other chronic pain: Secondary | ICD-10-CM

## 2024-04-03 DIAGNOSIS — M6281 Muscle weakness (generalized): Secondary | ICD-10-CM

## 2024-04-03 DIAGNOSIS — M25561 Pain in right knee: Secondary | ICD-10-CM | POA: Diagnosis not present

## 2024-04-03 NOTE — Therapy (Signed)
 OUTPATIENT PHYSICAL THERAPY LOWER EXTREMITY TREATMENT   Patient Name: Roger Ayers MRN: [id number] DOB:Jun 08, 1966, 58 y.o., male Today's Date: 04/03/2024  END OF SESSION:  PT End of Session - 04/03/24 1437     Visit Number 5    Number of Visits 8    Date for PT Re-Evaluation 04/11/24    Authorization Type AETNA    PT Start Time 1435    PT Stop Time 1515    PT Time Calculation (min) 40 min    Activity Tolerance Patient tolerated treatment well    Behavior During Therapy [facility] for tasks assessed/performed                Past Medical History:  Diagnosis Date   Allergy    Arthritis    Atypical chest pain 11/30/2018   Depression    Essential hypertension 11/30/2018   GERD (gastroesophageal reflux disease)    Hx of adenomatous colonic polyps 02/21/2018   Hypertension    MRSA infection    right leg   Obesity (BMI 35.0-39.9 without comorbidity) 11/30/2018   Sleep apnea    cpap   Tobacco abuse 11/30/2018   Past Surgical History:  Procedure Laterality Date   EXTERNAL FIXATION LEG Right 11/17/2013   Procedure: EXTERNAL FIXATION LEG;  Surgeon: Cheral Almas, MD;  Location: [facility];  Service: Orthopedics;  Laterality: Right;   EXTERNAL FIXATION REMOVAL Right 11/23/2013   Procedure: REMOVAL EXTERNAL FIXATION RIGHT LEG;  Surgeon: Kathryne Hitch, MD;  Location: [facility];  Service: Orthopedics;  Laterality: Right;   HARDWARE REMOVAL Right 03/08/2014   Procedure: Removal plate and screws right tibia,;  Surgeon: Kathryne Hitch, MD;  Location: [facility];  Service: Orthopedics;  Laterality: Right;   I & D EXTREMITY Right 12/05/2013   Procedure: IRRIGATION AND DEBRIDEMENT RIGHT LEG;  Surgeon: Kathryne Hitch, MD;  Location: [facility] OR;  Service: Orthopedics;  Laterality: Right;   I & D EXTREMITY Right 12/08/2013   Procedure: Repeat IRRIGATION AND DEBRIDEMENT Right Leg with Stimulan bead placement;  Surgeon: Kathryne Hitch, MD;  Location: [facility] OR;  Service:  Orthopedics;  Laterality: Right;   KNEE ARTHROSCOPY Right 03/08/2014   Procedure:  arthroscopy RIGHT KNEE WITH EXTENSIVE DEBRIDEMENT;  Surgeon: Kathryne Hitch, MD;  Location: [facility];  Service: Orthopedics;  Laterality: Right;   MOUTH SURGERY  2014   bone grafts for implants   ORIF TIBIA PLATEAU Right 11/23/2013   Procedure: OPEN REDUCTION INTERNAL FIXATION (ORIF) RIGHT TIBIAL PLATEAU;  Surgeon: Kathryne Hitch, MD;  Location: [facility];  Service: Orthopedics;  Laterality: Right;   TOTAL KNEE ARTHROPLASTY Right 12/24/2014   dr Magnus Ivan   TOTAL KNEE ARTHROPLASTY Right 12/24/2014   Procedure: RIGHT TOTAL KNEE ARTHROPLASTY;  Surgeon: Kathryne Hitch, MD;  Location: [facility] OR;  Service: Orthopedics;  Laterality: Right;   TRIGGER FINGER RELEASE Right ~2013   middle finger   Patient Active Problem List   Diagnosis Date Noted   Atypical chest pain 11/30/2018   GERD (gastroesophageal reflux disease) 11/30/2018   Essential hypertension 11/30/2018   Tobacco abuse 11/30/2018   Obesity (BMI 35.0-39.9 without comorbidity) 11/30/2018   Hx of adenomatous colonic polyps 02/21/2018   Traumatic arthritis of right knee 12/24/2014   Status post total right knee replacement 12/24/2014   Retained orthopedic hardware right tibia; previous tibial plateau fracture 03/08/2014   S/P right knee arthroscopy 03/08/2014   Infection of right tibia post ORIF tibial plateau 12/05/2013   Tibial plateau fracture 11/17/2013  PCP: Martha Clan  REFERRING PROVIDER:  Kirtland Bouchard, PA-C REFERRING DIAG:  Diagnosis  M25.561,G89.29 (ICD-10-CM) - Chronic pain of right knee    THERAPY DIAG:  Diagnosis  M25.561,G89.29 (ICD-10-CM) - Chronic pain of right knee   Muscle weakness Rationale for Evaluation and Treatment: Rehabilitation  ONSET DATE: 2014.    SUBJECTIVE:   SUBJECTIVE STATEMENT: 04/03/2024: pt reports increased pain/soreness the day after his last appt.  pt reports it's "not horrible.   It hurts but it's always going to hurt".   States he did call and left VM to cancel his last appt, however it is listed as a NS.  Eval:  Pt states that he shattered his Rt leg in 2014 after falling off a roof.  He ended up having a TKR in 2015.  He went to the orthopod who states that the knee cap is moving.  Sometimes his left knee bothers him more than the right.   PERTINENT HISTORY: See above  PAIN:  Are you having pain? Yes: NPRS scale: 4/10; worst in the past 7/10 Pain location: anterior knee  Pain description: feels heavy, dull strong ache.   Aggravating factors: wt bearing  Relieving factors: rest, meds   PRECAUTIONS: None   WEIGHT BEARING RESTRICTIONS: No  FALLS:  Has patient fallen in last 6 months? 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   PLOF: Independent  PATIENT GOALS: PT would like to be able to move without having to think about it.  Less pain   NEXT MD VISIT: not schedule  OBJECTIVE:  Note: Objective measures were completed at Evaluation unless otherwise noted.  DIAGNOSTIC FINDINGS: Right knee 2 views: Well-seated right total knee arthroplasty components.   No acute fractures or acute bony abnormalities.  Knee is well located.   EDEMA: none at this time but pt states in the evening when he has been up for a while it will swell   POSTURE: No Significant postural limitations   LOWER EXTREMITY ROM:  Active ROM Right eval Left eval  Hip flexion    Hip extension    Hip abduction    Hip adduction    Hip internal rotation    Hip external rotation    Knee flexion 130   Knee extension 1   Ankle dorsiflexion    Ankle plantarflexion    Ankle inversion    Ankle eversion     (Blank rows = not tested)  LOWER EXTREMITY  MMT:  MMT Right eval Left eval  Hip flexion 4 5  Hip extension 4 5  Hip abduction 4 5  Hip adduction    Hip internal rotation    Hip external rotation    Knee flexion 5 5  Knee extension 5 5  Ankle dorsiflexion 4+ 5  Ankle plantarflexion    Ankle inversion    Ankle eversion     (Blank rows = not tested) hamstring length  RT :160; LT 150 Quadricep length: heel to buttock RT : 8  LT 5"  FUNCTIONAL TESTS:  30 seconds chair stand test:  11; poor for your age and sex  2 minute walk test: 618 with complaint of knee burning  Single leg stance:  RT: 60"  , LT:   60"  TREATMENT DATE:  04/03/24 Standing:  Heelraise on incline 20X  Lunges onto 6" 2X10 each LE no UE assist  6" lateral step ups 2X10  6" forward step ups 2X10 Tandem stance 2X30" on balance beam foam SLS 30" each LE on balance beam foam Body craft walk outs 5Pl 5X each direction Hamstring stretch 2X30" on 14" step (also shown long sitting) Gastroc stretch 30" on step  03/27/24 Standing:  heelraises 20X  6" lateral step ups 2X10  6" forward step ups and power up 2X10  Lunges onto 6" no UE 2X10 each Body craft walk outs 5 pl 5X each direction Body craft leg press 4 pl 2X10 bil LE's Nustep level 5, 5 minutes LE only   03/21/2024  -SLR 2 x 15 -6in step x 10 with cues for hip abduction and proper knee alignment. -6in lateral stepups x 15without UE support -Standing hip abduction vector 3 x 30'' with RTB at knees.  -Sidestepping 69ft x 4 with RTB -backward sled pull 2x69ft  03/20/24:   Reviewed goals Educated importance of HEP compliance for maximal benefits Pt able to recall:  quad set 10x 5" Bridge 10x 5" hamstring stretch: 1 x with hands behind knee; with rope 2x 30" Sit to stand no HHA eccentric control 10x  Prone: Quad stretch withe rope 3x 30"  Standing: Minisquat front of  chair 10x Forward step up 4in 10x Lateral step up 10x    03/14/24 Evaluation  Sit to stand x 10 Hamstring stretch 30" x 1  Quad set x 5   PATIENT EDUCATION:  Education details: HEP Person educated: Patient Education method: Explanation Education comprehension: verbalized understanding  HOME EXERCISE PROGRAM: Access Code: [facility] URL: [email or web address] Date: 03/14/2024 Prepared by: Virgina Organ  Exercises - Sit to Stand with Pelvic Floor Contraction  - 3 x daily - 7 x weekly - 1 sets - 10 reps - Supine Quadricep Sets  - 10 x daily - 7 x weekly - 1 sets - 10 reps - 5" hold - Supine Hamstring Stretch  - 1 x daily - 7 x weekly - 1 sets - 3 reps - 30" hold - Supine Bridge  - 2 x daily - 7 x weekly - 1 sets - 10 reps - 3-5 hold  ASSESSMENT:  CLINICAL IMPRESSION: Continued with LE strengthening/stabilization exercises.  Began static challenges with ability to complete full 30" with little difficulty on solid surface.  Balance beam foam added to increase challenge but only intermittent finger taps.  Instructed with stretches for hamstring and gastroc to help with reducing tightness/soreness in LE's.  Pt without any pain or issues during or at completion of session today.  Also with less fatigue as compared to last session. Pt will continue to benefit from skilled Physical Therapy services to address deficits/limitations in order to improve functional and QOL.   Eval:  Patient is a 58 y.o. male who was seen today for physical therapy evaluation and treatment for chronic knee pain.  Evaluation demonstrates decreased strength,and increased pain.  Mr. Remer will benefit from skilled PT to address his deficits and improve his functioning ability.   OBJECTIVE IMPAIRMENTS: decreased activity tolerance, decreased strength, and pain.   ACTIVITY LIMITATIONS: carrying, lifting, squatting, stairs, and locomotion level  PARTICIPATION LIMITATIONS: community activity  REHAB  POTENTIAL: Good  CLINICAL DECISION MAKING: Stable/uncomplicated  EVALUATION COMPLEXITY: Low   GOALS: Goals reviewed with patient? No  SHORT TERM GOALS: Target date: 03/28/24 PT to be I in HEP to decrease his  knee pain to no greater than a 4/10 Baseline: Goal status: INITIAL  2.  PT LE strength to be increased 1/2 grade to allow pt to be able to rise from a couch without discomfort.  Baseline:  Goal status: INITIAL   LONG TERM GOALS: Target date: 04/11/24  PT to be I in an advanced HEP to decrease his knee pain to no greater than a 2/10 Baseline:  Goal status: INITIAL  2.  PT LE strength to be increased 1 grade to allow pt to be able to squat and rise without difficulty Baseline:  Goal status: INITIAL  3.  Pt to be able to go up and down 8 steps without pain or difficulty  Baseline:  Goal status: INITIAL   PLAN:  PT FREQUENCY: 2x/week  PT DURATION: 6 weeks  PLANNED INTERVENTIONS: 97110-Therapeutic exercises, 97530- Therapeutic activity, 97112- Neuromuscular re-education, 97535- Self Care, and 62952- Manual therapy  PLAN FOR NEXT SESSION: continue with improving strength, flexibility and decreasing pain.   Lurena Nida, PTA/CLT [facility] Ph: [phone]  04/03/2024, 2:37 PM

## 2024-04-05 ENCOUNTER — Encounter (HOSPITAL_COMMUNITY): Admitting: Physical Therapy

## 2024-04-09 ENCOUNTER — Encounter: Payer: Self-pay | Admitting: Orthopaedic Surgery

## 2024-04-09 ENCOUNTER — Ambulatory Visit: Admitting: Orthopaedic Surgery

## 2024-04-09 ENCOUNTER — Other Ambulatory Visit: Payer: Self-pay

## 2024-04-09 DIAGNOSIS — G8929 Other chronic pain: Secondary | ICD-10-CM

## 2024-04-09 DIAGNOSIS — M25561 Pain in right knee: Secondary | ICD-10-CM

## 2024-04-09 DIAGNOSIS — Z96651 Presence of right artificial knee joint: Secondary | ICD-10-CM

## 2024-04-09 NOTE — Progress Notes (Signed)
 The patient is 10 years out from her right total knee replacement.  This was secondary to his severe posttraumatic arthritis and posttraumatic changes in his right knee from trauma from where he fell off the top of the ladder when he was working on a roof.  He is 58 years old.  Originally he had open reduction/intra fixation of his tibial plateau and eventually hardware removal and a knee replacement.  We saw him in February when he was having a lot of grinding in that knee and some symptoms of instability.  He has been in physical therapy.  He feels like some of the therapy has made things worse.  He does wear a knee stabilizing brace and that is helped some.  Examination of his right knee shows no effusion today.  He has good alignment of the knee but there is a lot of crepitation and clicking and popping between the patella interface and the trochlear groove as I flex and extend his knee.  I feel like there is a little bit of play in the knee ligamentously.  X-rays from February of his right knee showed no complicating features of the knee replacement grossly.  At this point I would like to send him for a three-phase bone scan to rule out prosthetic loosening.  I do feel like he has been need some type of open revision type of surgery to remove scar tissue and potentially upsize his poly liner versus revising the joint completely depending on what the bone scan findings show.  We will have his last 2 therapy sessions canceled and I will see him back in follow-up once we have the bone scan.

## 2024-04-10 ENCOUNTER — Encounter (HOSPITAL_COMMUNITY)

## 2024-04-13 ENCOUNTER — Encounter (HOSPITAL_COMMUNITY)

## 2024-10-29 ENCOUNTER — Encounter: Payer: Self-pay | Admitting: Radiology

## 2025-01-15 ENCOUNTER — Other Ambulatory Visit: Payer: Self-pay | Admitting: Internal Medicine

## 2025-01-15 DIAGNOSIS — F17200 Nicotine dependence, unspecified, uncomplicated: Secondary | ICD-10-CM
